# Patient Record
Sex: Male | Born: 1965 | Race: White | Hispanic: No | Marital: Married | Smoking: Current every day smoker
Health system: Southern US, Community
[De-identification: ages and names within clinical notes are randomized; demographics above are authoritative.]

## PROBLEM LIST (undated history)

## (undated) DIAGNOSIS — Z720 Tobacco use: Secondary | ICD-10-CM

## (undated) DIAGNOSIS — Z923 Personal history of irradiation: Secondary | ICD-10-CM

## (undated) DIAGNOSIS — K219 Gastro-esophageal reflux disease without esophagitis: Secondary | ICD-10-CM

## (undated) DIAGNOSIS — F419 Anxiety disorder, unspecified: Secondary | ICD-10-CM

## (undated) DIAGNOSIS — J449 Chronic obstructive pulmonary disease, unspecified: Secondary | ICD-10-CM

## (undated) DIAGNOSIS — J45909 Unspecified asthma, uncomplicated: Secondary | ICD-10-CM

## (undated) DIAGNOSIS — J40 Bronchitis, not specified as acute or chronic: Secondary | ICD-10-CM

## (undated) HISTORY — PX: TONSILLECTOMY: SUR1361

## (undated) HISTORY — DX: Personal history of irradiation: Z92.3

## (undated) HISTORY — PX: ORIF ACETABULAR FRACTURE: SHX5029

---

## 2000-12-18 ENCOUNTER — Ambulatory Visit (HOSPITAL_COMMUNITY): Admission: RE | Admit: 2000-12-18 | Discharge: 2000-12-18 | Payer: Self-pay | Admitting: Pulmonary Disease

## 2001-10-28 ENCOUNTER — Encounter: Payer: Self-pay | Admitting: Orthopaedic Surgery

## 2001-10-28 ENCOUNTER — Ambulatory Visit (HOSPITAL_COMMUNITY): Admission: RE | Admit: 2001-10-28 | Discharge: 2001-10-28 | Payer: Self-pay | Admitting: Orthopaedic Surgery

## 2002-01-17 ENCOUNTER — Ambulatory Visit (HOSPITAL_COMMUNITY): Admission: RE | Admit: 2002-01-17 | Discharge: 2002-01-17 | Payer: Self-pay | Admitting: Pulmonary Disease

## 2002-07-20 ENCOUNTER — Encounter: Payer: Self-pay | Admitting: Emergency Medicine

## 2002-07-20 ENCOUNTER — Emergency Department (HOSPITAL_COMMUNITY): Admission: EM | Admit: 2002-07-20 | Discharge: 2002-07-21 | Payer: Self-pay | Admitting: Emergency Medicine

## 2003-04-07 ENCOUNTER — Ambulatory Visit (HOSPITAL_COMMUNITY): Admission: RE | Admit: 2003-04-07 | Discharge: 2003-04-07 | Payer: Self-pay | Admitting: Pulmonary Disease

## 2003-11-24 ENCOUNTER — Ambulatory Visit (HOSPITAL_COMMUNITY): Admission: RE | Admit: 2003-11-24 | Discharge: 2003-11-24 | Payer: Self-pay | Admitting: Pulmonary Disease

## 2004-01-28 ENCOUNTER — Emergency Department (HOSPITAL_COMMUNITY): Admission: EM | Admit: 2004-01-28 | Discharge: 2004-01-28 | Payer: Self-pay | Admitting: Emergency Medicine

## 2005-03-21 ENCOUNTER — Ambulatory Visit (HOSPITAL_COMMUNITY): Admission: RE | Admit: 2005-03-21 | Discharge: 2005-03-21 | Payer: Self-pay | Admitting: Pulmonary Disease

## 2005-06-26 ENCOUNTER — Ambulatory Visit (HOSPITAL_COMMUNITY): Admission: RE | Admit: 2005-06-26 | Discharge: 2005-06-26 | Payer: Self-pay | Admitting: Pulmonary Disease

## 2006-06-11 ENCOUNTER — Ambulatory Visit (HOSPITAL_COMMUNITY): Admission: RE | Admit: 2006-06-11 | Discharge: 2006-06-11 | Payer: Self-pay | Admitting: Family Medicine

## 2006-10-21 ENCOUNTER — Ambulatory Visit (HOSPITAL_BASED_OUTPATIENT_CLINIC_OR_DEPARTMENT_OTHER): Admission: RE | Admit: 2006-10-21 | Discharge: 2006-10-21 | Payer: Self-pay | Admitting: Orthopedic Surgery

## 2011-11-01 ENCOUNTER — Emergency Department (HOSPITAL_COMMUNITY): Payer: 59

## 2011-11-01 ENCOUNTER — Emergency Department (HOSPITAL_COMMUNITY)
Admission: EM | Admit: 2011-11-01 | Discharge: 2011-11-01 | Disposition: A | Discharge status: Home / Self Care | Payer: 59 | Attending: Emergency Medicine | Admitting: Emergency Medicine

## 2011-11-01 ENCOUNTER — Encounter (HOSPITAL_COMMUNITY): Payer: Self-pay

## 2011-11-01 DIAGNOSIS — S2239XA Fracture of one rib, unspecified side, initial encounter for closed fracture: Secondary | ICD-10-CM | POA: Insufficient documentation

## 2011-11-01 DIAGNOSIS — F17200 Nicotine dependence, unspecified, uncomplicated: Secondary | ICD-10-CM | POA: Insufficient documentation

## 2011-11-01 DIAGNOSIS — R05 Cough: Secondary | ICD-10-CM | POA: Insufficient documentation

## 2011-11-01 DIAGNOSIS — W2203XA Walked into furniture, initial encounter: Secondary | ICD-10-CM | POA: Insufficient documentation

## 2011-11-01 DIAGNOSIS — R071 Chest pain on breathing: Secondary | ICD-10-CM | POA: Insufficient documentation

## 2011-11-01 DIAGNOSIS — R062 Wheezing: Secondary | ICD-10-CM | POA: Insufficient documentation

## 2011-11-01 DIAGNOSIS — S2232XA Fracture of one rib, left side, initial encounter for closed fracture: Secondary | ICD-10-CM

## 2011-11-01 DIAGNOSIS — R059 Cough, unspecified: Secondary | ICD-10-CM | POA: Insufficient documentation

## 2011-11-01 DIAGNOSIS — W19XXXA Unspecified fall, initial encounter: Secondary | ICD-10-CM | POA: Insufficient documentation

## 2011-11-01 HISTORY — DX: Bronchitis, not specified as acute or chronic: J40

## 2011-11-01 NOTE — Discharge Instructions (Signed)
Rib Fracture Your caregiver has diagnosed you as having a rib fracture (a break). This can occur by a blow to the chest, by a fall against a hard object, or by violent coughing or sneezing. There may be one or many breaks. Rib fractures may heal on their own within 3 to 8 weeks. The longer healing period is usually associated with a continued cough or other aggravating activities. HOME CARE INSTRUCTIONS   Avoid strenuous activity. Be careful during activities and avoid bumping the injured rib. Activities that cause pain pull on the fracture site(s) and are best avoided if possible.   Eat a normal, well-balanced diet. Drink plenty of fluids to avoid constipation.   Take deep breaths several times a day to keep lungs free of infection. Try to cough several times a day, splinting the injured area with a pillow. This will help prevent pneumonia.   Do not wear a rib belt or binder. These restrict breathing which can lead to pneumonia.   Only take over-the-counter or prescription medicines for pain, discomfort, or fever as directed by your caregiver.  SEEK MEDICAL CARE IF:  You develop a continual cough, associated with thick or bloody sputum. SEEK IMMEDIATE MEDICAL CARE IF:   You have a fever.   You have difficulty breathing.   You have nausea (feeling sick to your stomach), vomiting, or abdominal (belly) pain.   You have worsening pain, not controlled with medications.   You have dizziness, faint, bloody urine, bloody stools, cough up blood, or other concerns. Document Released: 08/11/2005 Document Revised: 07/31/2011 Document Reviewed: 01/13/2007 [employer] Patient Information 2012 [place], [place].

## 2011-11-01 NOTE — ED Provider Notes (Signed)
History   This chart was scribed for Hurman Horn, MD by Melba Coon. The patient was seen in room APA06/APA06 and the patient's care was started at 10:40PM.     CSN: [id number]  Arrival date & time 11/01/11  1001   First MD Initiated Contact with Patient 11/01/11 1038      Chief Complaint  Patient presents with  . Chest Injury    (Consider location/radiation/quality/duration/timing/severity/associated sxs/prior treatment) HPI James Serrano is a 46 y.o. male who presents to the Emergency Department complaining of constant, moderate to severe left-sided chest pain around the left ribs with an onset this morning pertaining to a fall. Pt was told he had pneumonia 4 weeks ago and is on his second round of antibiotics. Pt has also had prednisone. This morning when pt got out of bed, he was coughing so hard that he accidentally hit his chest on a dresser; no LOC. Pt states it hurts to breathe but there is no SOB. No HA, neck pain, back pain, abd pain, or extremity, numbness, tingling, or edema. No known allergies. No other pertinent medical problems.  Past Medical History  Diagnosis Date  . Bronchitis     History reviewed. No pertinent past surgical history.  No family history on file.  History  Substance Use Topics  . Smoking status: Current Everyday Smoker  . Smokeless tobacco: Not on file  . Alcohol Use: Yes     occasionally     Review of Systems 10 Systems reviewed and are negative for acute change except as noted in the HPI.  Allergies  Review of patient's allergies indicates no known allergies.  Home Medications   Current Outpatient Rx  Name Route Sig Dispense Refill  . ALBUTEROL SULFATE HFA 108 (90 BASE) MCG/ACT IN AERS Inhalation Inhale 2 puffs into the lungs every 6 (six) hours as needed. For shortness of breath    . LEVOFLOXACIN 500 MG PO TABS Oral Take 500 mg by mouth daily.      BP 134/83  Pulse 73  Temp(Src) 98.1 F (36.7 C) (Oral)  Resp 21  Ht 6'  (1.829 m)  Wt 260 lb (117.935 kg)  BMI 35.26 kg/m2  SpO2 97%  Physical Exam  Nursing note and vitals reviewed. Constitutional: He is oriented to person, place, and time. He appears well-developed and well-nourished. No distress.       Awake, alert, nontoxic appearance. Pt is standing at time of exam.  HENT:  Head: Normocephalic and atraumatic.  Eyes: EOM are normal. Pupils are equal, round, and reactive to light. Right eye exhibits no discharge. Left eye exhibits no discharge.  Neck: Normal range of motion. Neck supple.  Cardiovascular: Normal rate, regular rhythm and normal heart sounds.   Pulmonary/Chest: Effort normal. He has wheezes (End-expiratory; bilaterally at the bases). He exhibits no tenderness.  Abdominal: Soft. Bowel sounds are normal. There is no tenderness. There is no rebound and no guarding.  Musculoskeletal: Normal range of motion. He exhibits tenderness (Tenderness in the left parasternal, lower anterior chest wall without deformity. ). He exhibits no edema.       Baseline ROM, no obvious new focal weakness.  Neurological: He is alert and oriented to person, place, and time.       Mental status and motor strength appears baseline for patient and situation.  Skin: Skin is warm and dry. No rash noted.  Psychiatric: He has a normal mood and affect. His behavior is normal.    ED Course  Procedures (including critical care time)  DIAGNOSTIC STUDIES: Oxygen Saturation is 97% on room air, normal by my interpretation.    COORDINATION OF CARE:  10:45AM - EDMD will order CXR to exclude collapsed lung.  Labs Reviewed - No data to display Dg Chest 2 View  11/01/2011  *RADIOLOGY REPORT*  Clinical Data: 46 year old male with chest pain following injury.  CHEST - 2 VIEW  Comparison: 06/26/2005  Findings: The cardiomediastinal silhouette is unremarkable. COPD/emphysema noted. There is no evidence of focal airspace disease, pulmonary edema, suspicious pulmonary nodule/mass,  pleural effusion, or pneumothorax. No acute bony abnormalities are identified.  IMPRESSION: COPD/emphysema without evidence of acute cardiopulmonary disease.  Original Report Authenticated By: Rosendo Gros, M.D.     1. Left rib fracture       MDM  I personally performed the services described in this documentation, which was scribed in my presence. The recorded information has been reviewed and considered.       Hurman Horn, MD 11/01/11 2034

## 2011-11-01 NOTE — ED Notes (Addendum)
edp in for evaluation

## 2011-11-01 NOTE — ED Notes (Signed)
Pt reports reports had pneumonia 4 weeks ago  And is on his second round of antibiotics.  Reports pt had a coughing spell and was dizzy when he got out of bed.  Says he lost his balance and fell into a dresser.  Pt c/o pain in left ribs.    Says did not lose consciousness.

## 2013-02-13 ENCOUNTER — Other Ambulatory Visit: Payer: Self-pay | Admitting: Family Medicine

## 2013-02-15 ENCOUNTER — Encounter: Payer: Self-pay | Admitting: *Deleted

## 2013-04-01 ENCOUNTER — Encounter: Payer: Self-pay | Admitting: Family Medicine

## 2013-04-01 ENCOUNTER — Ambulatory Visit (INDEPENDENT_AMBULATORY_CARE_PROVIDER_SITE_OTHER): Payer: BC Managed Care – PPO | Admitting: Family Medicine

## 2013-04-01 VITALS — BP 130/90 | HR 80 | Ht 72.0 in | Wt 262.0 lb

## 2013-04-01 DIAGNOSIS — R05 Cough: Secondary | ICD-10-CM

## 2013-04-01 DIAGNOSIS — Z0000 Encounter for general adult medical examination without abnormal findings: Secondary | ICD-10-CM

## 2013-04-01 DIAGNOSIS — R059 Cough, unspecified: Secondary | ICD-10-CM

## 2013-04-01 MED ORDER — BUPROPION HCL ER (SR) 150 MG PO TB12: 150.0000 mg | ORAL_TABLET | Freq: Two times a day (BID) | ORAL | Status: DC

## 2013-04-01 MED ORDER — ALBUTEROL SULFATE HFA 108 (90 BASE) MCG/ACT IN AERS: INHALATION_SPRAY | RESPIRATORY_TRACT | Status: DC

## 2013-04-01 NOTE — Patient Instructions (Signed)
DASH Diet  The DASH diet stands for "Dietary Approaches to Stop Hypertension." It is a healthy eating plan that has been shown to reduce high blood pressure (hypertension) in as little as 14 days, while also possibly providing other significant health benefits. These other health benefits include reducing the risk of breast cancer after menopause and reducing the risk of type 2 diabetes, heart disease, colon cancer, and stroke. Health benefits also include weight loss and slowing kidney failure in patients with chronic kidney disease.   DIET GUIDELINES  · Limit salt (sodium). Your diet should contain less than 1500 mg of sodium daily.  · Limit refined or processed carbohydrates. Your diet should include mostly whole grains. Desserts and added sugars should be used sparingly.  · Include small amounts of heart-healthy fats. These types of fats include nuts, oils, and tub margarine. Limit saturated and trans fats. These fats have been shown to be harmful in the body.  CHOOSING FOODS   The following food groups are based on a 2000 calorie diet. See your Registered Dietitian for individual calorie needs.  Grains and Grain Products (6 to 8 servings daily)  · Eat More Often: Whole-wheat bread, brown rice, whole-grain or wheat pasta, quinoa, popcorn without added fat or salt (air popped).  · Eat Less Often: White bread, white pasta, white rice, cornbread.  Vegetables (4 to 5 servings daily)  · Eat More Often: Fresh, frozen, and canned vegetables. Vegetables may be raw, steamed, roasted, or grilled with a minimal amount of fat.  · Eat Less Often/Avoid: Creamed or fried vegetables. Vegetables in a cheese sauce.  Fruit (4 to 5 servings daily)  · Eat More Often: All fresh, canned (in natural juice), or frozen fruits. Dried fruits without added sugar. One hundred percent fruit juice (½ cup [237 mL] daily).  · Eat Less Often: Dried fruits with added sugar. Canned fruit in light or heavy syrup.  Lean Meats, Fish, and Poultry (2  servings or less daily. One serving is 3 to 4 oz [85-114 g]).  · Eat More Often: Ninety percent or leaner ground beef, tenderloin, sirloin. Round cuts of beef, chicken breast, turkey breast. All fish. Grill, bake, or broil your meat. Nothing should be fried.  · Eat Less Often/Avoid: Fatty cuts of meat, turkey, or chicken leg, thigh, or wing. Fried cuts of meat or fish.  Dairy (2 to 3 servings)  · Eat More Often: Low-fat or fat-free milk, low-fat plain or light yogurt, reduced-fat or part-skim cheese.  · Eat Less Often/Avoid: Milk (whole, 2%). Whole milk yogurt. Full-fat cheeses.  Nuts, Seeds, and Legumes (4 to 5 servings per week)  · Eat More Often: All without added salt.  · Eat Less Often/Avoid: Salted nuts and seeds, canned beans with added salt.  Fats and Sweets (limited)  · Eat More Often: Vegetable oils, tub margarines without trans fats, sugar-free gelatin. Mayonnaise and salad dressings.  · Eat Less Often/Avoid: Coconut oils, palm oils, butter, stick margarine, cream, half and half, cookies, candy, pie.  FOR MORE INFORMATION  The Dash Diet Eating Plan: www.dashdiet.org  Document Released: 07/31/2011 Document Revised: 11/03/2011 Document Reviewed: 07/31/2011  [employer]® Patient Information ©2014 [employer].

## 2013-04-01 NOTE — Progress Notes (Signed)
  Subjective:    Patient ID: James Serrano, male    DOB: 04-Nov-1965, 47 y.o.   MRN: [id number]  HPI Patient is here today for his annual wellness exam. He states that his overall health is doing very well. He wants to discuss getting a medication to help with his nerves. Patient admits that he can do a better job at eating healthier. He also states to be more active. He does get some shortness of breath with activity. Denies chest pressure tightness pain patient has extensive smoking history back in 2007 he had a CAT scan which showed a small pulmonary nodule but it was stable compared one in 2006 he does have some coughing no hemoptysis. No  FMH of colon or prostate cancer Dad with lung cancer Pt tries chantix without help had side effects Has some cough Smoking history    She denies rectal bleeding. Family history reviewed Review of Systems  Constitutional: Negative for fever, activity change and appetite change.  HENT: Negative for congestion, rhinorrhea and neck pain.   Eyes: Negative for discharge.  Respiratory: Positive for cough. Negative for wheezing.   Cardiovascular: Negative for chest pain.  Gastrointestinal: Negative for vomiting, abdominal pain and blood in stool.  Genitourinary: Negative for frequency and difficulty urinating.  Skin: Negative for rash.  Allergic/Immunologic: Negative for environmental allergies and food allergies.  Neurological: Negative for weakness and headaches.  Psychiatric/Behavioral: Negative for agitation.      see above Objective:   Physical Exam  Vitals reviewed. Constitutional: He appears well-developed and well-nourished.  HENT:  Head: Normocephalic and atraumatic.  Right Ear: External ear normal.  Left Ear: External ear normal.  Nose: Nose normal.  Mouth/Throat: Oropharynx is clear and moist.  Eyes: EOM are normal. Pupils are equal, round, and reactive to light.  Neck: Normal range of motion. Neck supple. No thyromegaly present.   Cardiovascular: Normal rate, regular rhythm and normal heart sounds.   No murmur heard. Pulmonary/Chest: Effort normal and breath sounds normal. No respiratory distress. He has no wheezes.  Abdominal: Soft. Bowel sounds are normal. He exhibits no distension and no mass. There is no tenderness.  Genitourinary: Penis normal.  Musculoskeletal: Normal range of motion. He exhibits no edema.  Lymphadenopathy:    He has no cervical adenopathy.  Neurological: He is alert. He exhibits normal muscle tone.  Skin: Skin is warm and dry. No erythema.  Psychiatric: He has a normal mood and affect. His behavior is normal. Judgment normal.          Assessment & Plan:  #1 probable COPD. I told the patient best he could use quit smoking. Albuterol as needed. If breathing problems become worse next step is full pulmonary function testing #2 cough-probably at true smokers cough chest x-ray warned to it. Probably when patient turns 48 he will need a CT scan #3 smoking cessation discussed multiple ways of doing this he will go with Wellbutrin SR twice daily to see if this will help his stress levels plus also help him quit smoking #4 overall wellness good safety measures discussed patient encouraged to lose weight exercise watch diet quit smoking he would check his lab work await the results

## 2013-04-07 ENCOUNTER — Telehealth: Payer: Self-pay | Admitting: Family Medicine

## 2013-04-07 NOTE — Telephone Encounter (Signed)
Left message on voicemail notifying patient that medications were sent in last week and they were sent to [facility] pharmacy.

## 2013-04-07 NOTE — Telephone Encounter (Signed)
Patient came in last week and was supposed to have medications called in, but pharmacy states they do not have it.    [facility] in [place]

## 2013-05-30 ENCOUNTER — Encounter: Payer: Self-pay | Admitting: Family Medicine

## 2013-05-30 ENCOUNTER — Telehealth: Payer: Self-pay | Admitting: Family Medicine

## 2013-05-30 ENCOUNTER — Ambulatory Visit (INDEPENDENT_AMBULATORY_CARE_PROVIDER_SITE_OTHER): Payer: BC Managed Care – PPO | Admitting: Family Medicine

## 2013-05-30 VITALS — BP 128/86 | Temp 98.4°F | Ht 72.0 in | Wt 262.8 lb

## 2013-05-30 DIAGNOSIS — J209 Acute bronchitis, unspecified: Secondary | ICD-10-CM

## 2013-05-30 MED ORDER — LEVOFLOXACIN 500 MG PO TABS: 500.0000 mg | ORAL_TABLET | Freq: Every day | ORAL | Status: AC

## 2013-05-30 MED ORDER — PREDNISONE 20 MG PO TABS: ORAL_TABLET | ORAL | Status: AC

## 2013-05-30 NOTE — Progress Notes (Signed)
  Subjective:    Patient ID: James Serrano, male    DOB: 05/25/1966, 47 y.o.   MRN: [id number]  HPI Patient arrives with cough, headache, body aches and wheezing for few days. Symptoms been going on for a few days. Denies shortness of breath. He does smoke he knows needs quit. He has been using his inhaler. He relates cough congestion no vomiting or diarrhea Has had some history of reactive airway Review of Systems See above    Objective:   Physical Exam  Lungs bilateral expiratory wheezes not respiratory distress heart regular abdomen soft extremities no edema skin warm dry      Assessment & Plan:  Reactive airway-albuterol when necessary prednisone taper #2 bronchitis Levaquin 10 days Educated patient regarding warning signs of pneumonia if worse followup immediately

## 2013-05-30 NOTE — Telephone Encounter (Signed)
Left message on voicemail notifying patient that xray order is still in the system and can report to [facility] to have this completed.

## 2013-05-30 NOTE — Telephone Encounter (Signed)
Patient had a past xray from August-but never got order filled. He would like to get paperwork for this.

## 2013-09-21 ENCOUNTER — Telehealth: Payer: Self-pay | Admitting: Family Medicine

## 2013-09-21 MED ORDER — CEPHALEXIN 500 MG PO CAPS: 500.0000 mg | ORAL_CAPSULE | Freq: Four times a day (QID) | ORAL | Status: DC

## 2013-09-21 NOTE — Telephone Encounter (Signed)
Please call in to [facility]

## 2013-09-21 NOTE — Telephone Encounter (Signed)
Use keflex 500 one qid for 7 days He should schedule ov for tag removal in the near future

## 2013-09-21 NOTE — Telephone Encounter (Signed)
Talked with wife. Advised to come in for appt. He is working out of town til Friday. Wife states it is not infected, no fever. Skin tag is hanging off. Area is red around the sight.

## 2013-09-21 NOTE — Telephone Encounter (Signed)
Med sent to pharm. Wife notified.

## 2013-09-21 NOTE — Telephone Encounter (Signed)
Pt's wife to call back with name of pharm to call in antibiotic and to schedule appt for skin tag removal.

## 2013-09-21 NOTE — Telephone Encounter (Signed)
Patient has got skin tags. 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 His wife is calling for advice on how to care for this.

## 2013-09-30 ENCOUNTER — Encounter: Payer: Self-pay | Admitting: Family Medicine

## 2013-09-30 ENCOUNTER — Ambulatory Visit (INDEPENDENT_AMBULATORY_CARE_PROVIDER_SITE_OTHER): Payer: BC Managed Care – PPO | Admitting: Family Medicine

## 2013-09-30 VITALS — BP 122/78 | Ht 72.0 in | Wt 268.0 lb

## 2013-09-30 DIAGNOSIS — L919 Hypertrophic disorder of the skin, unspecified: Secondary | ICD-10-CM

## 2013-09-30 DIAGNOSIS — L918 Other hypertrophic disorders of the skin: Secondary | ICD-10-CM

## 2013-09-30 DIAGNOSIS — L909 Atrophic disorder of skin, unspecified: Secondary | ICD-10-CM

## 2013-09-30 MED ORDER — CIPROFLOXACIN HCL 500 MG PO TABS: 500.0000 mg | ORAL_TABLET | Freq: Two times a day (BID) | ORAL | Status: AC

## 2013-09-30 NOTE — Progress Notes (Signed)
   Subjective:    Patient ID: James Serrano, male    DOB: 03/20/1966, 48 y.o.   MRN: [id number]  HPI Patient arrives with a skin tag in groin area. Patient wears safety harness at work and it irritated the skin tag and now it has turned black and is sore. Denies any per previous trouble. He has had this for several years. It's just gotten larger with time and now gets in the way.  Review of Systems No fevers no drainage    Objective:   Physical Exam  There is a large pedunculated skin tag growth. Does not appear cancerous. Does have some localized infection. No abscess seen.  With his consent this area was numbed with lidocaine then was removed with a #15 blade. 2 sutures were placed in sterile fashion.      Assessment & Plan:  Pedunculated skin tag removal. Followup 10 days for suture removal.  Localized infection antibiotics prescribed warning signs discussed

## 2013-10-10 ENCOUNTER — Ambulatory Visit: Payer: BC Managed Care – PPO | Admitting: Family Medicine

## 2013-10-10 ENCOUNTER — Encounter: Payer: Self-pay | Admitting: Family Medicine

## 2013-10-10 VITALS — BP 130/80 | Ht 72.0 in | Wt 266.4 lb

## 2013-10-10 DIAGNOSIS — J069 Acute upper respiratory infection, unspecified: Secondary | ICD-10-CM

## 2013-10-10 MED ORDER — AZITHROMYCIN 250 MG PO TABS: ORAL_TABLET | ORAL | Status: DC

## 2013-10-10 NOTE — Progress Notes (Signed)
   Subjective:    Patient ID: James Serrano, male    DOB: [birth year], 48 y.o.   MRN: [id number]  HPI Patient is here today for suture removal on a skin tag that was removed on 09/30/2013.   Patient states that he has yellow colored congestion and cough that has been present for 2 days now.  He denies high fevers wheezing difficulty breathing he just relates congestion cough sinus pressure symptoms over the past few days.  Review of Systems No other problems.    Objective:   Physical Exam Nostrils normal throat normal neck supple lungs clear  Area on the left leg were skin tag was removed looks fine small suture removed without difficulty     Assessment & Plan:  Result surgical site.  Viral upper rest revealed [place] if progressive over the next couple days Zithromax 5 days as directed warnings discussed patient was encouraged to quit smoking, also encouraged to get lab work in wellness exam later this year

## 2013-10-12 ENCOUNTER — Telehealth: Payer: Self-pay | Admitting: Family Medicine

## 2013-10-12 MED ORDER — BENZONATATE 200 MG PO CAPS: 200.0000 mg | ORAL_CAPSULE | Freq: Three times a day (TID) | ORAL | Status: DC | PRN

## 2013-10-12 NOTE — Telephone Encounter (Signed)
Patient was seen on 2/13 but cant seem to get over bad cough. He has tried over the counter cough medicine not helping can you prescribe a cough medicine. Call into [facility] [place].

## 2013-10-12 NOTE — Addendum Note (Signed)
Addended by: Carmelina Noun on: 10/12/2013 01:27 PM   Modules accepted: Orders

## 2013-10-12 NOTE — Telephone Encounter (Signed)
Tessalon 200 mg 1 3 times a day when necessary cough, #21, followup if ongoing

## 2013-10-12 NOTE — Telephone Encounter (Signed)
Med sent to pharm. Pt notified on voicemail.  

## 2013-12-02 ENCOUNTER — Telehealth: Payer: Self-pay | Admitting: Family Medicine

## 2013-12-02 NOTE — Telephone Encounter (Signed)
Chart in message pile

## 2013-12-02 NOTE — Telephone Encounter (Signed)
I will need his chart.

## 2013-12-02 NOTE — Telephone Encounter (Signed)
Patients life insurance policy is requiring that we compile a list of medications that patient has been on in 2014 and 2015 and why he was on those medications.

## 2013-12-06 NOTE — Telephone Encounter (Signed)
Patients wife called about this today. She needs this ASAP.

## 2013-12-07 NOTE — Telephone Encounter (Signed)
A letter was dictated regarding all of this please give a copy to the patient.

## 2013-12-07 NOTE — Telephone Encounter (Signed)
A letter was dictated regarding all of this

## 2013-12-08 NOTE — Telephone Encounter (Signed)
Notified patients wife that it was ready.

## 2014-02-12 ENCOUNTER — Emergency Department (HOSPITAL_COMMUNITY): Payer: BC Managed Care – PPO

## 2014-02-12 ENCOUNTER — Encounter (HOSPITAL_COMMUNITY): Payer: Self-pay | Admitting: Emergency Medicine

## 2014-02-12 ENCOUNTER — Observation Stay (HOSPITAL_COMMUNITY)
Admission: EM | Admit: 2014-02-12 | Discharge: 2014-02-13 | Disposition: A | Discharge status: Home / Self Care | Payer: BC Managed Care – PPO | Attending: Internal Medicine | Admitting: Internal Medicine

## 2014-02-12 DIAGNOSIS — F1910 Other psychoactive substance abuse, uncomplicated: Secondary | ICD-10-CM | POA: Insufficient documentation

## 2014-02-12 DIAGNOSIS — F17200 Nicotine dependence, unspecified, uncomplicated: Secondary | ICD-10-CM | POA: Insufficient documentation

## 2014-02-12 DIAGNOSIS — Z79899 Other long term (current) drug therapy: Secondary | ICD-10-CM | POA: Insufficient documentation

## 2014-02-12 DIAGNOSIS — R197 Diarrhea, unspecified: Secondary | ICD-10-CM | POA: Insufficient documentation

## 2014-02-12 DIAGNOSIS — R42 Dizziness and giddiness: Secondary | ICD-10-CM | POA: Insufficient documentation

## 2014-02-12 DIAGNOSIS — M6281 Muscle weakness (generalized): Secondary | ICD-10-CM | POA: Insufficient documentation

## 2014-02-12 DIAGNOSIS — R5383 Other fatigue: Secondary | ICD-10-CM

## 2014-02-12 DIAGNOSIS — Z720 Tobacco use: Secondary | ICD-10-CM

## 2014-02-12 DIAGNOSIS — R5381 Other malaise: Secondary | ICD-10-CM | POA: Insufficient documentation

## 2014-02-12 DIAGNOSIS — K219 Gastro-esophageal reflux disease without esophagitis: Secondary | ICD-10-CM

## 2014-02-12 DIAGNOSIS — R209 Unspecified disturbances of skin sensation: Secondary | ICD-10-CM | POA: Insufficient documentation

## 2014-02-12 DIAGNOSIS — J441 Chronic obstructive pulmonary disease with (acute) exacerbation: Secondary | ICD-10-CM | POA: Insufficient documentation

## 2014-02-12 DIAGNOSIS — R0789 Other chest pain: Principal | ICD-10-CM | POA: Insufficient documentation

## 2014-02-12 DIAGNOSIS — R112 Nausea with vomiting, unspecified: Secondary | ICD-10-CM | POA: Insufficient documentation

## 2014-02-12 DIAGNOSIS — J449 Chronic obstructive pulmonary disease, unspecified: Secondary | ICD-10-CM

## 2014-02-12 DIAGNOSIS — R079 Chest pain, unspecified: Secondary | ICD-10-CM

## 2014-02-12 DIAGNOSIS — F1010 Alcohol abuse, uncomplicated: Secondary | ICD-10-CM

## 2014-02-12 HISTORY — DX: Tobacco use: Z72.0

## 2014-02-12 LAB — CBC WITH DIFFERENTIAL/PLATELET
Basophils Absolute: 0 10*3/uL (ref 0.0–0.1)
Basophils Relative: 0 % (ref 0–1)
Eosinophils Absolute: 0.3 10*3/uL (ref 0.0–0.7)
Eosinophils Relative: 5 % (ref 0–5)
HCT: 48.2 % (ref 39.0–52.0)
Hemoglobin: 16.9 g/dL (ref 13.0–17.0)
Lymphocytes Relative: 26 % (ref 12–46)
Lymphs Abs: 1.4 10*3/uL (ref 0.7–4.0)
MCH: 33.1 pg (ref 26.0–34.0)
MCHC: 35.1 g/dL (ref 30.0–36.0)
MCV: 94.3 fL (ref 78.0–100.0)
Monocytes Absolute: 0.5 10*3/uL (ref 0.1–1.0)
Monocytes Relative: 8 % (ref 3–12)
Neutro Abs: 3.2 10*3/uL (ref 1.7–7.7)
Neutrophils Relative %: 61 % (ref 43–77)
Platelets: 166 10*3/uL (ref 150–400)
RBC: 5.11 MIL/uL (ref 4.22–5.81)
RDW: 12.6 % (ref 11.5–15.5)
WBC: 5.4 10*3/uL (ref 4.0–10.5)

## 2014-02-12 LAB — TROPONIN I
Troponin I: <0.3 ng/mL (ref <0.30)
Troponin I: <0.3 ng/mL (ref <0.30)
Troponin I: <0.3 ng/mL (ref <0.30)

## 2014-02-12 LAB — BASIC METABOLIC PANEL
BUN: 8 mg/dL (ref 6–23)
CO2: 27 mEq/L (ref 19–32)
Calcium: 9.6 mg/dL (ref 8.4–10.5)
Chloride: 99 mEq/L (ref 96–112)
Creatinine, Ser: 0.84 mg/dL (ref 0.50–1.35)
GFR calc Af Amer: >90 mL/min (ref >90)
GFR calc non Af Amer: >90 mL/min (ref >90)
Glucose, Bld: 106 mg/dL — ABNORMAL HIGH (ref 70–99)
Potassium: 4.3 mEq/L (ref 3.7–5.3)
Sodium: 139 mEq/L (ref 137–147)

## 2014-02-12 LAB — CBC
HCT: 48.1 % (ref 39.0–52.0)
Hemoglobin: 16.9 g/dL (ref 13.0–17.0)
MCH: 33.5 pg (ref 26.0–34.0)
MCHC: 35.1 g/dL (ref 30.0–36.0)
MCV: 95.4 fL (ref 78.0–100.0)
Platelets: 167 10*3/uL (ref 150–400)
RBC: 5.04 MIL/uL (ref 4.22–5.81)
RDW: 12.7 % (ref 11.5–15.5)
WBC: 5.9 10*3/uL (ref 4.0–10.5)

## 2014-02-12 LAB — TSH: TSH: 1.26 u[IU]/mL (ref 0.350–4.500)

## 2014-02-12 LAB — MAGNESIUM: Magnesium: 2.3 mg/dL (ref 1.5–2.5)

## 2014-02-12 LAB — PHOSPHORUS: Phosphorus: 3.6 mg/dL (ref 2.3–4.6)

## 2014-02-12 MED ORDER — ZOLPIDEM TARTRATE 5 MG PO TABS
5.0000 mg | ORAL_TABLET | Freq: Once | ORAL | Status: AC
  Administered 2014-02-12: 5 mg via ORAL
  Filled 2014-02-12: qty 1

## 2014-02-12 MED ORDER — SIMETHICONE 80 MG PO CHEW
160.0000 mg | CHEWABLE_TABLET | Freq: Four times a day (QID) | ORAL | Status: DC | PRN
  Administered 2014-02-12 – 2014-02-13 (×2): 160 mg via ORAL
  Filled 2014-02-12 (×2): qty 2

## 2014-02-12 MED ORDER — BUPROPION HCL ER (SR) 150 MG PO TB12
150.0000 mg | ORAL_TABLET | Freq: Two times a day (BID) | ORAL | Status: DC
  Filled 2014-02-12 (×6): qty 1

## 2014-02-12 MED ORDER — FAMOTIDINE IN NACL 20-0.9 MG/50ML-% IV SOLN
20.0000 mg | Freq: Once | INTRAVENOUS | Status: AC
  Administered 2014-02-12: 20 mg via INTRAVENOUS
  Filled 2014-02-12: qty 50

## 2014-02-12 MED ORDER — GI COCKTAIL ~~LOC~~
30.0000 mL | Freq: Once | ORAL | Status: AC
  Administered 2014-02-12: 30 mL via ORAL
  Filled 2014-02-12: qty 30

## 2014-02-12 MED ORDER — FAMOTIDINE 20 MG PO TABS
20.0000 mg | ORAL_TABLET | Freq: Two times a day (BID) | ORAL | Status: DC
  Administered 2014-02-12 – 2014-02-13 (×2): 20 mg via ORAL
  Filled 2014-02-12 (×2): qty 1

## 2014-02-12 MED ORDER — ASPIRIN EC 81 MG PO TBEC
81.0000 mg | DELAYED_RELEASE_TABLET | Freq: Every day | ORAL | Status: DC
  Administered 2014-02-12 – 2014-02-13 (×2): 81 mg via ORAL
  Filled 2014-02-12 (×2): qty 1

## 2014-02-12 MED ORDER — ENOXAPARIN SODIUM 40 MG/0.4ML ~~LOC~~ SOLN
40.0000 mg | SUBCUTANEOUS | Status: DC
  Administered 2014-02-12: 40 mg via SUBCUTANEOUS
  Filled 2014-02-12: qty 0.4

## 2014-02-12 MED ORDER — ALBUTEROL SULFATE (2.5 MG/3ML) 0.083% IN NEBU
3.0000 mL | INHALATION_SOLUTION | RESPIRATORY_TRACT | Status: DC | PRN
  Administered 2014-02-12: 3 mL via RESPIRATORY_TRACT
  Filled 2014-02-12: qty 3

## 2014-02-12 NOTE — H&P (Signed)
[facility] History and Physical  James Serrano:[id number] DOB: 1965/12/25 DOA: 02/12/2014  Referring physician:  PCP: James Lange, MD  Specialists:   Chief Complaint: Chest pain.  HPI: James Serrano is a 48 y.o. male  With a history of COPD, nicotine abuse that presents to the emergency department with complaints of chest pain. Patient states his chest pain started yesterday evening and has been intermittent. Patient states that the pain is left-sided and penetrates straight to his shoulder blade. The pain lasts a few seconds. It is associated with some nausea. Patient denies any diaphoresis, vomiting, dizziness. Patient does have some shortness of breath but states that this is constant for him. Patient states that the pain is pressure-like in nature and rates as a 5/10 at its worst. Patient has not tried taking any aspirin for his pain. The pain usually subsides on its own. The pain did awaken him from sleep yesterday evening. Patient thought that his pain was related to gas and indigestion.  Patient states his father passed away at age 29 from a heart attack. Patient does admit to smoking approximately one pack of cigarettes per day. Patient denies any recent ill contacts or travel.  Review of Systems:  Constitutional: Denies fever, chills, diaphoresis, appetite change and fatigue.  HEENT: Denies photophobia, eye pain, redness, hearing loss, ear pain, congestion, sore throat, rhinorrhea, sneezing, mouth sores, trouble swallowing, neck pain, neck stiffness and tinnitus.   Respiratory: Patient has some shortness of breath, however constant.  Cardiovascular: Patient complains of chest pain. Gastrointestinal: Complains of nausea associated with his chest pain. Genitourinary: Denies dysuria, urgency, frequency, hematuria, flank pain and difficulty urinating.  Musculoskeletal: Denies myalgias, back pain, joint swelling, arthralgias and gait problem.  Skin: Denies pallor, rash and  wound.  Neurological: Denies dizziness, seizures, syncope, weakness, light-headedness, numbness and headaches.  Hematological: Denies adenopathy. Easy bruising, personal or family bleeding history  Psychiatric/Behavioral: Denies suicidal ideation, mood changes, confusion, nervousness, sleep disturbance and agitation  Past Medical History  Diagnosis Date  . Bronchitis    History reviewed. No pertinent past surgical history. Social History:  reports that he has been smoking Cigarettes.  He has been smoking about 1.00 pack per day. He does not have any smokeless tobacco history on file. He reports that he drinks alcohol. He reports that he does not use illicit drugs.   No Known Allergies  Family History  Father died in his 97s of an MI.  Brother died of some type of cancer, possibly bone.  Prior to Admission medications   Medication Sig Start Date End Date Taking? Authorizing Provider  albuterol (VENTOLIN HFA) 108 (90 BASE) MCG/ACT inhaler INHALE TWO PUFFS INTO LUNGS EVERY 4 HOURS AS NEEDED FOR  WHEEZING 04/01/13   Kathyrn Drown, MD  azithromycin (ZITHROMAX Z-PAK) 250 MG tablet Take 2 tablets (500 mg) on  Day 1,  followed by 1 tablet (250 mg) once daily on Days 2 through 5. 10/10/13   Kathyrn Drown, MD  benzonatate (TESSALON) 200 MG capsule Take 1 capsule (200 mg total) by mouth 3 (three) times daily as needed for cough. 10/12/13   Kathyrn Drown, MD  buPROPion (WELLBUTRIN SR) 150 MG 12 hr tablet Take 1 tablet (150 mg total) by mouth 2 (two) times daily. 04/01/13 04/01/14  Kathyrn Drown, MD   Physical Exam: Filed Vitals:   02/12/14 1300  BP: 125/87  Pulse: 69  Temp:   Resp: 15     General: Well developed, well nourished,  NAD, appears stated age  25: NCAT, PERRLA, EOMI, Anicteic Sclera, mucous membranes moist.   Neck: Supple, no JVD, no masses  Cardiovascular: S1 S2 auscultated, no rubs, murmurs or gallops. Regular rate and rhythm.  Respiratory: Diffuse wheezing.  Abdomen:  Soft, obese, nontender, nondistended, + bowel sounds  Extremities: warm dry without cyanosis clubbing or edema  Neuro: AAOx3, cranial nerves grossly intact. Strength 5/5 in patient's upper and lower extremities bilaterally  Skin: Without rashes exudates or nodules  Psych: Normal affect and demeanor with intact judgement and insight  Labs on Admission:  Basic Metabolic Panel:  Recent Labs Lab 02/12/14 1131  NA 139  K 4.3  CL 99  CO2 27  GLUCOSE 106*  BUN 8  CREATININE 0.84  CALCIUM 9.6   Liver Function Tests: No results found for this basename: AST, ALT, ALKPHOS, BILITOT, PROT, ALBUMIN,  in the last 168 hours No results found for this basename: LIPASE, AMYLASE,  in the last 168 hours No results found for this basename: AMMONIA,  in the last 168 hours CBC:  Recent Labs Lab 02/12/14 1131  WBC 5.4  NEUTROABS 3.2  HGB 16.9  HCT 48.2  MCV 94.3  PLT 166   Cardiac Enzymes:  Recent Labs Lab 02/12/14 1131  TROPONINI <0.30    BNP (last 3 results) No results found for this basename: PROBNP,  in the last 8760 hours CBG: No results found for this basename: GLUCAP,  in the last 168 hours  Radiological Exams on Admission: Dg Chest 2 View  02/12/2014   CLINICAL DATA:  Chest pain for 2 days, smoking history  EXAM: CHEST  2 VIEW  COMPARISON:  11/01/2011  FINDINGS: Hyperinflation consistent with COPD. Heart size upper normal and stable. Vascular pattern normal. No consolidation effusion or pneumothorax.  IMPRESSION: COPD with no acute findings   Electronically Signed   By: Skipper Cliche M.D.   On: 02/12/2014 11:47    EKG: Independently reviewed. Sinus rhythm, rate 71  Assessment/Plan  Chest pain rule out acute coronary syndrome -Patient be admitted to telemetry unit for observation -Will cycle cardiac troponins -Will place patient on daily aspirin -Patient is obese, smoker, has family history of coronary artery disease, making him high risk for coronary artery  disease -Will consult cardiology for further intervention and management and possibly stress testing -Will obtain TSH, magnesium, phosphate, fasting lipid panel  Nicotine abuse -Patient counseled on smoking cessation  COPD -Currently not in exacerbation -Chest x-ray shows COPD with no acute findings  Alcohol abuse -Patient drinks 1 sixpack per night -Patient was counseled -Will monitor for any signs of withdrawal.  DVT prophylaxis: Lovenox  Code Status: Full  Condition: Guarded  Family Communication: Wife at bedside. Admission, patients condition and plan of care including tests being ordered have been discussed with the patient and wife who indicate understanding and agree with the plan and Code Status.  Disposition Plan: Admitted for observation  Time spent: 60 minutes  MIKHAIL, MARYANN D.O. [facility] Pager [phone]  If 7PM-7AM, please contact night-coverage [email or web address] Password [email or web address] 02/12/2014, 1:10 PM

## 2014-02-12 NOTE — ED Provider Notes (Signed)
CSN: [id number]     Arrival date & time 02/12/14  1108 History   First MD Initiated Contact with Patient 02/12/14 1117     Chief Complaint  Patient presents with  . Chest Pain   Patient is a 48 y.o. male presenting with chest pain. The history is provided by the patient. No language interpreter was used.  Chest Pain Associated symptoms: cough, fatigue, nausea, numbness, vomiting and weakness   Associated symptoms: no abdominal pain and no fever    This chart was scribed for nurse practitioner working with Nat Christen, MD, by Thea Alken, ED Scribe. This patient was seen in room APA19/APA19 and the patient's care was started at 11:19 AM.  James Serrano is a 48 y.o. male who presents to the Emergency Department complaining of radiating left CP x yesterday morning with associated nausea emesis, weakness, mild wheeze, cough, SOB, dehydration and leg weakness. Pt reports symptoms began yesterday morning upon waking but assumed it to be gas. He locates the pain to the sternal area that radiates to left shoulder.  He describes pain as pressure and rates pain 5-6/10. Pt reports pain was worse when he was working out side and hot. Pt has a poor fast food diet and ate a hamburger and fries yesterday. Pt has numbness in left hand but believes its from working although he is right hand dominant. Pt has family h/o heart problems. Pt father passed away from MI at 45. Pt reports brother passed away for bone caner 2 years ago.  Pt denies fever. Pt denies abdominal pain, bladder and bowel incontinence. Pt is a smoker and smokes about pack a day.    Past Medical History  Diagnosis Date  . Bronchitis    History reviewed. No pertinent past surgical history. No family history on file. History  Substance Use Topics  . Smoking status: Current Every Day Smoker -- 1.00 packs/day    Types: Cigarettes  . Smokeless tobacco: Not on file  . Alcohol Use: Yes     Comment: occasionally    Review of Systems   Constitutional: Positive for fatigue. Negative for fever and chills.  HENT: Negative.   Eyes: Negative for visual disturbance.  Respiratory: Positive for cough and wheezing.   Cardiovascular: Positive for chest pain. Negative for leg swelling.  Gastrointestinal: Positive for nausea, vomiting and diarrhea. Negative for abdominal pain.  Genitourinary: Negative for dysuria, urgency, frequency, decreased urine volume and difficulty urinating.  Musculoskeletal: Negative for gait problem and myalgias.  Skin: Negative for rash.  Allergic/Immunologic: Negative for immunocompromised state.  Neurological: Positive for weakness, light-headedness and numbness. Negative for syncope.  Psychiatric/Behavioral: Negative for confusion. The patient is not nervous/anxious.    Allergies  Review of patient's allergies indicates no known allergies.  Home Medications   Prior to Admission medications   Medication Sig Start Date End Date Taking? Authorizing Sonni Barse  albuterol (VENTOLIN HFA) 108 (90 BASE) MCG/ACT inhaler INHALE TWO PUFFS INTO LUNGS EVERY 4 HOURS AS NEEDED FOR  WHEEZING 04/01/13   Kathyrn Drown, MD  azithromycin (ZITHROMAX Z-PAK) 250 MG tablet Take 2 tablets (500 mg) on  Day 1,  followed by 1 tablet (250 mg) once daily on Days 2 through 5. 10/10/13   Kathyrn Drown, MD  benzonatate (TESSALON) 200 MG capsule Take 1 capsule (200 mg total) by mouth 3 (three) times daily as needed for cough. 10/12/13   Kathyrn Drown, MD  buPROPion (WELLBUTRIN SR) 150 MG 12 hr tablet Take 1 tablet (  150 mg total) by mouth 2 (two) times daily. 04/01/13 04/01/14  Kathyrn Drown, MD   BP 140/97  Pulse 71  Temp(Src) 97.8 F (36.6 C) (Oral)  Resp 18  Ht 6' (1.829 m)  Wt 255 lb (115.667 kg)  BMI 34.58 kg/m2  SpO2 97% Physical Exam  Nursing note and vitals reviewed. Constitutional: He is oriented to person, place, and time. He appears well-developed and well-nourished. No distress.  HENT:  Head: Normocephalic and  atraumatic.  Mouth/Throat: Uvula is midline, oropharynx is clear and moist and mucous membranes are normal.  Eyes: Conjunctivae and EOM are normal. Pupils are equal, round, and reactive to light.  Neck: Neck supple.  Cardiovascular: Normal rate, regular rhythm, normal heart sounds and intact distal pulses.   Pulmonary/Chest: Effort normal. He has wheezes.  Unable to reproduce the chest pressure the patient is having. Occasional  rhonchi and wheezing appreciated.   Abdominal: Soft. Bowel sounds are normal. He exhibits no distension and no mass. There is no tenderness. There is no rebound and no guarding.  Musculoskeletal: Normal range of motion.  No lower extremity edema, radial and pedal pulses strong, adequate circulation, good touch sensation and good strength upper and lower extremities.   Neurological: He is alert and oriented to person, place, and time. He has normal strength. No cranial nerve deficit or sensory deficit. Gait normal.  Skin: Skin is warm and dry.  Psychiatric: He has a normal mood and affect. His behavior is normal.    ED Course  Procedures (including critical care time) Labs Review Labs Reviewed  BASIC METABOLIC PANEL - Abnormal; Notable for the following:    Glucose, Bld 106 (*)    All other components within normal limits  CBC WITH DIFFERENTIAL  TROPONIN I   Results for orders placed during the hospital encounter of 02/12/14  CBC WITH DIFFERENTIAL      Result Value Ref Range   WBC 5.4  4.0 - 10.5 K/uL   RBC 5.11  4.22 - 5.81 MIL/uL   Hemoglobin 16.9  13.0 - 17.0 g/dL   HCT 48.2  39.0 - 52.0 %   MCV 94.3  78.0 - 100.0 fL   MCH 33.1  26.0 - 34.0 pg   MCHC 35.1  30.0 - 36.0 g/dL   RDW 12.6  11.5 - 15.5 %   Platelets 166  150 - 400 K/uL   Neutrophils Relative % 61  43 - 77 %   Neutro Abs 3.2  1.7 - 7.7 K/uL   Lymphocytes Relative 26  12 - 46 %   Lymphs Abs 1.4  0.7 - 4.0 K/uL   Monocytes Relative 8  3 - 12 %   Monocytes Absolute 0.5  0.1 - 1.0 K/uL    Eosinophils Relative 5  0 - 5 %   Eosinophils Absolute 0.3  0.0 - 0.7 K/uL   Basophils Relative 0  0 - 1 %   Basophils Absolute 0.0  0.0 - 0.1 K/uL  BASIC METABOLIC PANEL      Result Value Ref Range   Sodium 139  137 - 147 mEq/L   Potassium 4.3  3.7 - 5.3 mEq/L   Chloride 99  96 - 112 mEq/L   CO2 27  19 - 32 mEq/L   Glucose, Bld 106 (*) 70 - 99 mg/dL   BUN 8  6 - 23 mg/dL   Creatinine, Ser 0.84  0.50 - 1.35 mg/dL   Calcium 9.6  8.4 - 10.5 mg/dL  GFR calc non Af Amer >90  >90 mL/min   GFR calc Af Amer >90  >90 mL/min   Imaging Review Dg Chest 2 View  02/12/2014   CLINICAL DATA:  Chest pain for 2 days, smoking history  EXAM: CHEST  2 VIEW  COMPARISON:  11/01/2011  FINDINGS: Hyperinflation consistent with COPD. Heart size upper normal and stable. Vascular pattern normal. No consolidation effusion or pneumothorax.  IMPRESSION: COPD with no acute findings   Electronically Signed   By: Skipper Cliche M.D.   On: 02/12/2014 11:47    EKG Interpretation   Date/Time:  Sunday February 12 2014 11:16:41 EDT Ventricular Rate:  71 PR Interval:  148 QRS Duration: 97 QT Interval:  381 QTC Calculation: 414 R Axis:   91 Text Interpretation:  Sinus rhythm Borderline right axis deviation  Confirmed by Parkerson  MD, BRIAN ([id number]) on 02/12/2014 12:17:11 PM      MDM  48 y.o. male with elevated BP, left side chest pressure, nausea and feeling weak x 2 days. Father with massive MI age 39. Dr. Lacinda Axon spoke with the Hospitalist and will admit patient for observation and Cardiology Consult.  I have reviewed this patient's vital signs, nurses notes, appropriate labs and imaging.  I have discussed findings and plan of care with the patient and he voices understanding and agrees to plan.   I personally performed the services described in this documentation, which was scribed in my presence. The recorded information has been reviewed and is accurate.      [facility] Bunnie Pion, [place] 02/12/14 1310

## 2014-02-12 NOTE — ED Notes (Signed)
Pt c/o intermittent left side cp with weakness and nausea x 2 days.

## 2014-02-12 NOTE — ED Provider Notes (Signed)
Medical screening examination/treatment/procedure(s) were conducted as a shared visit with non-physician practitioner(s) and myself.  I personally evaluated the patient during the encounter.   EKG Interpretation   Date/Time:  Sunday February 12 2014 11:16:41 EDT Ventricular Rate:  71 PR Interval:  148 QRS Duration: 97 QT Interval:  381 QTC Calculation: 414 R Axis:   91 Text Interpretation:  Sinus rhythm Borderline right axis deviation  Confirmed by [place]  MD, BRIAN ([id number]) on 02/12/2014 12:17:11 PM     Chest pain with risk factors. EKG and troponin negative. Admit to obs  Nat Christen, MD 02/12/14 1513

## 2014-02-13 ENCOUNTER — Observation Stay (HOSPITAL_COMMUNITY): Payer: BC Managed Care – PPO

## 2014-02-13 ENCOUNTER — Encounter (HOSPITAL_COMMUNITY): Payer: Self-pay | Admitting: Adult Health

## 2014-02-13 ENCOUNTER — Encounter (HOSPITAL_COMMUNITY): Payer: Self-pay

## 2014-02-13 DIAGNOSIS — K219 Gastro-esophageal reflux disease without esophagitis: Secondary | ICD-10-CM

## 2014-02-13 DIAGNOSIS — R079 Chest pain, unspecified: Secondary | ICD-10-CM

## 2014-02-13 DIAGNOSIS — F1010 Alcohol abuse, uncomplicated: Secondary | ICD-10-CM

## 2014-02-13 LAB — LIPID PANEL
Cholesterol: 160 mg/dL (ref 0–200)
HDL: 42 mg/dL (ref >39)
LDL Cholesterol: 81 mg/dL (ref 0–99)
Total CHOL/HDL Ratio: 3.8 RATIO
Triglycerides: 185 mg/dL — ABNORMAL HIGH (ref <150)
VLDL: 37 mg/dL (ref 0–40)

## 2014-02-13 LAB — TROPONIN I: Troponin I: <0.3 ng/mL (ref <0.30)

## 2014-02-13 MED ORDER — SODIUM CHLORIDE 0.9 % IJ SOLN
INTRAMUSCULAR | Status: AC
  Filled 2014-02-13: qty 10

## 2014-02-13 MED ORDER — SIMETHICONE 80 MG PO CHEW: 160.0000 mg | CHEWABLE_TABLET | Freq: Four times a day (QID) | ORAL | Status: DC | PRN

## 2014-02-13 MED ORDER — TECHNETIUM TC 99M SESTAMIBI - CARDIOLITE
30.0000 | Freq: Once | INTRAVENOUS | Status: AC | PRN
  Administered 2014-02-13: 11:00:00 30 via INTRAVENOUS

## 2014-02-13 MED ORDER — REGADENOSON 0.4 MG/5ML IV SOLN
0.4000 mg | Freq: Once | INTRAVENOUS | Status: AC | PRN
  Administered 2014-02-13: 0.4 mg via INTRAVENOUS
  Filled 2014-02-13: qty 5

## 2014-02-13 MED ORDER — TECHNETIUM TC 99M SESTAMIBI GENERIC - CARDIOLITE
10.0000 | Freq: Once | INTRAVENOUS | Status: AC | PRN
  Administered 2014-02-13: 10 via INTRAVENOUS

## 2014-02-13 MED ORDER — SODIUM CHLORIDE 0.9 % IJ SOLN
INTRAMUSCULAR | Status: AC
  Administered 2014-02-13: 10 mL via INTRAVENOUS
  Filled 2014-02-13: qty 10

## 2014-02-13 MED ORDER — REGADENOSON 0.4 MG/5ML IV SOLN
INTRAVENOUS | Status: AC
  Administered 2014-02-13: 0.4 mg via INTRAVENOUS
  Filled 2014-02-13: qty 5

## 2014-02-13 MED ORDER — FAMOTIDINE 20 MG PO TABS: 20.0000 mg | ORAL_TABLET | Freq: Two times a day (BID) | ORAL | Status: DC

## 2014-02-13 NOTE — Discharge Summary (Signed)
I have directly reviewed the clinical findings, lab, imaging studies and management of this patient in detail. I have interviewed and examined the patient and agree with the documentation,  as recorded by the Physician extender, Ms. Dyanne Carrel, NP.  This is a 48 year old male with history of COPD, nicotine abuse that presents emergency department with complaints of chest pain. Patient continued to have ongoing chest pain. His troponins were negative x3. Cardiology was consulted and patient was to have a stress test however was unable to complete the stress test and had a stress Myoview instead. Results of that were negative, with a normal EF. Patient is to follow up with cardiology in 2 months. It was also discussed with patient the need for nicotine and smoking cessation as well as decreasing his alcohol consumption. Information was discussed with the patient as well as his wife.  James Serrano M.D on 02/13/2014 at 4:06 PM  [facility] Office  [phone]

## 2014-02-13 NOTE — Progress Notes (Signed)
Patient given discharge instructions with no questions. Wife at bedside. Ambulated out of facility with patient advocate.

## 2014-02-13 NOTE — Consult Note (Signed)
The patient was seen and examined, and I agree with the assessment and plan as documented above, with modifications as noted below. 48 yr old male admitted with chest pain described as pressure, lasting seconds, and radiating into shoulder, who has since ruled out for an ACS with serial troponins and a normal ECG. He has a 15-20 pk yr history of smoking, and also drinks one 6-pack of beer daily, along with several carbonated drinks. He has a family h/o premature CAD. He admits to a significant amount of gas. Symptoms appear to occur at rest with no exertional component. He has chronic shortness of breath likely related to COPD. He did experience relief with antacids. I agree with proceeding with a nuclear stress test for further clarification. Tobacco and alcohol cessation counseling were provided.

## 2014-02-13 NOTE — Progress Notes (Signed)
Utilization review completed.  

## 2014-02-13 NOTE — Progress Notes (Signed)
Stress Lab Nurses Notes - [place] 02/13/2014 Reason for doing test: Chest Pain Type of test: Test Changed unable to reach THR due to SOB. Carlton Adam given Nurse performing test: Gerrit Halls, RN Nuclear Medicine Tech: Melburn Hake Echo Tech: Not Applicable MD performing test: Koneswaran/K.Lawrence NP Family MD: Sallee Lange Test explained and consent signed: yes IV started: No redness or edema and Saline lock from floor Symptoms: SOB & fatigue in legs Treatment/Intervention: None Reason test stopped: protocol completed After recovery IV was: No redness or edema and Saline Lock flushed Patient to return to [place]. Med at : 11:40 Patient discharged: Transported back to room 301 via wc Patient's Condition upon discharge was: stable Comments: During test peak BP 173/86 & HR 131.  Recovery BP 130/87 & HR 91.  Symptoms resolved in recovery. Geanie Cooley T

## 2014-02-13 NOTE — Discharge Summary (Signed)
Physician Discharge Summary  James Serrano AVW:[id number] DOB: Apr 27, 1966 DOA: 02/12/2014  PCP: Sallee Lange, MD  Admit date: 02/12/2014 Discharge date: 02/13/2014  Time spent: 40 minutes  Recommendations for Outpatient Follow-up:  1. Follow up with PCP 1 week for evaluation of symptoms 2. Has appointment with cardiology 04/24/14.   Discharge Diagnoses:  Active Problems:   Chest pain   Discharge Condition: stable  Diet recommendation: heart healthy  Filed Weights   02/12/14 1114 02/12/14 1528  Weight: 115.667 kg (255 lb) 115.667 kg (255 lb)    History of present illness:  James Serrano is a 48 y.o. male with a history of COPD, nicotine abuse that presented to the emergency department on 02/12/14 with complaints of chest pain. Patient stated chest pain started the evening evening prior and had been intermittent. Patient stated that the pain was left-sided and penetrated straight to shoulder blade. The pain lasted a few seconds. It was associated with some nausea. Patient denied any diaphoresis, vomiting, dizziness. Patient had some shortness of breath but stated that this was constant for him. Patient stated that the pain  pressure-like in nature and rated as a 5/10 at its worst. Patient had not tried taking any aspirin for his pain. The pain usually subsided on its own. The pain did awaken him from sleep the evening pior. Patient thought that his pain was related to gas and indigestion. Patient stated his father passed away at age 71 from a heart attack. Patient admitted to smoking approximately one pack of cigarettes per day. Patient denied any recent ill contacts or travel.   Hospital Course:  Chest pain rule out acute coronary syndrome  -Cardiac troponins negative x3. No events on tele. EKG without evidence of ACS. Lipid panel with triglyceride 185 otherwise within the limits of normal. Patient is obese, smoker, has family history of coronary artery disease. No further chest pain.  Evaluated by cardiology and underwent stress myoview that was normal. Patient on 81mg  aspirin and statin. TSH, magnesium, phosphate within the limits of normal.  Nicotine abuse  -Patient counseled on smoking cessation  COPD  -stable at baseline. Chest x-ray shows COPD with no acute findings  Alcohol abuse  -Patient drinks 1 sixpack per night. Patient was counseled. No sign of withdrawal during this hospitalization  Procedures:  Nuclear stress test 02/13/14  Consultations:  cardiology  Discharge Exam: Filed Vitals:   02/13/14 1457  BP: 123/74  Pulse: 71  Temp: 98.2 F (36.8 C)  Resp: 20    General: well nourished NAD Cardiovascular: RRR No MGR No LE edema Respiratory: normal effort. BS coarse with expiratory wheeze  Discharge Instructions You were cared for by a hospitalist during your hospital stay. If you have any questions about your discharge medications or the care you received while you were in the hospital after you are discharged, you can call the unit and asked to speak with the hospitalist on call if the hospitalist that took care of you is not available. Once you are discharged, your primary care physician will handle any further medical issues. Please note that NO REFILLS for any discharge medications will be authorized once you are discharged, as it is imperative that you return to your primary care physician (or establish a relationship with a primary care physician if you do not have one) for your aftercare needs so that they can reassess your need for medications and monitor your lab values.      Discharge Instructions   Diet - low sodium  heart healthy    Complete by:  As directed      Discharge instructions    Complete by:  As directed   Follow up with PCP in 1 week for evaluation of symptoms     Increase activity slowly    Complete by:  As directed             Medication List         albuterol 108 (90 BASE) MCG/ACT inhaler  Commonly known as:   PROVENTIL HFA;VENTOLIN HFA  Inhale 2 puffs into the lungs every 6 (six) hours as needed for wheezing or shortness of breath.     alum & mag hydroxide-simeth 200-200-20 MG/5ML suspension  Commonly known as:  MAALOX/MYLANTA  Take 15 mLs by mouth every 6 (six) hours as needed for indigestion or heartburn.     aspirin-sod bicarb-citric acid 325 MG Tbef tablet  Commonly known as:  ALKA-SELTZER  Take 325 mg by mouth every 6 (six) hours as needed (Cold Symptoms).     DAYQUIL PO  Take 1 capsule by mouth every 4 (four) hours as needed (Cold Symptoms).     famotidine 20 MG tablet  Commonly known as:  PEPCID  Take 1 tablet (20 mg total) by mouth 2 (two) times daily.     NYQUIL PO  Take 1 capsule by mouth at bedtime as needed (Cold Symptoms).     simethicone 80 MG chewable tablet  Commonly known as:  MYLICON  Chew 2 tablets (160 mg total) by mouth 4 (four) times daily as needed for flatulence.       No Known Allergies Follow-up Information   Follow up with LUKING,SCOTT, MD. Schedule an appointment as soon as possible for a visit in 1 week. (evlauation of symptoms)    Specialty:  Family Medicine   Contact information:   [place] [address] [phone]       Follow up with Herminio Commons, MD On 04/24/2014. (at 2pm.)    Specialty:  Cardiology   Contact information:   [place] [address] [phone]        The results of significant diagnostics from this hospitalization (including imaging, microbiology, ancillary and laboratory) are listed below for reference.    Significant Diagnostic Studies: Dg Chest 2 View  02/12/2014   CLINICAL DATA:  Chest pain for 2 days, smoking history  EXAM: CHEST  2 VIEW  COMPARISON:  11/01/2011  FINDINGS: Hyperinflation consistent with COPD. Heart size upper normal and stable. Vascular pattern normal. No consolidation effusion or pneumothorax.  IMPRESSION: COPD with no acute findings   Electronically  Signed   By: Skipper Cliche M.D.   On: 02/12/2014 11:47    Microbiology: No results found for this or any previous visit (from the past 240 hour(s)).   Labs: Basic Metabolic Panel:  Recent Labs Lab 02/12/14 1131 02/12/14 1527  NA 139  --   K 4.3  --   CL 99  --   CO2 27  --   GLUCOSE 106*  --   BUN 8  --   CREATININE 0.84  --   CALCIUM 9.6  --   MG  --  2.3  PHOS  --  3.6   Liver Function Tests: No results found for this basename: AST, ALT, ALKPHOS, BILITOT, PROT, ALBUMIN,  in the last 168 hours No results found for this basename: LIPASE, AMYLASE,  in the last 168 hours No results found for  this basename: AMMONIA,  in the last 168 hours CBC:  Recent Labs Lab 02/12/14 1131 02/12/14 1527  WBC 5.4 5.9  NEUTROABS 3.2  --   HGB 16.9 16.9  HCT 48.2 48.1  MCV 94.3 95.4  PLT 166 167   Cardiac Enzymes:  Recent Labs Lab 02/12/14 1131 02/12/14 1527 02/12/14 2126 02/13/14 0252  TROPONINI <0.30 <0.30 <0.30 <0.30   BNP: BNP (last 3 results) No results found for this basename: PROBNP,  in the last 8760 hours CBG: No results found for this basename: GLUCAP,  in the last 168 hours     Signed:  [facility] 02/13/2014, 3:55 PM

## 2014-02-13 NOTE — Consult Note (Signed)
CARDIOLOGY CONSULT NOTE   Patient ID: GERRAD WELKER MRN: [id number] DOB/AGE: Jan 05, 1966 48 y.o.  Admit Date: 02/12/2014 Referring Physician: PTH Primary Physician: Sallee Lange, MD Consulting Cardiologist: Kate Sable MD Primary Cardiologist: New Reason for Consultation:Chest Pain  Clinical Summary Mr. Aversa is a 48 y.o.male with no prior cardiac history, a multiple cardiovascular risk factors to include family history, tobacco abuse, obesity, , who presented to the emergency room after experiencing chest pressure which she believed to be gas. States it awoke him, felt pressure in his chest radiating into his back, without associated dyspnea diaphoresis or weakness. He had some low-grade nausea. Also "felt my stomach rumbling", and therefore did not seek medical treatment assuming he was related to his GI symptoms. However, the pain became more severe, unrelenting, causing concern to family members who insisted he be seen in the emergency room.  In the emergency room blood pressure was found to be 140/97 heart rate 71 respirations 18, afebrile sat 97%. Cardiac enzymes are found be normal times 3, EKG normal sinus rhythm, without evidence of ACS. Chest x-ray revealed COPD with no acute findings. He history with a GI cocktail, and Pepcid IV. He did have resolution of symptoms. Unfortunately he has had recurrence since admission.  He admits to drinking a lot of beer Pepsi's and Mountain Dew's over the last few weeks. He does drink a sixpack every 2 days. It has been drinking beer for a long period of time. He works as an Producer, television/film/video, going up and down ladders, and walking carrying equipment, and has had no discomfort in his chest during these activities.  Other history includes chronic bronchitis, for which he uses his inhaler.   No Known Allergies  Medications Scheduled Medications: . aspirin EC  81 mg Oral Daily  . buPROPion  150 mg Oral BID  . enoxaparin  (LOVENOX) injection  40 mg Subcutaneous Q24H  . famotidine  20 mg Oral BID        PRN Medications: albuterol, simethicone   Past Medical History  Diagnosis Date  . Bronchitis     Past Surgical History  Procedure Laterality Date  . Tonsillectomy    . Orif acetabular fracture      Family History  Problem Relation Age of Onset  . Heart attack Father   . Cancer Father   . Cancer Brother     Social History Mr. Gatley reports that he has been smoking Cigarettes.  He has a 30 pack-year smoking history. He does not have any smokeless tobacco history on file. Mr. Waldman reports that he drinks alcohol.  Review of Systems Otherwise reviewed and negative except as outlined.  Physical Examination Blood pressure 112/69, pulse 62, temperature 97.5 F (36.4 C), temperature source Oral, resp. rate 20, height 6' (1.829 m), weight 255 lb (115.667 kg), SpO2 96.00%.  Intake/Output Summary (Last 24 hours) at 02/13/14 0852 Last data filed at 02/12/14 2300  Gross per 24 hour  Intake    360 ml  Output      0 ml  Net    360 ml    Telemetry: Normal sinus rhythm.  GEN: Resting comfortably without complaints HEENT: Conjunctiva and lids normal, oropharynx clear with moist mucosa. Neck: Supple, no elevated JVP or carotid bruits, no thyromegaly. Lungs: Expiratory and expiratory wheezes with crackles. Diminished bibasilar Cardiac: Regular rate and rhythm, no S3 or significant systolic murmur, no pericardial rub. Abdomen: Soft, obese, negative Murphy sign, nontender, no hepatomegaly, bowel sounds present, no  guarding or rebound. Extremities: No pitting edema, distal pulses 2+. Skin: Warm and dry. Musculoskeletal: No kyphosis. Neuropsychiatric: Alert and oriented x3, affect grossly appropriate.  Prior Cardiac Testing/Procedures 1. None Lab Results  Basic Metabolic Panel:  Recent Labs Lab 02/12/14 1131 02/12/14 1527  NA 139  --   K 4.3  --   CL 99  --   CO2 27  --   GLUCOSE 106*   --   BUN 8  --   CREATININE 0.84  --   CALCIUM 9.6  --   MG  --  2.3  PHOS  --  3.6    Liver Function Tests CBC:  Recent Labs Lab 02/12/14 1131 02/12/14 1527  WBC 5.4 5.9  NEUTROABS 3.2  --   HGB 16.9 16.9  HCT 48.2 48.1  MCV 94.3 95.4  PLT 166 167    Cardiac Enzymes:  Recent Labs Lab 02/12/14 1131 02/12/14 1527 02/12/14 2126 02/13/14 0252  TROPONINI <0.30 <0.30 <0.30 <0.30    Radiology: Dg Chest 2 View  02/12/2014   CLINICAL DATA:  Chest pain for 2 days, smoking history  EXAM: CHEST  2 VIEW  COMPARISON:  11/01/2011  FINDINGS: Hyperinflation consistent with COPD. Heart size upper normal and stable. Vascular pattern normal. No consolidation effusion or pneumothorax.  IMPRESSION: COPD with no acute findings   Electronically Signed   By: Skipper Cliche M.D.   On: 02/12/2014 11:47     ECG: Normal sinus rhythm   Impression and Recommendations  1.Chest Pain: Typical and atypical features, occurring at rest, with pressure coming and going, radiating to his back. With multiple cardiovascular risk factors to include smoking, family history, obesity. EKG is normal ruling out ACS, cardiac enzymes were also found to be negative x3. He unfortunately, has had recurrence of chest discomfort since admission, but none this morning. He has been kept n.p.o. for a stress test. This is not ordered this morning. If negative can return home. Consider outpatient GI evaluation for frequent gas pain. Murphy sign was negative. He does drink a lot of carbonated tablet she is to include [employer] and beer.   2. Tobacco Abuse: 30-pack-year smoker, no plans to quit currently. Cessation has been recommended  3. COPD: Noted on chest x-ray on evaluation in ER. Patient has chronic bronchitis.   Signed: Phill Myron. Lawrence NP  02/13/2014, 8:52 AM Co-Sign MD

## 2014-02-17 ENCOUNTER — Encounter: Payer: Self-pay | Admitting: Family Medicine

## 2014-02-17 ENCOUNTER — Ambulatory Visit (INDEPENDENT_AMBULATORY_CARE_PROVIDER_SITE_OTHER): Payer: BC Managed Care – PPO | Admitting: Family Medicine

## 2014-02-17 VITALS — BP 134/84 | Ht 72.0 in | Wt 258.0 lb

## 2014-02-17 DIAGNOSIS — J449 Chronic obstructive pulmonary disease, unspecified: Secondary | ICD-10-CM

## 2014-02-17 MED ORDER — ALBUTEROL SULFATE HFA 108 (90 BASE) MCG/ACT IN AERS: 2.0000 | INHALATION_SPRAY | Freq: Four times a day (QID) | RESPIRATORY_TRACT | Status: DC | PRN

## 2014-02-17 MED ORDER — PANTOPRAZOLE SODIUM 40 MG PO TBEC
40.0000 mg | DELAYED_RELEASE_TABLET | Freq: Every day | ORAL | Status: DC
Start: 2014-02-17 — End: 2019-05-02

## 2014-02-17 NOTE — Progress Notes (Signed)
   Subjective:    Patient ID: James Serrano, male    DOB: June 21, 1966, 48 y.o.   MRN: [id number]  HPIFollow up hospital stay for chest pain.   Having fatigue.   Rerquesting rx for gas. Patient experienced a deep sensation of pressure in the chest. Before being admitted to the hospital. ER record in the hospital records reviewed reviewed in full and presents the patient. Next  Cardiac enzymes were negative. Negative stress test Myoview. Try managing negative. Patient given anti-reflux meds in the hospital with significant improvement.  Needs refill on ventolin inhaler.   Also had gurgling gas sensation, accompnied by bloating and burping, increased press sens  One ppd has cut down smoking a bit lately. States the Wellbutrin made him feel jittery and bad. Review of Systems No nausea no diaphoresis no shortness of breath no significant abdominal pain some belching and burping persists    Objective:   Physical Exam  Alert no apparent distress vitals stable. HEENT normal. Lungs clear. Heart regular in rhythm. Epigastrium no significant tenderness currently      Assessment & Plan:  Impression 1 chest pain MI ruled out discussed #2 probable reflux will along with spasm likely etiology of #1 #3 COPD discussed important quit smoking. Did not and the Wellbutrin well. Not thrilled about trying Chantix. Plan trial protonic 40 daily. Encouraged to stop smoking. Cut down caffeine intake. Albuterol refilled per patient request. Exercise encourage. WSL

## 2014-04-24 ENCOUNTER — Encounter: Payer: 59 | Admitting: Cardiovascular Disease

## 2014-04-26 ENCOUNTER — Encounter: Payer: Self-pay | Admitting: Cardiovascular Disease

## 2014-05-19 ENCOUNTER — Ambulatory Visit: Payer: BC Managed Care – PPO | Admitting: Family Medicine

## 2014-06-09 ENCOUNTER — Encounter: Payer: 59 | Admitting: Cardiovascular Disease

## 2014-06-09 ENCOUNTER — Encounter: Payer: Self-pay | Admitting: Cardiovascular Disease

## 2014-07-01 ENCOUNTER — Other Ambulatory Visit: Payer: Self-pay | Admitting: Family Medicine

## 2014-07-05 ENCOUNTER — Telehealth: Payer: Self-pay | Admitting: Family Medicine

## 2014-07-05 NOTE — Telephone Encounter (Signed)
Patient's wife notified and verbalized understanding.  

## 2014-07-05 NOTE — Telephone Encounter (Signed)
Spoke with Larene Pickett and patient's wife. Refills are now available for patient's inhaler. Pt verbalized understanding.

## 2014-07-05 NOTE — Telephone Encounter (Signed)
Lots of parainfluenza out there, viral and does not respond to any abx tho zpk may help avoid complic, hycodan will help too, so yes decent choices

## 2014-07-05 NOTE — Telephone Encounter (Signed)
Wife states that [facility] states they have not received refill for VENTOLIN HFA 108 (90 BASE) MCG/ACT inhaler, please send in again and call pt when done

## 2014-07-05 NOTE — Telephone Encounter (Signed)
Pts spouse is calling to say that he was seen at a urgent care in [facility] yesterday  Was swabbed for the flu, came back negative. He was issued Zpak, Hycodan,   His symptoms are cough, weakness, dizzy, fever, head congestion, some chest congestion   Spouse is wanting to know if this medication is good an will it cover his condition.   explained to spouse that we may not be able to speak with her due to [place] but she states she will  Not wake him up that you should be able to tell her this info with no problems.  She stated that she  Has a POA that she can send in if necessary

## 2014-07-10 ENCOUNTER — Encounter: Payer: Self-pay | Admitting: Family Medicine

## 2014-07-10 ENCOUNTER — Ambulatory Visit (INDEPENDENT_AMBULATORY_CARE_PROVIDER_SITE_OTHER): Payer: BC Managed Care – PPO | Admitting: Family Medicine

## 2014-07-10 VITALS — BP 128/84 | Temp 98.8°F | Ht 72.0 in | Wt 256.0 lb

## 2014-07-10 DIAGNOSIS — J208 Acute bronchitis due to other specified organisms: Secondary | ICD-10-CM

## 2014-07-10 DIAGNOSIS — R0600 Dyspnea, unspecified: Secondary | ICD-10-CM

## 2014-07-10 MED ORDER — PREDNISONE 20 MG PO TABS: ORAL_TABLET | ORAL | Status: DC

## 2014-07-10 MED ORDER — ALBUTEROL SULFATE HFA 108 (90 BASE) MCG/ACT IN AERS: INHALATION_SPRAY | RESPIRATORY_TRACT | Status: DC

## 2014-07-10 MED ORDER — LEVOFLOXACIN 500 MG PO TABS: 500.0000 mg | ORAL_TABLET | Freq: Every day | ORAL | Status: DC

## 2014-07-10 NOTE — Progress Notes (Signed)
   Subjective:    Patient ID: James Serrano, male    DOB: 17-Nov-1965, 48 y.o.   MRN: [id number]  Cough This is a new problem. Episode onset: Last Tuesday. The problem has been waxing and waning. The cough is productive of sputum. Associated symptoms include a fever, myalgias, nasal congestion and wheezing. Treatments tried: Z-Pak from Urgent Care. The treatment provided mild relief. His past medical history is significant for bronchitis and COPD.   PMH benign  Review of Systems  Constitutional: Positive for fever.  Respiratory: Positive for cough and wheezing.   Musculoskeletal: Positive for myalgias.       Objective:   Physical Exam  Lungs bilateral expiratory wheezes heart regular pulse normal extremities no edema skin warm dry      Assessment & Plan:  I encouraged this patient quit smoking Prednisone taper Antibiotics prescribed X-rays labs not indicated If worse go to ER Patient encouraged to get pneumonia vaccine and flu vaccine cannot do so on today's visit because of prednisone Patient was also encouraged get pulmonary function tests.

## 2014-07-21 ENCOUNTER — Ambulatory Visit (HOSPITAL_COMMUNITY)
Admission: RE | Admit: 2014-07-21 | Discharge: 2014-07-21 | Disposition: A | Discharge status: Home / Self Care | Payer: 59 | Source: Ambulatory Visit | Attending: Family Medicine | Admitting: Family Medicine

## 2014-07-21 ENCOUNTER — Other Ambulatory Visit: Payer: Self-pay | Admitting: *Deleted

## 2014-07-21 ENCOUNTER — Telehealth: Payer: Self-pay | Admitting: *Deleted

## 2014-07-21 DIAGNOSIS — J441 Chronic obstructive pulmonary disease with (acute) exacerbation: Secondary | ICD-10-CM | POA: Diagnosis present

## 2014-07-21 DIAGNOSIS — R0602 Shortness of breath: Secondary | ICD-10-CM

## 2014-07-21 DIAGNOSIS — Z87891 Personal history of nicotine dependence: Secondary | ICD-10-CM | POA: Diagnosis not present

## 2014-07-21 MED ORDER — AZITHROMYCIN 250 MG PO TABS: ORAL_TABLET | ORAL | Status: DC

## 2014-07-21 MED ORDER — PREDNISONE 20 MG PO TABS: ORAL_TABLET | ORAL | Status: DC

## 2014-07-21 NOTE — Telephone Encounter (Signed)
Pt seen 11/16 prescribed levaquin and prednisone taper. Wife states he finished levaquin but stopped the prednisone because it was making his stomach swell. Swelling has gone down since stopping med. stilll having shortness of breath. No worse but no better than when seen, nasal and chest congestion. No fever. Using albuterol. Consult with DR. Scott. Option #1 appt with Dr. Richardson Landry this afternoon or cxr, zpack, pred 20mg  2 qd for 4 days, then 1 qd for 4days. Wife did not want to bring him in. Discussed with her to that he needs to take prednisone a nd Dr. Nicki Reaper sent in lower dose. Will go over and do CXR and continue albuterol. meds sent to [facility] [place]. Recheck next week per Dr. Nicki Reaper. Transferred to front to schedule visit. And advised to go to ED if worse over weekend.

## 2014-07-21 NOTE — Telephone Encounter (Signed)
Pt's wife called stating the patient is still having issues, since pt has seen Dr Wolfgang Phoenix pt is still having congestion, pt does not feel well, pt said he feels weak. Per wife pt is going back out of town tomorrow. Pt's wife would like to speak with the nurse, per wife pt's mother n law has a breathing machine and pt has used that to help him. Please advise [phone]

## 2014-07-28 ENCOUNTER — Ambulatory Visit: Payer: BC Managed Care – PPO | Admitting: Family Medicine

## 2015-03-30 ENCOUNTER — Ambulatory Visit: Payer: Self-pay | Admitting: Family Medicine

## 2015-04-20 ENCOUNTER — Ambulatory Visit: Payer: Self-pay | Admitting: Family Medicine

## 2015-05-18 ENCOUNTER — Ambulatory Visit: Payer: Self-pay | Admitting: Family Medicine

## 2015-06-15 ENCOUNTER — Ambulatory Visit: Payer: Self-pay | Admitting: Family Medicine

## 2015-11-01 ENCOUNTER — Other Ambulatory Visit: Payer: Self-pay | Admitting: Family Medicine

## 2016-05-02 ENCOUNTER — Emergency Department (HOSPITAL_COMMUNITY)
Admission: EM | Admit: 2016-05-02 | Discharge: 2016-05-02 | Disposition: A | Discharge status: Home / Self Care | Payer: BLUE CROSS/BLUE SHIELD | Attending: Emergency Medicine | Admitting: Emergency Medicine

## 2016-05-02 ENCOUNTER — Encounter (HOSPITAL_COMMUNITY): Payer: Self-pay | Admitting: Emergency Medicine

## 2016-05-02 ENCOUNTER — Emergency Department (HOSPITAL_COMMUNITY): Payer: BLUE CROSS/BLUE SHIELD

## 2016-05-02 DIAGNOSIS — F17210 Nicotine dependence, cigarettes, uncomplicated: Secondary | ICD-10-CM | POA: Insufficient documentation

## 2016-05-02 DIAGNOSIS — Z792 Long term (current) use of antibiotics: Secondary | ICD-10-CM | POA: Insufficient documentation

## 2016-05-02 DIAGNOSIS — X500XXA Overexertion from strenuous movement or load, initial encounter: Secondary | ICD-10-CM | POA: Insufficient documentation

## 2016-05-02 DIAGNOSIS — Y939 Activity, unspecified: Secondary | ICD-10-CM | POA: Insufficient documentation

## 2016-05-02 DIAGNOSIS — S46912A Strain of unspecified muscle, fascia and tendon at shoulder and upper arm level, left arm, initial encounter: Secondary | ICD-10-CM

## 2016-05-02 DIAGNOSIS — Z79899 Other long term (current) drug therapy: Secondary | ICD-10-CM | POA: Diagnosis not present

## 2016-05-02 DIAGNOSIS — Y999 Unspecified external cause status: Secondary | ICD-10-CM | POA: Diagnosis not present

## 2016-05-02 DIAGNOSIS — S161XXA Strain of muscle, fascia and tendon at neck level, initial encounter: Secondary | ICD-10-CM

## 2016-05-02 DIAGNOSIS — J449 Chronic obstructive pulmonary disease, unspecified: Secondary | ICD-10-CM | POA: Insufficient documentation

## 2016-05-02 DIAGNOSIS — Y929 Unspecified place or not applicable: Secondary | ICD-10-CM | POA: Diagnosis not present

## 2016-05-02 DIAGNOSIS — S4992XA Unspecified injury of left shoulder and upper arm, initial encounter: Secondary | ICD-10-CM | POA: Diagnosis present

## 2016-05-02 MED ORDER — DICLOFENAC SODIUM 75 MG PO TBEC: 75.0000 mg | DELAYED_RELEASE_TABLET | Freq: Two times a day (BID) | ORAL | 0 refills | Status: DC

## 2016-05-02 MED ORDER — CYCLOBENZAPRINE HCL 10 MG PO TABS: 10.0000 mg | ORAL_TABLET | Freq: Three times a day (TID) | ORAL | 0 refills | Status: DC | PRN

## 2016-05-02 MED ORDER — HYDROCODONE-ACETAMINOPHEN 7.5-325 MG PO TABS: 1.0000 | ORAL_TABLET | Freq: Four times a day (QID) | ORAL | 0 refills | Status: DC | PRN

## 2016-05-02 NOTE — ED Triage Notes (Signed)
Patient c/o left shoulder pain that started yesterday while lifting heavy pipes. Per patient used icy hot this morning with no relief. Per patient pain radiates down left arm. Patient unable to lift arm above head due to pain. No obvious deformity noted.

## 2016-05-02 NOTE — Discharge Instructions (Signed)
Alternate ice and heat to your neck and shoulder.  Follow-up with your primary doctor or with the orthopedic doctor listed.

## 2016-05-02 NOTE — ED Provider Notes (Signed)
[name] Provider Note   CSN: [id number] Arrival date & time: 05/02/16  0831     History   Chief Complaint Chief Complaint  Patient presents with  . Shoulder Pain    HPI James Serrano is a 50 y.o. male.  HPI  James Serrano is a 50 y.o. male who presents to the Emergency Department complaining of left shoulder and neck pain for one day.  Pain began after heavy lifting at his job.  He also reports reaching and stretching movements.  Pain is worse with rotation of his neck and left arm movement.  Pin improves at rest.  He has applied topical muscle cream without relief.  He denies numbness or weakness of the extremity, chest pain, shortness of breath, fever, dizziness or headaches.    Past Medical History:  Diagnosis Date  . Bronchitis   . Tobacco use     Patient Active Problem List   Diagnosis Date Noted  . Chronic obstructive pulmonary disease ([place]) 02/17/2014  . Chest pain 02/12/2014    Past Surgical History:  Procedure Laterality Date  . ORIF ACETABULAR FRACTURE    . TONSILLECTOMY         Home Medications    Prior to Admission medications   Medication Sig Start Date End Date Taking? Authorizing Provider  azithromycin (ZITHROMAX Z-PAK) 250 MG tablet Take 2 tablets (500 mg) on  Day 1,  followed by 1 tablet (250 mg) once daily on Days 2 through 5. 07/21/14   Kathyrn Drown, MD  pantoprazole (PROTONIX) 40 MG tablet Take 1 tablet (40 mg total) by mouth daily. 02/17/14   Mikey Kirschner, MD  predniSONE (DELTASONE) 20 MG tablet Take 2 qd for 4 days, then 1 qd for 4 days 07/21/14   Kathyrn Drown, MD  VENTOLIN HFA 108 (90 Base) MCG/ACT inhaler INHALE 2 PUFFS INTO THE LUNGS EVERY 6 HOURS AS NEEDED FOR WHEEZING. 11/01/15   Kathyrn Drown, MD    Family History Family History  Problem Relation Age of Onset  . Heart attack Father   . Cancer Father   . Cancer Brother     Social History Social History  Substance Use Topics  . Smoking status: Current Every  Day Smoker    Packs/day: 1.00    Years: 30.00    Types: Cigarettes  . Smokeless tobacco: Never Used  . Alcohol use Yes     Comment: occasionally     Allergies   Review of patient's allergies indicates no known allergies.   Review of Systems Review of Systems  Constitutional: Negative for chills and fever.  Respiratory: Negative for chest tightness and shortness of breath.   Cardiovascular: Negative for chest pain.  Gastrointestinal: Negative for abdominal pain, nausea and vomiting.  Genitourinary: Negative for difficulty urinating and dysuria.  Musculoskeletal: Positive for arthralgias (left shoulder pain) and neck pain. Negative for joint swelling.  Skin: Negative for color change and wound.  Neurological: Negative for dizziness, weakness, numbness and headaches.  All other systems reviewed and are negative.    Physical Exam Updated Vital Signs BP 149/89 (BP Location: Right Arm)   Pulse 64   Temp 98.1 F (36.7 C) (Oral)   Resp 18   Ht 6' (1.829 m)   Wt 115.7 kg   SpO2 98%   BMI 34.58 kg/m   Physical Exam  Constitutional: He is oriented to person, place, and time. He appears well-developed and well-nourished. No distress.  HENT:  Head: Normocephalic.  Mouth/Throat: Oropharynx is clear and moist.  Neck: Trachea normal and normal range of motion. Muscular tenderness present. No spinous process tenderness present. No neck rigidity. Normal range of motion present. No Kernig's sign noted.    ttp along the left cervical paraspinal muscles and trapezius muscles.   Cardiovascular: Normal rate, regular rhythm and intact distal pulses.   No murmur heard. Pulmonary/Chest: Effort normal and breath sounds normal. No respiratory distress.  Musculoskeletal: Normal range of motion. He exhibits tenderness.       Left shoulder: He exhibits tenderness. He exhibits no bony tenderness, no swelling, no crepitus, normal pulse and normal strength.       Arms: ttp of the anterior left  AC joint.  No erythema, excessive warmth or edema.  Pain reproduced with abduction.  Distal sensation intact.  Grip strength strong and symmetrical bilaterally  Neurological: He is alert and oriented to person, place, and time.  Skin: Skin is warm. No rash noted.  Psychiatric: He has a normal mood and affect.  Nursing note and vitals reviewed.    ED Treatments / Results  Labs (all labs ordered are listed, but only abnormal results are displayed) Labs Reviewed - No data to display  EKG  EKG Interpretation None       Radiology Dg Cervical Spine Complete  Result Date: 05/02/2016 CLINICAL DATA:  Lifting heavy pipe yesterday, started having left-sided neck pain into left shoulder and arm. EXAM: CERVICAL SPINE - COMPLETE 4+ VIEW COMPARISON:  None. FINDINGS: AP, lateral, odontoid, and bilateral oblique views of the cervical spine. There is straightening of the cervical spine. There is no acute fracture or subluxation. Facet disease is present at C2-C3. Prevertebral soft tissue thickness is normal. There is mild narrowing at C3-C4. There are anterior osteophytes between C3 and C7. Dens and lateral masses are within normal limits. Mild diffuse right foraminal narrowing. Mild left foraminal narrowing at C3-C4. IMPRESSION: 1. Straightening of the cervical spine. No radiographic evidence for acute osseous abnormality. 2. Mild degenerative disc disease. No radiographic evidence for acute osseous abnormality. Electronically Signed   By: Donavan Foil M.D.   On: 05/02/2016 09:56   Dg Shoulder Left  Result Date: 05/02/2016 CLINICAL DATA:  Lifting heavy pipe yesterday with onset of left-sided neck pain radiating into the shoulder and arm. EXAM: LEFT SHOULDER - 2+ VIEW COMPARISON:  Chest x-ray of July 21, 2014 which included a portion of the left shoulder. FINDINGS: The bones are subjectively adequately mineralized. The glenohumeral and AC joints exhibit no acute abnormalities. No significant osteophyte  formation is observed. The observed portions of the left clavicle and upper left ribs are normal. IMPRESSION: There is no acute or significant chronic bony abnormality of the left shoulder. Electronically Signed   By: David  Martinique M.D.   On: 05/02/2016 09:46    Procedures Procedures (including critical care time)  Medications Ordered in ED Medications - No data to display   Initial Impression / Assessment and Plan / ED Course  I have reviewed the triage vital signs and the nursing notes.  Pertinent labs & imaging results that were available during my care of the patient were reviewed by me and considered in my medical decision making (see chart for details).  Clinical Course    Pt with left shoulder and cervical paraspinal tenderness.  Pain reproduced with abduction of the shoulder.  No concerning sx's for ACS.   NV intact. Likely musculoskeletal.   Pt agrees to close PMD f/u and symptomatic tx with  NSAID, pain medication and muscle relaxer  Final Clinical Impressions(s) / ED Diagnoses   Final diagnoses:  Shoulder strain, left, initial encounter  Cervical strain, acute, initial encounter    New Prescriptions New Prescriptions   No medications on file     Kem Parkinson, Hershal Coria 05/04/16 0370    Pattricia Boss, MD 05/06/16 1601

## 2016-05-19 ENCOUNTER — Encounter: Payer: Self-pay | Admitting: Family Medicine

## 2016-05-19 ENCOUNTER — Ambulatory Visit (INDEPENDENT_AMBULATORY_CARE_PROVIDER_SITE_OTHER): Payer: BLUE CROSS/BLUE SHIELD | Admitting: Family Medicine

## 2016-05-19 VITALS — BP 128/74 | Temp 98.4°F | Wt 250.4 lb

## 2016-05-19 DIAGNOSIS — J209 Acute bronchitis, unspecified: Secondary | ICD-10-CM

## 2016-05-19 DIAGNOSIS — B9689 Other specified bacterial agents as the cause of diseases classified elsewhere: Secondary | ICD-10-CM

## 2016-05-19 DIAGNOSIS — J019 Acute sinusitis, unspecified: Secondary | ICD-10-CM | POA: Diagnosis not present

## 2016-05-19 MED ORDER — AZITHROMYCIN 250 MG PO TABS: ORAL_TABLET | ORAL | 0 refills | Status: DC

## 2016-05-19 MED ORDER — VARENICLINE TARTRATE 0.5 MG X 11 & 1 MG X 42 PO MISC
ORAL | 0 refills | Status: DC
Start: 2016-05-19 — End: 2018-08-06

## 2016-05-19 MED ORDER — VARENICLINE TARTRATE 1 MG PO TABS: 1.0000 mg | ORAL_TABLET | Freq: Two times a day (BID) | ORAL | 0 refills | Status: DC

## 2016-05-19 MED ORDER — PREDNISONE 20 MG PO TABS: ORAL_TABLET | ORAL | 0 refills | Status: DC

## 2016-05-19 MED ORDER — ALBUTEROL SULFATE HFA 108 (90 BASE) MCG/ACT IN AERS: INHALATION_SPRAY | RESPIRATORY_TRACT | 12 refills | Status: DC

## 2016-05-19 NOTE — Progress Notes (Addendum)
   Subjective:    Patient ID: James Serrano, male    DOB: Feb 15, 1966, 50 y.o.   MRN: [id number]  Sinusitis  This is a new problem. The current episode started in the past 7 days. Associated symptoms include congestion and coughing. Pertinent negatives include no ear pain. (Runny nose, wheezing )   Patient significant head congestion drainage coughing some wheezing no vomiting denies high fever chills patient does smoke he is interested in quitting   Review of Systems  Constitutional: Negative for activity change and fever.  HENT: Positive for congestion and rhinorrhea. Negative for ear pain.   Eyes: Negative for discharge.  Respiratory: Positive for cough. Negative for wheezing.   Cardiovascular: Negative for chest pain.       Objective:   Physical Exam  Constitutional: He appears well-developed.  HENT:  Head: Normocephalic.  Mouth/Throat: Oropharynx is clear and moist. No oropharyngeal exudate.  Neck: Normal range of motion.  Cardiovascular: Normal rate, regular rhythm and normal heart sounds.   No murmur heard. Pulmonary/Chest: Effort normal. No respiratory distress. He has wheezes.  Lymphadenopathy:    He has no cervical adenopathy.  Neurological: He exhibits normal muscle tone.  Skin: Skin is warm and dry.  Nursing note and vitals reviewed.  Faint wheezes are noted on respiratory exam no signs of pneumonia       Assessment & Plan:  Viral illness Secondary rhinosinusitis Acute bronchitis Reactive airway Albuterol when necessary Prednisone if necessary Antibiotics sent in Quitting smoking discussed in detail including risk and benefits. Patient chooses Chantix. If he has any depression symptoms or suicidal ideation he is to stop the medicine immediately and notify us  Patient has tried nicotine patches. He broke out with a rash with those patient tried Wellbutrin causes nausea in a funny sensation. Therefore Chantix would be the best option

## 2016-05-20 ENCOUNTER — Telehealth: Payer: Self-pay

## 2016-05-20 NOTE — Telephone Encounter (Signed)
Received PA approval from [place] for Chantix. Approval # [id number] 04/20/16 thru 08/18/2016. Pharmacy was notified.

## 2016-05-20 NOTE — Telephone Encounter (Signed)
Please review Prior Authorization process for Chantix approval.

## 2016-05-21 NOTE — Telephone Encounter (Signed)
May proceed with prior authorization-please inform [facility] dictation

## 2016-05-22 ENCOUNTER — Telehealth: Payer: Self-pay | Admitting: *Deleted

## 2016-05-22 NOTE — Telephone Encounter (Signed)
chantix starting month pak approved for coverage from 04/20/16 - 08/18/16. [facility] pharm notiifed.

## 2016-12-12 ENCOUNTER — Encounter: Payer: Self-pay | Admitting: Nurse Practitioner

## 2016-12-12 ENCOUNTER — Ambulatory Visit (INDEPENDENT_AMBULATORY_CARE_PROVIDER_SITE_OTHER): Payer: BLUE CROSS/BLUE SHIELD | Admitting: Nurse Practitioner

## 2016-12-12 VITALS — BP 138/86 | Temp 98.0°F | Ht 72.0 in | Wt 251.0 lb

## 2016-12-12 DIAGNOSIS — J069 Acute upper respiratory infection, unspecified: Secondary | ICD-10-CM | POA: Diagnosis not present

## 2016-12-12 DIAGNOSIS — B9689 Other specified bacterial agents as the cause of diseases classified elsewhere: Secondary | ICD-10-CM

## 2016-12-12 DIAGNOSIS — J441 Chronic obstructive pulmonary disease with (acute) exacerbation: Secondary | ICD-10-CM | POA: Diagnosis not present

## 2016-12-12 MED ORDER — AZITHROMYCIN 250 MG PO TABS: ORAL_TABLET | ORAL | 0 refills | Status: DC

## 2016-12-12 MED ORDER — PREDNISONE 20 MG PO TABS: ORAL_TABLET | ORAL | 0 refills | Status: DC

## 2016-12-12 NOTE — Progress Notes (Signed)
Subjective:  Presents complaints of cough and chest tightness that began on 12/09/2016. No fever sore throat headache or ear pain. Runny nose. Frequent cough. Last night began producing yellow/green sputum. Wheezing at times. Using his albuterol inhaler about every 5 hours which helps. Last used about an hour and a half ago. Has reduced his smoking from one and a half packs per day to a half pack per day.  Objective:   BP 138/86   Temp 98 F (36.7 C)   Ht 6' (1.829 m)   Wt 251 lb (113.9 kg)   BMI 34.04 kg/m  NAD. Alert, oriented. TMs retracted bilateral, no erythema. Pharynx injected with PND noted. Neck supple with mild soft anterior adenopathy. Lungs scattered faint expiratory wheezes, minimally diminished airflow. No tachypnea. Normal color. Heart regular rate rhythm. No indication for nebulizer treatment at this time.  Assessment:   Problem List Items Addressed This Visit      Respiratory   Chronic obstructive pulmonary disease ([place]) - Primary   Relevant Medications   azithromycin (ZITHROMAX Z-PAK) 250 MG tablet   predniSONE (DELTASONE) 20 MG tablet    Other Visit Diagnoses    Bacterial upper respiratory infection       Relevant Medications   azithromycin (ZITHROMAX Z-PAK) 250 MG tablet       Plan:   Meds ordered this encounter  Medications  . azithromycin (ZITHROMAX Z-PAK) 250 MG tablet    Sig: Take 2 tablets (500 mg) on  Day 1,  followed by 1 tablet (250 mg) once daily on Days 2 through 5.    Dispense:  6 each    Refill:  0    Order Specific Question:   Supervising Provider    Answer:   Mikey Kirschner [[id number]]  . predniSONE (DELTASONE) 20 MG tablet    Sig: 3 po qd x 3 d then 2 po qd x 3 d then 1 po qd x 2 d    Dispense:  17 tablet    Refill:  0    Order Specific Question:   Supervising Provider    Answer:   Mikey Kirschner [[id number]]   Continue OTC meds as directed. Discussed importance of smoking cessation. Call back in 72 hours if no improvement, call or go to ED  if worse.

## 2017-04-03 ENCOUNTER — Other Ambulatory Visit: Payer: Self-pay | Admitting: *Deleted

## 2017-04-03 ENCOUNTER — Telehealth: Payer: Self-pay | Admitting: Family Medicine

## 2017-04-03 MED ORDER — ALBUTEROL SULFATE HFA 108 (90 BASE) MCG/ACT IN AERS: INHALATION_SPRAY | RESPIRATORY_TRACT | 0 refills | Status: DC

## 2017-04-03 NOTE — Telephone Encounter (Signed)
Patient is having to use his inhaler a lot more than every 2-4 hours because of working in [employer].  Patient is needing a new Rx sent in to [facility] because the insurance says that it is too soon to fill with the Rx on file now.

## 2017-04-03 NOTE — Telephone Encounter (Signed)
Ok times one, also rec ov with dr scott needs to take a daily preventive agent with this much rscue inhaler use

## 2017-04-03 NOTE — Telephone Encounter (Signed)
Called pt told him inhaler and was sent and then got disconnect. Tried to call to let him know he needs office visit with dr scott no answer

## 2017-06-04 ENCOUNTER — Other Ambulatory Visit: Payer: Self-pay | Admitting: Family Medicine

## 2017-07-23 ENCOUNTER — Other Ambulatory Visit: Payer: Self-pay | Admitting: Family Medicine

## 2017-08-20 ENCOUNTER — Other Ambulatory Visit: Payer: Self-pay | Admitting: Family Medicine

## 2017-10-08 ENCOUNTER — Other Ambulatory Visit: Payer: Self-pay | Admitting: Family Medicine

## 2017-11-06 ENCOUNTER — Ambulatory Visit: Payer: BLUE CROSS/BLUE SHIELD | Admitting: Family Medicine

## 2017-11-07 DIAGNOSIS — J209 Acute bronchitis, unspecified: Secondary | ICD-10-CM | POA: Diagnosis not present

## 2017-11-14 ENCOUNTER — Other Ambulatory Visit: Payer: Self-pay | Admitting: Family Medicine

## 2017-11-27 ENCOUNTER — Ambulatory Visit: Payer: BLUE CROSS/BLUE SHIELD | Admitting: Family Medicine

## 2017-12-15 ENCOUNTER — Encounter: Payer: Self-pay | Admitting: Family Medicine

## 2017-12-18 ENCOUNTER — Ambulatory Visit: Payer: BLUE CROSS/BLUE SHIELD | Admitting: Family Medicine

## 2018-01-11 ENCOUNTER — Other Ambulatory Visit: Payer: Self-pay | Admitting: Family Medicine

## 2018-01-11 NOTE — Telephone Encounter (Signed)
May have 1 refill send notification he needs office visit

## 2018-03-13 ENCOUNTER — Other Ambulatory Visit: Payer: Self-pay | Admitting: Family Medicine

## 2018-07-27 DIAGNOSIS — J441 Chronic obstructive pulmonary disease with (acute) exacerbation: Secondary | ICD-10-CM | POA: Diagnosis not present

## 2018-08-06 ENCOUNTER — Ambulatory Visit: Payer: BLUE CROSS/BLUE SHIELD | Admitting: Family Medicine

## 2018-08-06 ENCOUNTER — Encounter: Payer: Self-pay | Admitting: Family Medicine

## 2018-08-06 VITALS — BP 138/88 | Temp 98.4°F | Ht 72.0 in | Wt 255.0 lb

## 2018-08-06 DIAGNOSIS — Z13220 Encounter for screening for lipoid disorders: Secondary | ICD-10-CM

## 2018-08-06 DIAGNOSIS — J441 Chronic obstructive pulmonary disease with (acute) exacerbation: Secondary | ICD-10-CM

## 2018-08-06 DIAGNOSIS — Z125 Encounter for screening for malignant neoplasm of prostate: Secondary | ICD-10-CM

## 2018-08-06 DIAGNOSIS — Z79899 Other long term (current) drug therapy: Secondary | ICD-10-CM | POA: Diagnosis not present

## 2018-08-06 MED ORDER — VARENICLINE TARTRATE 0.5 MG X 11 & 1 MG X 42 PO MISC: ORAL | 0 refills | Status: DC

## 2018-08-06 MED ORDER — AMOXICILLIN-POT CLAVULANATE 875-125 MG PO TABS: 1.0000 | ORAL_TABLET | Freq: Two times a day (BID) | ORAL | 0 refills | Status: DC

## 2018-08-06 MED ORDER — PREDNISONE 20 MG PO TABS: ORAL_TABLET | ORAL | 0 refills | Status: DC

## 2018-08-06 MED ORDER — VARENICLINE TARTRATE 1 MG PO TABS: 1.0000 mg | ORAL_TABLET | Freq: Two times a day (BID) | ORAL | 1 refills | Status: DC

## 2018-08-06 MED ORDER — BUDESONIDE-FORMOTEROL FUMARATE 80-4.5 MCG/ACT IN AERO: 2.0000 | INHALATION_SPRAY | Freq: Two times a day (BID) | RESPIRATORY_TRACT | 5 refills | Status: DC

## 2018-08-06 NOTE — Progress Notes (Signed)
   Subjective:    Patient ID: James Serrano, male    DOB: 09-17-1965, 52 y.o.   MRN: [id number]  HPI  Patient is here today with complaints of a cough and chest congestion since last week. He says he saw an urgent care last Tuesday week ago on 07/27/2018.He says they told him he had upper respiratory infection and gave him Levofloxacin 750 for 7 days,also gave prednisone two pills bid for five days.( does not know the mg).He says he is breathing better,but he is still tired,and has head congestion,coughing,some sob. Patient does have COPD he does smoke he knows he needs to quit Review of Systems  Constitutional: Positive for fatigue. Negative for activity change, chills and fever.  HENT: Positive for congestion and rhinorrhea. Negative for ear pain.   Eyes: Negative for discharge.  Respiratory: Positive for cough, shortness of breath and wheezing.   Cardiovascular: Negative for chest pain and leg swelling.  Gastrointestinal: Negative for nausea and vomiting.  Musculoskeletal: Negative for arthralgias.       Objective:   Physical Exam Vitals signs and nursing note reviewed.  Constitutional:      Appearance: He is well-developed.  HENT:     Head: Normocephalic.     Mouth/Throat:     Pharynx: No oropharyngeal exudate.  Neck:     Musculoskeletal: Normal range of motion.  Cardiovascular:     Rate and Rhythm: Normal rate and regular rhythm.     Heart sounds: Normal heart sounds. No murmur.  Pulmonary:     Effort: Pulmonary effort is normal.     Breath sounds: Wheezing present.  Lymphadenopathy:     Cervical: No cervical adenopathy.  Skin:    General: Skin is warm and dry.  Neurological:     Motor: No abnormal muscle tone.     Wheezing bilateral not severe distress      Assessment & Plan:  COPD exacerbation Antibiotics prednisone albuterol Add Symbicort Follow-up if progressive troubles Wellness checkup somewhere in the early 2020 Lab work ordered

## 2018-09-17 ENCOUNTER — Encounter: Payer: Self-pay | Admitting: Family Medicine

## 2018-09-17 ENCOUNTER — Ambulatory Visit: Payer: BLUE CROSS/BLUE SHIELD | Admitting: Family Medicine

## 2018-09-17 VITALS — BP 128/78 | Temp 97.8°F | Wt 257.0 lb

## 2018-09-17 DIAGNOSIS — J019 Acute sinusitis, unspecified: Secondary | ICD-10-CM

## 2018-09-17 DIAGNOSIS — B9689 Other specified bacterial agents as the cause of diseases classified elsewhere: Secondary | ICD-10-CM | POA: Diagnosis not present

## 2018-09-17 DIAGNOSIS — J069 Acute upper respiratory infection, unspecified: Secondary | ICD-10-CM

## 2018-09-17 MED ORDER — ALBUTEROL SULFATE HFA 108 (90 BASE) MCG/ACT IN AERS: INHALATION_SPRAY | RESPIRATORY_TRACT | 5 refills | Status: DC

## 2018-09-17 MED ORDER — AMOXICILLIN-POT CLAVULANATE 875-125 MG PO TABS: 1.0000 | ORAL_TABLET | Freq: Two times a day (BID) | ORAL | 0 refills | Status: DC

## 2018-09-17 NOTE — Progress Notes (Signed)
   Subjective:    Patient ID: James Serrano, male    DOB: [birth year], 53 y.o.   MRN: [id number]  Cough  This is a recurrent problem. The current episode started 1 to 4 weeks ago. The cough is productive of sputum. Associated symptoms include rhinorrhea. Pertinent negatives include no chest pain, chills, ear pain, fever or wheezing. Associated symptoms comments: Sinus congestion. Treatments tried: mucinex, dayquil.  Patient head congestion drainage coughing sinus pressure pain discomfort present over the past 10 days no wheezing or difficulty breathing PMH benign, COPD    Review of Systems  Constitutional: Negative for activity change, chills and fever.  HENT: Positive for congestion and rhinorrhea. Negative for ear pain.   Eyes: Negative for discharge.  Respiratory: Positive for cough. Negative for wheezing.   Cardiovascular: Negative for chest pain.  Gastrointestinal: Negative for nausea and vomiting.  Musculoskeletal: Negative for arthralgias.       Objective:   Physical Exam Vitals signs and nursing note reviewed.  Constitutional:      Appearance: He is well-developed.  HENT:     Head: Normocephalic.     Mouth/Throat:     Pharynx: No oropharyngeal exudate.  Neck:     Musculoskeletal: Normal range of motion.  Cardiovascular:     Rate and Rhythm: Normal rate and regular rhythm.     Heart sounds: Normal heart sounds. No murmur.  Pulmonary:     Effort: Pulmonary effort is normal.     Breath sounds: Normal breath sounds. No wheezing.  Lymphadenopathy:     Cervical: No cervical adenopathy.  Skin:    General: Skin is warm and dry.  Neurological:     Motor: No abnormal muscle tone.     Mild maxillary sinus tenderness      Assessment & Plan:  Sinusitis Antibiotic prescribed warning signs discussed follow-up ongoing trouble no sign of pneumonia or breathing attack per se

## 2018-10-05 ENCOUNTER — Telehealth: Payer: Self-pay | Admitting: Family Medicine

## 2018-10-05 NOTE — Telephone Encounter (Signed)
Does not sound like we need to do anything else at this point

## 2018-10-05 NOTE — Telephone Encounter (Signed)
Contacted [employer] regarding fax we received stating that a new PA symbicort was required but this requirement has been delayed until April 2020. Contacted insurance to see what needed to be done; no PA is needed at this time. Only time a PA is to be done is if patient is receiving more than one inhaler per month. Pharmacy at [facility] states that patient picked this up on 09/11/2018 with $40 copay.

## 2018-10-15 ENCOUNTER — Encounter: Payer: BLUE CROSS/BLUE SHIELD | Admitting: Family Medicine

## 2019-02-10 ENCOUNTER — Telehealth: Payer: Self-pay | Admitting: General Practice

## 2019-02-10 ENCOUNTER — Other Ambulatory Visit: Payer: Self-pay

## 2019-02-10 ENCOUNTER — Telehealth: Payer: Self-pay | Admitting: *Deleted

## 2019-02-10 ENCOUNTER — Ambulatory Visit (INDEPENDENT_AMBULATORY_CARE_PROVIDER_SITE_OTHER): Payer: BC Managed Care – PPO | Admitting: Family Medicine

## 2019-02-10 ENCOUNTER — Telehealth: Payer: Self-pay | Admitting: Family Medicine

## 2019-02-10 DIAGNOSIS — J310 Chronic rhinitis: Secondary | ICD-10-CM

## 2019-02-10 DIAGNOSIS — Z20822 Contact with and (suspected) exposure to covid-19: Secondary | ICD-10-CM

## 2019-02-10 NOTE — Progress Notes (Signed)
   Subjective:    Patient ID: James Serrano, male    DOB: 10/25/1965, 53 y.o.   MRN: [id number] Phone visit video not capable Cough This is a new problem. The current episode started in the past 7 days. Associated symptoms include nasal congestion.  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   Virtual Visit via Video Note  I connected with James Serrano on 02/10/19 at 11:00 AM EDT by a video enabled telemedicine application and verified that I am speaking with the correct person using two identifiers.  Location: Patient: home Provider: office   I discussed the limitations of evaluation and management by telemedicine and the availability of in person appointments. The patient expressed understanding and agreed to proceed.  History of Present Illness:    Observations/Objective:   Assessment and Plan:   Follow Up Instructions:    I discussed the assessment and treatment plan with the patient. The patient was provided an opportunity to ask questions and all were answered. The patient agreed with the plan and demonstrated an understanding of the instructions.   The patient was advised to call back or seek an in-person evaluation if the symptoms worsen or if the condition fails to improve as anticipated.  I provided 15 minutes of non-face-to-face time during this encounter.    Please see above     Objective:   Physical Exam Today's visit was via telephone Physical exam was not possible for this visit        Assessment & Plan:  COVID testing recommended-we will connect with the testing center I  doubt that his current symptoms are COVID Warning signs regarding what to watch for were discussed in detail Should he start having any of those it would be highly important for him to pull out from work and stay at home and notify us for home quarantine instructions Warning signs regarding hospitalization/ER were discussed with the patient

## 2019-02-10 NOTE — Telephone Encounter (Signed)
Sent for covid testing. Await results.

## 2019-02-10 NOTE — Telephone Encounter (Signed)
Pt has been scheduled for tomorrow for covid testing

## 2019-02-10 NOTE — Telephone Encounter (Signed)
Pt has been scheduled for covid testing for 02/11/19 per pt request.  Pt was referred by: Kathyrn Drown, MD

## 2019-02-10 NOTE — Telephone Encounter (Signed)
Pt had appt today with dr Nicki Reaper

## 2019-02-10 NOTE — Addendum Note (Signed)
Addended by: Dimple Nanas on: 02/10/2019 12:40 PM   Modules accepted: Orders

## 2019-02-10 NOTE — Telephone Encounter (Signed)
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 Wife would like to know if he should be tested. He does have cough.   Pt is out of town working in [employer]

## 2019-02-10 NOTE — Telephone Encounter (Signed)
Pt is having fatigue and cough for about one week. No fever, no sob. Working out of town but will come back home today. Would like to get a test for covid

## 2019-02-10 NOTE — Telephone Encounter (Signed)
Please put him on for virtual visit or phone visit I can do this during lunch please go ahead with setting it up

## 2019-02-10 NOTE — Telephone Encounter (Signed)
Pt had virtual appt today and needs covid 19 testing. Please call him and set up appt for tomorrow. Thanks

## 2019-02-11 ENCOUNTER — Other Ambulatory Visit: Payer: BLUE CROSS/BLUE SHIELD

## 2019-02-11 DIAGNOSIS — Z20822 Contact with and (suspected) exposure to covid-19: Secondary | ICD-10-CM

## 2019-02-16 LAB — NOVEL CORONAVIRUS, NAA: SARS-CoV-2, NAA: NOT DETECTED

## 2019-02-16 NOTE — Telephone Encounter (Addendum)
Patient was notified of  negative covid results by covid testing center 02/16/19- see results

## 2019-03-11 ENCOUNTER — Telehealth: Payer: Self-pay | Admitting: *Deleted

## 2019-03-11 NOTE — Telephone Encounter (Signed)
Received form from pharmacy stating Chantix is not covered by patient's insurance. Please advise.

## 2019-03-12 NOTE — Telephone Encounter (Signed)
The patient will need to be aware that his insurance does not cover it More than likely will have to pay for this out of pocket Other choices would include using OTC nicotine patches

## 2019-03-14 NOTE — Telephone Encounter (Signed)
Pt contacted and verbalized understanding.  

## 2019-04-14 ENCOUNTER — Other Ambulatory Visit: Payer: Self-pay | Admitting: Family Medicine

## 2019-05-02 ENCOUNTER — Other Ambulatory Visit: Payer: Self-pay

## 2019-05-02 ENCOUNTER — Ambulatory Visit (INDEPENDENT_AMBULATORY_CARE_PROVIDER_SITE_OTHER): Payer: BC Managed Care – PPO

## 2019-05-02 ENCOUNTER — Ambulatory Visit (HOSPITAL_COMMUNITY): Payer: BC Managed Care – PPO

## 2019-05-02 ENCOUNTER — Ambulatory Visit: Payer: BC Managed Care – PPO

## 2019-05-02 ENCOUNTER — Ambulatory Visit
Admission: EM | Admit: 2019-05-02 | Discharge: 2019-05-02 | Disposition: A | Discharge status: Home / Self Care | Payer: BC Managed Care – PPO | Attending: Emergency Medicine | Admitting: Emergency Medicine

## 2019-05-02 DIAGNOSIS — M25531 Pain in right wrist: Secondary | ICD-10-CM | POA: Diagnosis not present

## 2019-05-02 DIAGNOSIS — S59911A Unspecified injury of right forearm, initial encounter: Secondary | ICD-10-CM | POA: Diagnosis not present

## 2019-05-02 DIAGNOSIS — W19XXXA Unspecified fall, initial encounter: Secondary | ICD-10-CM

## 2019-05-02 DIAGNOSIS — S6991XA Unspecified injury of right wrist, hand and finger(s), initial encounter: Secondary | ICD-10-CM | POA: Diagnosis not present

## 2019-05-02 DIAGNOSIS — W010XXA Fall on same level from slipping, tripping and stumbling without subsequent striking against object, initial encounter: Secondary | ICD-10-CM | POA: Diagnosis not present

## 2019-05-02 DIAGNOSIS — M79631 Pain in right forearm: Secondary | ICD-10-CM | POA: Diagnosis not present

## 2019-05-02 MED ORDER — NAPROXEN 375 MG PO TABS: 375.0000 mg | ORAL_TABLET | Freq: Two times a day (BID) | ORAL | 0 refills | Status: DC

## 2019-05-02 NOTE — ED Provider Notes (Addendum)
[place]   [id number] 05/02/19 Arrival Time: 4098  CC: RT wrist PAIN  SUBJECTIVE: History from: patient. James Serrano is a 53 y.o. male complains of right wrist pain that began last night.  Symptoms began after he slipped and fell over a jack, landing on his RT outstretched hand.  Localizes the pain to the inside of RT wrist.  Describes the pain as constant and 6/10.  Has tried OTC medications without relief.  Symptoms are made worse with wrist ROM.  Denies similar symptoms in the past.  Denies fever, chills, erythema, ecchymosis, effusion, weakness, numbness and tingling.   ROS: As per HPI.  All other pertinent ROS negative.     Past Medical History:  Diagnosis Date  . Bronchitis   . Tobacco use    Past Surgical History:  Procedure Laterality Date  . ORIF ACETABULAR FRACTURE    . TONSILLECTOMY     No Known Allergies No current facility-administered medications on file prior to encounter.    Current Outpatient Medications on File Prior to Encounter  Medication Sig Dispense Refill  . albuterol (VENTOLIN HFA) 108 (90 Base) MCG/ACT inhaler INHALE 2 PUFFS BY MOUTH EVERY 6 HOURS AS NEEDED FOR WHEEZING 18 g 0  . budesonide-formoterol (SYMBICORT) 80-4.5 MCG/ACT inhaler Inhale 2 puffs into the lungs 2 (two) times daily. 1 Inhaler 5  . varenicline (CHANTIX STARTING MONTH PAK) 0.5 MG X 11 & 1 MG X 42 tablet Take one 0.5 mg tablet by mouth once daily for 3 days, then increase to one 0.5 mg tablet twice daily for 4 days, then increase to one 1 mg tablet twice daily. 53 tablet 0  . [DISCONTINUED] pantoprazole (PROTONIX) 40 MG tablet Take 1 tablet (40 mg total) by mouth daily. 30 tablet 5   Social History   Socioeconomic History  . Marital status: Married    Spouse name: Not on file  . Number of children: Not on file  . Years of education: Not on file  . Highest education level: Not on file  Occupational History  . Occupation: Pipe    Comment: Probation officer   Social Needs  . Financial resource strain: Not on file  . Food insecurity    Worry: Not on file    Inability: Not on file  . Transportation needs    Medical: Not on file    Non-medical: Not on file  Tobacco Use  . Smoking status: Current Every Day Smoker    Packs/day: 1.00    Years: 30.00    Pack years: 30.00    Types: Cigarettes  . Smokeless tobacco: Never Used  Substance and Sexual Activity  . Alcohol use: Yes    Comment: occasionally  . Drug use: No  . Sexual activity: Not on file  Lifestyle  . Physical activity    Days per week: Not on file    Minutes per session: Not on file  . Stress: Not on file  Relationships  . Social Herbalist on phone: Not on file    Gets together: Not on file    Attends religious service: Not on file    Active member of club or organization: Not on file    Attends meetings of clubs or organizations: Not on file    Relationship status: Not on file  . Intimate partner violence    Fear of current or ex partner: Not on file    Emotionally abused: Not on file    Physically  abused: Not on file    Forced sexual activity: Not on file  Other Topics Concern  . Not on file  Social History Narrative  . Not on file   Family History  Problem Relation Age of Onset  . Heart attack Father   . Cancer Father   . Cancer Brother     OBJECTIVE:  Vitals:   05/02/19 1452  BP: (!) 143/90  Pulse: 79  Resp: 18  Temp: 98.4 F (36.9 C)  SpO2: 94%    General appearance: ALERT; in no acute distress.  Head: NCAT Lungs: Normal respiratory effort CV: RT radial pulses 2+ . Cap refill < 2 seconds Musculoskeletal: RT wrist Inspection: mild diffuse swelling about the wrist Palpation: TTP over distal ulna ROM: LROM about the wrist; decreased supination/ pronation Strength: limited grip strength Skin: warm and dry Neurologic: Ambulates without difficulty; Sensation intact about the upper extremities Psychological: alert and cooperative; normal  mood and affect  DIAGNOSTIC STUDIES:  Dg Forearm Right  Result Date: 05/02/2019 CLINICAL DATA:  Patient tripped this morning and has pain along the lateral forearm. EXAM: RIGHT FOREARM - 2 VIEW COMPARISON:  None. FINDINGS: There is no evidence of fracture or other focal bone lesions. No evidence of dislocation. No radiopaque foreign body in the regional soft tissues. Possible fracture of the ulnar styloid process, incompletely evaluated on the current views. IMPRESSION: 1.  No acute osseous abnormality in the right forearm. 2. Possible fracture of the ulnar styloid process, incompletely assessed, attention on forthcoming wrist films. Electronically Signed   By: Audie Pinto M.D.   On: 05/02/2019 15:17   Dg Wrist Complete Right  Result Date: 05/02/2019 CLINICAL DATA:  Lateral right forearm and wrist pain following a fall this morning. EXAM: RIGHT WRIST - COMPLETE 3+ VIEW COMPARISON:  Right forearm radiographs obtained at the same time. FINDINGS: Minimal. 1st MCP joint degenerative changes mild-to-moderate 1st IP and 1st metacarpal/carpal joint degenerative changes. No fracture or dislocation. IMPRESSION: No fracture. Degenerative changes, as described above. Electronically Signed   By: Claudie Revering M.D.   On: 05/02/2019 15:41    My interpretation:  Forearm x-rays positive for possible RT distal ulna fracture of the styloid.  No soft tissue swelling.  Wrist x-rays negative for RT distal ulna styloid fracture.    I have reviewed the x-rays myself and the radiologist interpretation. I am in agreement with the radiologist interpretation.     ASSESSMENT & PLAN:  1. Injury of right wrist, initial encounter   2. Fall, initial encounter     Meds ordered this encounter  Medications  . naproxen (NAPROSYN) 375 MG tablet    Sig: Take 1 tablet (375 mg total) by mouth 2 (two) times daily.    Dispense:  20 tablet    Refill:  0    Order Specific Question:   Supervising Provider    Answer:   Raylene Everts [[id number]]   X-rays concerning for possible ulnar styloid fracture Splint placed Continue conservative management of rest, ice, and elevation Take naproxen as needed for pain relief (may cause abdominal discomfort, ulcers, and GI bleeds avoid taking with other NSAIDs) Follow up with orthopedist for further evaluation and management Return or go to the ER if you have any new or worsening symptoms (fever, chills, chest pain, abdominal pain, increased pain, redness, symptoms do not improve with treatments, etc...)   Reviewed expectations re: course of current medical issues. Questions answered. Outlined signs and symptoms indicating need for more acute  intervention. Patient verbalized understanding. After Visit Summary given.    Lestine Box, PA-C 05/02/19 [place], [place], PA-C 05/02/19 1553

## 2019-05-02 NOTE — ED Triage Notes (Signed)
Pt had trip and fall yesterday and landed on forearm

## 2019-05-02 NOTE — Discharge Instructions (Addendum)
X-rays concerning for possible ulnar styloid fracture Splint placed Continue conservative management of rest, ice, and elevation Take naproxen as needed for pain relief (may cause abdominal discomfort, ulcers, and GI bleeds avoid taking with other NSAIDs) Follow up with orthopedist for further evaluation and management Return or go to the ER if you have any new or worsening symptoms (fever, chills, chest pain, abdominal pain, increased pain, redness, symptoms do not improve with treatments, etc...)

## 2019-05-15 ENCOUNTER — Other Ambulatory Visit: Payer: Self-pay | Admitting: Family Medicine

## 2019-07-14 ENCOUNTER — Other Ambulatory Visit: Payer: Self-pay | Admitting: Family Medicine

## 2019-08-24 ENCOUNTER — Ambulatory Visit: Payer: BC Managed Care – PPO | Attending: Internal Medicine

## 2019-09-16 ENCOUNTER — Other Ambulatory Visit: Payer: Self-pay

## 2019-09-16 ENCOUNTER — Ambulatory Visit: Payer: BC Managed Care – PPO | Attending: Internal Medicine

## 2019-09-16 DIAGNOSIS — Z20822 Contact with and (suspected) exposure to covid-19: Secondary | ICD-10-CM | POA: Insufficient documentation

## 2019-09-17 LAB — NOVEL CORONAVIRUS, NAA: SARS-CoV-2, NAA: NOT DETECTED

## 2019-10-20 ENCOUNTER — Ambulatory Visit
Admission: EM | Admit: 2019-10-20 | Discharge: 2019-10-20 | Disposition: A | Discharge status: Home / Self Care | Payer: BC Managed Care – PPO | Attending: Emergency Medicine | Admitting: Emergency Medicine

## 2019-10-20 ENCOUNTER — Other Ambulatory Visit: Payer: Self-pay | Admitting: Family Medicine

## 2019-10-20 ENCOUNTER — Other Ambulatory Visit: Payer: Self-pay

## 2019-10-20 DIAGNOSIS — R6889 Other general symptoms and signs: Secondary | ICD-10-CM

## 2019-10-20 DIAGNOSIS — Z20822 Contact with and (suspected) exposure to covid-19: Secondary | ICD-10-CM | POA: Diagnosis not present

## 2019-10-20 LAB — POC SARS CORONAVIRUS 2 AG -  ED: SARS Coronavirus 2 Ag: NEGATIVE

## 2019-10-20 MED ORDER — DEXAMETHASONE SODIUM PHOSPHATE 10 MG/ML IJ SOLN
10.0000 mg | Freq: Once | INTRAMUSCULAR | Status: AC
  Administered 2019-10-20: 14:00:00 10 mg via INTRAMUSCULAR

## 2019-10-20 MED ORDER — PREDNISONE 10 MG (21) PO TBPK: ORAL_TABLET | Freq: Every day | ORAL | 0 refills | Status: DC

## 2019-10-20 MED ORDER — CETIRIZINE HCL 10 MG PO TABS: 10.0000 mg | ORAL_TABLET | Freq: Every day | ORAL | 0 refills | Status: DC

## 2019-10-20 MED ORDER — ALBUTEROL SULFATE HFA 108 (90 BASE) MCG/ACT IN AERS: 1.0000 | INHALATION_SPRAY | Freq: Four times a day (QID) | RESPIRATORY_TRACT | 0 refills | Status: DC | PRN

## 2019-10-20 MED ORDER — FLUTICASONE PROPIONATE 50 MCG/ACT NA SUSP: 2.0000 | Freq: Every day | NASAL | 0 refills | Status: DC

## 2019-10-20 MED ORDER — BENZONATATE 100 MG PO CAPS: 100.0000 mg | ORAL_CAPSULE | Freq: Three times a day (TID) | ORAL | 0 refills | Status: DC

## 2019-10-20 NOTE — ED Provider Notes (Signed)
[place]   [id number] 10/20/19 Arrival Time: 7829   CC: COVID symptoms  SUBJECTIVE: History from: patient.  James Serrano is a 54 y.o. male who presents with runny nose, congestion, productive cough with yellow sputum, SOB, and diarrhea x 3 days.  Admits to positive COVID exposure at work. Has tried OTC medications without relief.  Symptoms are made worse in the morning.  Reports previous symptoms in the past with viral uri.   Denies fever, chills, sore throat, wheezing, chest pain, nausea, changes in bowel or bladder habits.    Admits to tobacco use.  1 PPD x 20 years.    ROS: As per HPI.  All other pertinent ROS negative.     Past Medical History:  Diagnosis Date  . Bronchitis   . Tobacco use    Past Surgical History:  Procedure Laterality Date  . ORIF ACETABULAR FRACTURE    . TONSILLECTOMY     No Known Allergies No current facility-administered medications on file prior to encounter.   Current Outpatient Medications on File Prior to Encounter  Medication Sig Dispense Refill  . albuterol (VENTOLIN HFA) 108 (90 Base) MCG/ACT inhaler INHALE 2 PUFFS BY MOUTH EVERY 6 HOURS AS NEEDED FOR WHEEZING 18 g 0  . naproxen (NAPROSYN) 375 MG tablet Take 1 tablet (375 mg total) by mouth 2 (two) times daily. 20 tablet 0  . SYMBICORT 80-4.5 MCG/ACT inhaler Inhale 2 puffs by mouth twice daily 11 g 2  . [DISCONTINUED] pantoprazole (PROTONIX) 40 MG tablet Take 1 tablet (40 mg total) by mouth daily. 30 tablet 5   Social History   Socioeconomic History  . Marital status: Married    Spouse name: Not on file  . Number of children: Not on file  . Years of education: Not on file  . Highest education level: Not on file  Occupational History  . Occupation: Pipe    Comment: Probation officer  Tobacco Use  . Smoking status: Current Every Day Smoker    Packs/day: 1.00    Years: 30.00    Pack years: 30.00    Types: Cigarettes  . Smokeless tobacco: Never  Used  Substance and Sexual Activity  . Alcohol use: Yes    Comment: occasionally  . Drug use: No  . Sexual activity: Not on file  Other Topics Concern  . Not on file  Social History Narrative  . Not on file   Social Determinants of Health   Financial Resource Strain:   . Difficulty of Paying Living Expenses: Not on file  Food Insecurity:   . Worried About Charity fundraiser in the Last Year: Not on file  . Ran Out of Food in the Last Year: Not on file  Transportation Needs:   . Lack of Transportation (Medical): Not on file  . Lack of Transportation (Non-Medical): Not on file  Physical Activity:   . Days of Exercise per Week: Not on file  . Minutes of Exercise per Session: Not on file  Stress:   . Feeling of Stress : Not on file  Social Connections:   . Frequency of Communication with Friends and Family: Not on file  . Frequency of Social Gatherings with Friends and Family: Not on file  . Attends Religious Services: Not on file  . Active Member of Clubs or Organizations: Not on file  . Attends Archivist Meetings: Not on file  . Marital Status: Not on file  Intimate Partner Violence:   .  Fear of Current or Ex-Partner: Not on file  . Emotionally Abused: Not on file  . Physically Abused: Not on file  . Sexually Abused: Not on file   Family History  Problem Relation Age of Onset  . Heart attack Father   . Cancer Father   . Cancer Brother   . Cancer Mother     OBJECTIVE:  Vitals:   10/20/19 1332  BP: (!) 148/97  Pulse: 73  Resp: 20  Temp: 98.9 F (37.2 C)  SpO2: 94%     General appearance: alert; appears fatigued, but nontoxic; speaking in full sentences and tolerating own secretions HEENT: NCAT; Ears: EACs clear, TMs pearly gray; Eyes: PERRL.  EOM grossly intact. Nose: nares patent without rhinorrhea, Throat: oropharynx clear, tonsils non erythematous or enlarged, uvula midline  Neck: supple without LAD Lungs: unlabored respirations, symmetrical  air entry; cough: mild; no respiratory distress; diffuse wheezes appreciated over bilateral lung fields Heart: regular rate and rhythm.   Skin: warm and dry Psychological: alert and cooperative; normal mood and affect  LABS:  Results for orders placed or performed during the hospital encounter of 10/20/19 (from the past 24 hour(s))  POC SARS Coronavirus 2 Ag-ED - Nasal Swab (BD Veritor Kit)     Status: None   Collection Time: 10/20/19  1:39 PM  Result Value Ref Range   SARS Coronavirus 2 Ag Negative Negative     ASSESSMENT & PLAN:  1. Suspected COVID-19 virus infection   2. Flu-like symptoms   3. Exposure to COVID-19 virus     Meds ordered this encounter  Medications  . cetirizine (ZYRTEC) 10 MG tablet    Sig: Take 1 tablet (10 mg total) by mouth daily.    Dispense:  30 tablet    Refill:  0    Order Specific Question:   Supervising Provider    Answer:   Raylene Everts [[id number]]  . fluticasone (FLONASE) 50 MCG/ACT nasal spray    Sig: Place 2 sprays into both nostrils daily.    Dispense:  16 g    Refill:  0    Order Specific Question:   Supervising Provider    Answer:   Raylene Everts [[id number]]  . benzonatate (TESSALON) 100 MG capsule    Sig: Take 1 capsule (100 mg total) by mouth every 8 (eight) hours.    Dispense:  21 capsule    Refill:  0    Order Specific Question:   Supervising Provider    Answer:   Raylene Everts [[id number]]  . dexamethasone (DECADRON) injection 10 mg  . predniSONE (STERAPRED UNI-PAK 21 TAB) 10 MG (21) TBPK tablet    Sig: Take by mouth daily. Take 6 tabs by mouth daily  for 2 days, then 5 tabs for 2 days, then 4 tabs for 2 days, then 3 tabs for 2 days, 2 tabs for 2 days, then 1 tab by mouth daily for 2 days    Dispense:  42 tablet    Refill:  0    Order Specific Question:   Supervising Provider    Answer:   Raylene Everts [[id number]]  . albuterol (VENTOLIN HFA) 108 (90 Base) MCG/ACT inhaler    Sig: Inhale 1-2 puffs into the lungs every  6 (six) hours as needed for wheezing or shortness of breath.    Dispense:  18 g    Refill:  0    Order Specific Question:   Supervising Provider    Answer:   Meda Coffee,  YVONNE SUE [[id number]]   Rapid COVID negative.  Culture sent.  Patient should remain in quarantine until they have received culture results.  If negative you may resume normal activities (go back to work/school) while practicing hand hygiene, social distance, and mask wearing.  If positive, patient should remain in quarantine for 10 days from symptom onset AND greater than 72 hours after symptoms resolution (absence of fever without the use of fever-reducing medication and improvement in respiratory symptoms), whichever is longer Get plenty of rest and push fluids Zyrtec for nasal congestion, runny nose, and/or sore throat Flonase for nasal congestion and runny nose Tessalon perles for cough Use medications daily for symptom relief Use OTC medications like ibuprofen or tylenol as needed fever or pain Call or go to the ED if you have any new or worsening symptoms such as fever, worsening cough, shortness of breath, chest tightness, chest pain, turning blue, changes in mental status, etc...    Reviewed expectations re: course of current medical issues. Questions answered. Outlined signs and symptoms indicating need for more acute intervention. Patient verbalized understanding. After Visit Summary given.         Lestine Box, PA-C 10/20/19 1412

## 2019-10-20 NOTE — Discharge Instructions (Signed)
Rapid COVID negative.  Culture sent.  Patient should remain in quarantine until they have received culture results.  If negative you may resume normal activities (go back to work/school) while practicing hand hygiene, social distance, and mask wearing.  If positive, patient should remain in quarantine for 10 days from symptom onset AND greater than 72 hours after symptoms resolution (absence of fever without the use of fever-reducing medication and improvement in respiratory symptoms), whichever is longer Get plenty of rest and push fluids Zyrtec for nasal congestion, runny nose, and/or sore throat Flonase for nasal congestion and runny nose Tessalon perles for cough Use medications daily for symptom relief Use OTC medications like ibuprofen or tylenol as needed fever or pain Call or go to the ED if you have any new or worsening symptoms such as fever, worsening cough, shortness of breath, chest tightness, chest pain, turning blue, changes in mental status, etc..Marland Kitchen

## 2019-10-20 NOTE — Telephone Encounter (Signed)
Last check up on copd dec 2019

## 2019-10-20 NOTE — Telephone Encounter (Signed)
May have this +1 additional refill needs to do a follow-up visit within the next 60 days thank you virtual or in person

## 2019-10-20 NOTE — ED Triage Notes (Signed)
Pt presents with c/o cough , congestion and diarrhea since Monday , pt has had positive covid exposure at work

## 2019-10-21 LAB — NOVEL CORONAVIRUS, NAA: SARS-CoV-2, NAA: NOT DETECTED

## 2020-01-13 ENCOUNTER — Institutional Professional Consult (permissible substitution): Payer: BC Managed Care – PPO | Admitting: Internal Medicine

## 2020-02-11 ENCOUNTER — Other Ambulatory Visit: Payer: Self-pay | Admitting: Family Medicine

## 2020-02-13 NOTE — Telephone Encounter (Signed)
Please schedule and then send back to nurses to send in refill. thanks

## 2020-02-13 NOTE — Telephone Encounter (Signed)
May have 2 refills needs to do a follow-up this summer

## 2020-02-13 NOTE — Telephone Encounter (Signed)
lvm to schedule appt.  

## 2020-02-14 NOTE — Telephone Encounter (Signed)
Scheduled 7/23

## 2020-03-16 ENCOUNTER — Encounter: Payer: Self-pay | Admitting: Family Medicine

## 2020-03-16 ENCOUNTER — Ambulatory Visit: Payer: BC Managed Care – PPO | Admitting: Family Medicine

## 2020-03-16 ENCOUNTER — Other Ambulatory Visit: Payer: Self-pay | Admitting: Family Medicine

## 2020-03-16 ENCOUNTER — Other Ambulatory Visit: Payer: Self-pay

## 2020-03-16 VITALS — BP 132/82 | Temp 97.3°F | Wt 256.0 lb

## 2020-03-16 DIAGNOSIS — J441 Chronic obstructive pulmonary disease with (acute) exacerbation: Secondary | ICD-10-CM | POA: Diagnosis not present

## 2020-03-16 DIAGNOSIS — E781 Pure hyperglyceridemia: Secondary | ICD-10-CM | POA: Diagnosis not present

## 2020-03-16 DIAGNOSIS — Z0000 Encounter for general adult medical examination without abnormal findings: Secondary | ICD-10-CM | POA: Diagnosis not present

## 2020-03-16 DIAGNOSIS — Z125 Encounter for screening for malignant neoplasm of prostate: Secondary | ICD-10-CM | POA: Diagnosis not present

## 2020-03-16 DIAGNOSIS — E782 Mixed hyperlipidemia: Secondary | ICD-10-CM | POA: Diagnosis not present

## 2020-03-16 MED ORDER — SYMBICORT 80-4.5 MCG/ACT IN AERO: INHALATION_SPRAY | RESPIRATORY_TRACT | 5 refills | Status: DC

## 2020-03-16 MED ORDER — ALBUTEROL SULFATE HFA 108 (90 BASE) MCG/ACT IN AERS: INHALATION_SPRAY | RESPIRATORY_TRACT | 5 refills | Status: DC

## 2020-03-16 NOTE — Progress Notes (Signed)
   Subjective:    Patient ID: James Serrano, male    DOB: [birth year], 55 y.o.   MRN: [id number]  HPI Pt here today for follow up. Pt is taking Ventolin and Symbicort inhaler. Pt also taking Zyrtec and Flonase.  Pt states that he has some anxiety about going over bridges Noticed about 2 years ago but is getting worse. Pt not able to drive over bridges that have height to them. Pt can ride over bridges.  Pt right hand begins to shake when he begins to work fast or gets an uncomfortable feeling.    Review of Systems     Objective:   Physical Exam Lungs clear heart regular HEENT benign       Assessment & Plan:  Anxiety related issues Hold off on any type of nerve pill Recommend cognitive behavioral therapy Patient hesitant about this Printout given on how to deal with stresses and phobias  Refills on his COPD medicine Patient encouraged to quit smoking To do lab work in the fall with a wellness visit Colonoscopy encouraged

## 2020-03-17 LAB — LIPID PANEL
Chol/HDL Ratio: 3.5 ratio (ref 0.0–5.0)
Cholesterol, Total: 186 mg/dL (ref 100–199)
HDL: 53 mg/dL (ref >39)
LDL Chol Calc (NIH): 112 mg/dL — ABNORMAL HIGH (ref 0–99)
Triglycerides: 119 mg/dL (ref 0–149)
VLDL Cholesterol Cal: 21 mg/dL (ref 5–40)

## 2020-03-17 LAB — HEPATIC FUNCTION PANEL
ALT: 20 IU/L (ref 0–44)
AST: 18 IU/L (ref 0–40)
Albumin: 5 g/dL — ABNORMAL HIGH (ref 3.8–4.9)
Alkaline Phosphatase: 72 IU/L (ref 48–121)
Bilirubin Total: 0.4 mg/dL (ref 0.0–1.2)
Bilirubin, Direct: 0.13 mg/dL (ref 0.00–0.40)
Total Protein: 7.6 g/dL (ref 6.0–8.5)

## 2020-03-17 LAB — BASIC METABOLIC PANEL
BUN/Creatinine Ratio: 7 — ABNORMAL LOW (ref 9–20)
BUN: 7 mg/dL (ref 6–24)
CO2: 26 mmol/L (ref 20–29)
Calcium: 9.8 mg/dL (ref 8.7–10.2)
Chloride: 99 mmol/L (ref 96–106)
Creatinine, Ser: 0.94 mg/dL (ref 0.76–1.27)
GFR calc Af Amer: 105 mL/min/{1.73_m2} (ref >59)
GFR calc non Af Amer: 91 mL/min/{1.73_m2} (ref >59)
Glucose: 104 mg/dL — ABNORMAL HIGH (ref 65–99)
Potassium: 4.9 mmol/L (ref 3.5–5.2)
Sodium: 139 mmol/L (ref 134–144)

## 2020-03-17 LAB — PSA: Prostate Specific Ag, Serum: 0.8 ng/mL (ref 0.0–4.0)

## 2020-03-20 ENCOUNTER — Encounter: Payer: Self-pay | Admitting: Family Medicine

## 2020-03-22 ENCOUNTER — Telehealth: Payer: BC Managed Care – PPO | Admitting: Emergency Medicine

## 2020-03-22 ENCOUNTER — Telehealth: Payer: Self-pay | Admitting: Family Medicine

## 2020-03-22 DIAGNOSIS — R059 Cough, unspecified: Secondary | ICD-10-CM

## 2020-03-22 DIAGNOSIS — R05 Cough: Secondary | ICD-10-CM | POA: Diagnosis not present

## 2020-03-22 MED ORDER — BENZONATATE 100 MG PO CAPS: 100.0000 mg | ORAL_CAPSULE | Freq: Three times a day (TID) | ORAL | 0 refills | Status: DC | PRN

## 2020-03-22 MED ORDER — PREDNISONE 20 MG PO TABS: 40.0000 mg | ORAL_TABLET | Freq: Every day | ORAL | 0 refills | Status: AC

## 2020-03-22 MED ORDER — DOXYCYCLINE HYCLATE 100 MG PO TABS: 100.0000 mg | ORAL_TABLET | Freq: Two times a day (BID) | ORAL | 0 refills | Status: DC

## 2020-03-22 NOTE — Progress Notes (Signed)
Time spent 10 min  We are sorry that you are not feeling well.  Here is how we plan to help!  Based on your presentation I believe you most likely have A cough due to bacteria.  When patients have a fever and a productive cough with a change in color or increased sputum production, we are concerned about bacterial bronchitis.  If left untreated it can progress to pneumonia.  If your symptoms do not improve with your treatment plan it is important that you contact your provider.   I have prescribed Doxycycline 100 mg twice a day for 7 days     In addition you may use A prescription cough medication called Tessalon Perles 100mg . You may take 1-2 capsules every 8 hours as needed for your cough. Prednisone 20 mg for 5 days.   From your responses in the eVisit questionnaire you describe inflammation in the upper respiratory tract which is causing a significant cough.  This is commonly called Bronchitis and has four common causes:    Allergies  Viral Infections  Acid Reflux  Bacterial Infection Allergies, viruses and acid reflux are treated by controlling symptoms or eliminating the cause. An example might be a cough caused by taking certain blood pressure medications. You stop the cough by changing the medication. Another example might be a cough caused by acid reflux. Controlling the reflux helps control the cough.  USE OF BRONCHODILATOR ("RESCUE") INHALERS: There is a risk from using your bronchodilator too frequently.  The risk is that over-reliance on a medication which only relaxes the muscles surrounding the breathing tubes can reduce the effectiveness of medications prescribed to reduce swelling and congestion of the tubes themselves.  Although you feel brief relief from the bronchodilator inhaler, your asthma may actually be worsening with the tubes becoming more swollen and filled with mucus.  This can delay other crucial treatments, such as oral steroid medications. If you need to use a  bronchodilator inhaler daily, several times per day, you should discuss this with your provider.  There are probably better treatments that could be used to keep your asthma under control.     HOME CARE . Only take medications as instructed by your medical team. . Complete the entire course of an antibiotic. . Drink plenty of fluids and get plenty of rest. . Avoid close contacts especially the very young and the elderly . Cover your mouth if you cough or cough into your sleeve. . Always remember to wash your hands . A steam or ultrasonic humidifier can help congestion.   GET HELP RIGHT AWAY IF: . You develop worsening fever. . You become short of breath . You cough up blood. . Your symptoms persist after you have completed your treatment plan MAKE SURE YOU   Understand these instructions.  Will watch your condition.  Will get help right away if you are not doing well or get worse.  Your e-visit answers were reviewed by a board certified advanced clinical practitioner to complete your personal care plan.  Depending on the condition, your plan could have included both over the counter or prescription medications. If there is a problem please reply  once you have received a response from your provider. Your safety is important to [place].  If you have drug allergies check your prescription carefully.    You can use MyChart to ask questions about today's visit, request a non-urgent call back, or ask for a work or school excuse for 24 hours related to  this e-Visit. If it has been greater than 24 hours you will need to follow up with your provider, or enter a new e-Visit to address those concerns. You will get an e-mail in the next two days asking about your experience.  I hope that your e-visit has been valuable and will speed your recovery. Thank you for using e-visits.

## 2020-03-22 NOTE — Telephone Encounter (Signed)
Patient was seen 03/16/20 for medication follow up and had E-visit today for cough and congestion and was prescribed Benzonatate 100 mg, Doxycycline Hyclate 100 mg, Prednisone can he take all three because he has never taken these before. Please advise

## 2020-03-23 NOTE — Telephone Encounter (Signed)
He can take all of these medications together but is best taken with a snack to lessen the risk of upset stomach  Certainly the patient has severe shortness of breath high fevers or significant progressive disease ER evaluation over the weekend would be recommended  If any other issues or questions please let me know thank you

## 2020-03-23 NOTE — Telephone Encounter (Signed)
Patient wife notified of Dr. Bary Leriche recommendation.

## 2020-03-30 ENCOUNTER — Ambulatory Visit: Payer: Self-pay

## 2020-04-02 ENCOUNTER — Ambulatory Visit: Payer: Self-pay

## 2020-06-29 ENCOUNTER — Encounter: Payer: BC Managed Care – PPO | Admitting: Family Medicine

## 2020-08-09 ENCOUNTER — Ambulatory Visit: Payer: Self-pay

## 2020-08-10 ENCOUNTER — Other Ambulatory Visit: Payer: Self-pay

## 2020-08-10 ENCOUNTER — Ambulatory Visit
Admission: RE | Admit: 2020-08-10 | Discharge: 2020-08-10 | Disposition: A | Discharge status: Home / Self Care | Payer: BC Managed Care – PPO | Source: Ambulatory Visit | Attending: Emergency Medicine | Admitting: Emergency Medicine

## 2020-08-10 VITALS — Ht 72.0 in | Wt 250.0 lb

## 2020-08-10 DIAGNOSIS — Z1152 Encounter for screening for COVID-19: Secondary | ICD-10-CM

## 2020-08-10 DIAGNOSIS — R062 Wheezing: Secondary | ICD-10-CM

## 2020-08-10 DIAGNOSIS — R52 Pain, unspecified: Secondary | ICD-10-CM

## 2020-08-10 DIAGNOSIS — J069 Acute upper respiratory infection, unspecified: Secondary | ICD-10-CM | POA: Diagnosis not present

## 2020-08-10 MED ORDER — PREDNISONE 10 MG PO TABS: 20.0000 mg | ORAL_TABLET | Freq: Every day | ORAL | 0 refills | Status: DC

## 2020-08-10 MED ORDER — FLUTICASONE PROPIONATE 50 MCG/ACT NA SUSP: 1.0000 | Freq: Every day | NASAL | 0 refills | Status: DC

## 2020-08-10 MED ORDER — CETIRIZINE HCL 10 MG PO TABS: 10.0000 mg | ORAL_TABLET | Freq: Every day | ORAL | 0 refills | Status: DC

## 2020-08-10 MED ORDER — BENZONATATE 100 MG PO CAPS: 100.0000 mg | ORAL_CAPSULE | Freq: Three times a day (TID) | ORAL | 0 refills | Status: DC | PRN

## 2020-08-10 NOTE — Discharge Instructions (Addendum)
  COVID testing ordered.  It will take between 2-7 days for test results.  Someone will contact you regarding abnormal results.    In the meantime: You should remain isolated in your home for 10 days from symptom onset AND greater than 24 hours after symptoms resolution (absence of fever without the use of fever-reducing medication and improvement in respiratory symptoms), whichever is longer Get plenty of rest and push fluids Tessalon Perles prescribed for cough zyrtec for nasal congestion, runny nose, and/or sore throat Flonase for nasal congestion and runny nose Prednisone was prescribed Use medications daily for symptom relief Use OTC medications like ibuprofen or tylenol as needed fever or pain Call or go to the ED if you have any new or worsening symptoms such as fever, worsening cough, shortness of breath, chest tightness, chest pain, turning blue, changes in mental status, etc..Marland Kitchen

## 2020-08-10 NOTE — ED Triage Notes (Signed)
Pt presents with c/o cough and body aches that began on Tuesday

## 2020-08-10 NOTE — ED Provider Notes (Signed)
[place]   [id number] 08/10/20 Arrival Time: 8413   CC: COVID symptoms  SUBJECTIVE: History from: patient.  James Serrano is a 54 y.o. male who presented to the urgent care for complaint of cough,  nasal congestion and body ache for the past 3 days.  Denies sick exposure to COVID, flu or strep.  Denies recent travel.  Has tried OTC medication without relief.  Denies aggravating factors.  Denies previous symptoms in the past.   Denies fever, chills, fatigue, sinus pain, rhinorrhea, sore throat, SOB, wheezing, chest pain, nausea, changes in bowel or bladder habits.    ROS: As per HPI.  All other pertinent ROS negative.     Past Medical History:  Diagnosis Date  . Bronchitis   . Tobacco use    Past Surgical History:  Procedure Laterality Date  . ORIF ACETABULAR FRACTURE    . TONSILLECTOMY     No Known Allergies No current facility-administered medications on file prior to encounter.   Current Outpatient Medications on File Prior to Encounter  Medication Sig Dispense Refill  . albuterol (VENTOLIN HFA) 108 (90 Base) MCG/ACT inhaler 2 puffs q4 prn 18 g 5  . doxycycline (VIBRA-TABS) 100 MG tablet Take 1 tablet (100 mg total) by mouth 2 (two) times daily. 20 tablet 0  . SYMBICORT 80-4.5 MCG/ACT inhaler INHALE 2 PUFFS BY MOUTH TWICE DAILY - NEEDS FOLLOW UP VISIT WITHIN 60 DAYS 11 g 5  . [DISCONTINUED] pantoprazole (PROTONIX) 40 MG tablet Take 1 tablet (40 mg total) by mouth daily. 30 tablet 5   Social History   Socioeconomic History  . Marital status: Married    Spouse name: Not on file  . Number of children: Not on file  . Years of education: Not on file  . Highest education level: Not on file  Occupational History  . Occupation: Pipe    Comment: Probation officer  Tobacco Use  . Smoking status: Current Every Day Smoker    Packs/day: 1.00    Years: 30.00    Pack years: 30.00    Types: Cigarettes  . Smokeless tobacco: Never Used   Substance and Sexual Activity  . Alcohol use: Yes    Comment: occasionally  . Drug use: No  . Sexual activity: Not on file  Other Topics Concern  . Not on file  Social History Narrative  . Not on file   Social Determinants of Health   Financial Resource Strain: Not on file  Food Insecurity: Not on file  Transportation Needs: Not on file  Physical Activity: Not on file  Stress: Not on file  Social Connections: Not on file  Intimate Partner Violence: Not on file   Family History  Problem Relation Age of Onset  . Heart attack Father   . Cancer Father   . Cancer Brother   . Cancer Mother     OBJECTIVE:  Vitals:   08/10/20 0903  Weight: 250 lb (113.4 kg)  Height: 6' (1.829 m)     General appearance: alert; appears fatigued, but nontoxic; speaking in full sentences and tolerating own secretions HEENT: NCAT; Ears: EACs clear, TMs pearly gray; Eyes: PERRL.  EOM grossly intact. Sinuses: nontender; Nose: nares patent without rhinorrhea, Throat: oropharynx clear, tonsils non erythematous or enlarged, uvula midline  Neck: supple without LAD Lungs: unlabored respirations, symmetrical air entry, wheezing present; cough: moderate; no respiratory distress  Heart: regular rate and rhythm.  Radial pulses 2+ symmetrical bilaterally Skin: warm and dry Psychological: alert  and cooperative; normal mood and affect  LABS:  No results found for this or any previous visit (from the past 24 hour(s)).   ASSESSMENT & PLAN:  1. Encounter for screening for COVID-19   2. URI with cough and congestion   3. Body aches   4. Wheezing     Meds ordered this encounter  Medications  . fluticasone (FLONASE) 50 MCG/ACT nasal spray    Sig: Place 1 spray into both nostrils daily for 14 days.    Dispense:  16 g    Refill:  0  . cetirizine (ZYRTEC ALLERGY) 10 MG tablet    Sig: Take 1 tablet (10 mg total) by mouth daily.    Dispense:  30 tablet    Refill:  0  . benzonatate (TESSALON) 100 MG  capsule    Sig: Take 1 capsule (100 mg total) by mouth 3 (three) times daily as needed for cough.    Dispense:  30 capsule    Refill:  0  . predniSONE (DELTASONE) 10 MG tablet    Sig: Take 2 tablets (20 mg total) by mouth daily.    Dispense:  15 tablet    Refill:  0   Discharge instructions...    COVID testing ordered.  It will take between 2-7 days for test results.  Someone will contact you regarding abnormal results.    In the meantime: You should remain isolated in your home for 10 days from symptom onset AND greater than 72 hours after symptoms resolution (absence of fever without the use of fever-reducing medication and improvement in respiratory symptoms), whichever is longer Get plenty of rest and push fluids Tessalon Perles prescribed for cough zyrtec for nasal congestion, runny nose, and/or sore throat Flonase for nasal congestion and runny nose Prednisone was prescribed Use medications daily for symptom relief Use OTC medications like ibuprofen or tylenol as needed fever or pain Call or go to the ED if you have any new or worsening symptoms such as fever, worsening cough, shortness of breath, chest tightness, chest pain, turning blue, changes in mental status, etc...   Reviewed expectations re: course of current medical issues. Questions answered. Outlined signs and symptoms indicating need for more acute intervention. Patient verbalized understanding. After Visit Summary given.         Emerson Monte, [place] 08/10/20 [phone]

## 2020-08-11 LAB — COVID-19, FLU A+B NAA
Influenza A, NAA: NOT DETECTED
Influenza B, NAA: NOT DETECTED
SARS-CoV-2, NAA: NOT DETECTED

## 2020-08-20 ENCOUNTER — Ambulatory Visit (INDEPENDENT_AMBULATORY_CARE_PROVIDER_SITE_OTHER): Payer: BC Managed Care – PPO

## 2020-08-20 ENCOUNTER — Ambulatory Visit
Admission: RE | Admit: 2020-08-20 | Discharge: 2020-08-20 | Disposition: A | Discharge status: Home / Self Care | Payer: BC Managed Care – PPO | Source: Ambulatory Visit | Attending: Internal Medicine | Admitting: Internal Medicine

## 2020-08-20 ENCOUNTER — Other Ambulatory Visit: Payer: Self-pay

## 2020-08-20 VITALS — BP 137/90 | HR 103 | Temp 98.3°F | Resp 22

## 2020-08-20 DIAGNOSIS — J42 Unspecified chronic bronchitis: Secondary | ICD-10-CM

## 2020-08-20 DIAGNOSIS — Z720 Tobacco use: Secondary | ICD-10-CM

## 2020-08-20 DIAGNOSIS — R059 Cough, unspecified: Secondary | ICD-10-CM | POA: Diagnosis not present

## 2020-08-20 DIAGNOSIS — J449 Chronic obstructive pulmonary disease, unspecified: Secondary | ICD-10-CM | POA: Diagnosis not present

## 2020-08-20 DIAGNOSIS — J441 Chronic obstructive pulmonary disease with (acute) exacerbation: Secondary | ICD-10-CM

## 2020-08-20 DIAGNOSIS — J811 Chronic pulmonary edema: Secondary | ICD-10-CM | POA: Diagnosis not present

## 2020-08-20 MED ORDER — HYDROCOD POLST-CPM POLST ER 10-8 MG/5ML PO SUER: 5.0000 mL | Freq: Two times a day (BID) | ORAL | 0 refills | Status: DC | PRN

## 2020-08-20 NOTE — Discharge Instructions (Signed)
Take medications as directed We will call you if your with recommendations if your lab results are abnormal.

## 2020-08-20 NOTE — ED Triage Notes (Signed)
Pt presents with persistent cough with no improvement, developed bruising in right lowe abdomen this morning

## 2020-08-20 NOTE — ED Provider Notes (Signed)
[facility]    CSN: [id number] Arrival date & time: 08/20/20  1423      History   Chief Complaint Chief Complaint  Patient presents with  . Cough    HPI James Serrano is a 54 y.o. male with a history of COPD was seen in the urgent care for what appears to be mild COPD exacerbation about 4 days ago.  Patient was prescribed a course of prednisone, Flonase and recommendation of continuing bronchodilator use.  Patient has since had some improvement in her shortness of breath and wheezing.  He continues to have cough which is nonproductive.  Cough is associated with anterior abdominal wall pain.  He also noticed some nontraumatic bruising on the right side of his abdomen.  No petechiae.  No joint pains, weight loss or easy bleeding/bruising.  No night sweats.   HPI  Past Medical History:  Diagnosis Date  . Bronchitis   . Tobacco use     Patient Active Problem List   Diagnosis Date Noted  . Chronic obstructive pulmonary disease ([place]) 02/17/2014  . Chest pain 02/12/2014    Past Surgical History:  Procedure Laterality Date  . ORIF ACETABULAR FRACTURE    . TONSILLECTOMY         Home Medications    Prior to Admission medications   Medication Sig Start Date End Date Taking? Authorizing Provider  albuterol (VENTOLIN HFA) 108 (90 Base) MCG/ACT inhaler 2 puffs q4 prn 03/16/20   Kathyrn Drown, MD  cetirizine (ZYRTEC ALLERGY) 10 MG tablet Take 1 tablet (10 mg total) by mouth daily. 08/10/20   Avegno, Darrelyn Hillock, FNP  chlorpheniramine-HYDROcodone (TUSSIONEX PENNKINETIC ER) 10-8 MG/5ML SUER Take 5 mLs by mouth every 12 (twelve) hours as needed for cough. 08/20/20   Chase Picket, MD  fluticasone (FLONASE) 50 MCG/ACT nasal spray Place 1 spray into both nostrils daily for 14 days. 08/10/20 08/24/20  Avegno, Darrelyn Hillock, FNP  predniSONE (DELTASONE) 10 MG tablet Take 2 tablets (20 mg total) by mouth daily. 08/10/20   Avegno, Darrelyn Hillock, FNP  SYMBICORT 80-4.5  MCG/ACT inhaler INHALE 2 PUFFS BY MOUTH TWICE DAILY - NEEDS FOLLOW UP VISIT WITHIN 60 DAYS 03/16/20   Kathyrn Drown, MD  pantoprazole (PROTONIX) 40 MG tablet Take 1 tablet (40 mg total) by mouth daily. 02/17/14 05/02/19  Mikey Kirschner, MD    Family History Family History  Problem Relation Age of Onset  . Heart attack Father   . Cancer Father   . Cancer Brother   . Cancer Mother     Social History Social History   Tobacco Use  . Smoking status: Current Every Day Smoker    Packs/day: 1.00    Years: 30.00    Pack years: 30.00    Types: Cigarettes  . Smokeless tobacco: Never Used  Substance Use Topics  . Alcohol use: Yes    Comment: occasionally  . Drug use: No     Allergies   Patient has no known allergies.   Review of Systems Review of Systems  Constitutional: Negative.   Respiratory: Positive for cough. Negative for shortness of breath and wheezing.   Cardiovascular: Positive for chest pain.  Gastrointestinal: Positive for abdominal pain. Negative for diarrhea, nausea and vomiting.  Musculoskeletal: Positive for myalgias.  Neurological: Negative for dizziness, light-headedness and headaches.     Physical Exam Triage Vital Signs ED Triage Vitals [08/20/20 1544]  Enc Vitals Group     BP 137/90  Pulse Rate (!) 103     Resp (!) 22     Temp 98.3 F (36.8 C)     Temp src      SpO2 97 %     Weight      Height      Head Circumference      Peak Flow      Pain Score      Pain Loc      Pain Edu?      Excl. in [place]?    No data found.  Updated Vital Signs BP 137/90   Pulse (!) 103   Temp 98.3 F (36.8 C)   Resp (!) 22   SpO2 97% Comment: desat to 93  Visual Acuity Right Eye Distance:   Left Eye Distance:   Bilateral Distance:    Right Eye Near:   Left Eye Near:    Bilateral Near:     Physical Exam Vitals and nursing note reviewed.  Constitutional:      General: He is not in acute distress.    Appearance: He is ill-appearing. He is not  toxic-appearing.  HENT:     Right Ear: Tympanic membrane normal.     Left Ear: Tympanic membrane normal.  Cardiovascular:     Rate and Rhythm: Normal rate and regular rhythm.     Pulses: Normal pulses.     Heart sounds: Normal heart sounds.  Pulmonary:     Effort: Pulmonary effort is normal. No respiratory distress.     Breath sounds: Normal breath sounds. No wheezing, rhonchi or rales.  Abdominal:     General: Bowel sounds are normal.     Palpations: Abdomen is soft.  Skin:    General: Skin is warm.     Findings: Bruising present.     Comments: Right flank bruising with anterior abdominal wall tenderness.  No petechiae, purpura or ecchymosis noted.  Neurological:     Mental Status: He is alert.      UC Treatments / Results  Labs (all labs ordered are listed, but only abnormal results are displayed) Labs Reviewed  CBC WITH DIFFERENTIAL/PLATELET    EKG   Radiology DG Chest 2 View  Result Date: 08/20/2020 CLINICAL DATA:  Cough for 1 month, current smoker, COPD EXAM: CHEST - 2 VIEW COMPARISON:  07/21/2014 chest radiograph. FINDINGS: Stable cardiomediastinal silhouette with normal heart size. No pneumothorax. No pleural effusion. Mildly hyperinflated lungs. No pulmonary edema. Stable small nipple shadows overlying the lower lungs bilaterally. No acute consolidative airspace disease. IMPRESSION: Mildly hyperinflated lungs, compatible with reported COPD. Otherwise no active cardiopulmonary disease. Electronically Signed   By: Ilona Sorrel M.D.   On: 08/20/2020 16:06    Procedures Procedures (including critical care time)  Medications Ordered in UC Medications - No data to display  Initial Impression / Assessment and Plan / UC Course  I have reviewed the triage vital signs and the nursing notes.  Pertinent labs & imaging results that were available during my care of the patient were reviewed by me and considered in my medical decision making (see chart for details).      1.  COPD exacerbation: Continue bronchodilator treatment There is no wheezing or worsening shortness of breath and is no indication for prescribing steroids CBC to evaluate bruising Tussionex as needed for cough Return if symptoms worsen. Final Clinical Impressions(s) / UC Diagnoses   Final diagnoses:  Chronic bronchitis, unspecified chronic bronchitis type ([place])  COPD exacerbation ([place])     Discharge  Instructions     Take medications as directed We will call you if your with recommendations if your lab results are abnormal.   ED Prescriptions    Medication Sig Dispense Auth. Provider   chlorpheniramine-HYDROcodone (TUSSIONEX PENNKINETIC ER) 10-8 MG/5ML SUER  (Status: Discontinued) Take 5 mLs by mouth every 12 (twelve) hours as needed for cough. 140 mL Laketta Soderberg, Myrene Galas, MD   chlorpheniramine-HYDROcodone (TUSSIONEX PENNKINETIC ER) 10-8 MG/5ML SUER  (Status: Discontinued) Take 5 mLs by mouth every 12 (twelve) hours as needed for cough. 140 mL Ayvah Caroll, Myrene Galas, MD   chlorpheniramine-HYDROcodone [facility] PENNKINETIC ER) 10-8 MG/5ML SUER Take 5 mLs by mouth every 12 (twelve) hours as needed for cough. 140 mL Jode Lippe, Myrene Galas, MD     I have reviewed the PDMP during this encounter.   Chase Picket, MD 08/20/20 [phone]

## 2020-08-21 LAB — CBC WITH DIFFERENTIAL/PLATELET
Basophils Absolute: 0 10*3/uL (ref 0.0–0.2)
Basos: 1 %
EOS (ABSOLUTE): 0.3 10*3/uL (ref 0.0–0.4)
Eos: 3 %
Hematocrit: 52.2 % — ABNORMAL HIGH (ref 37.5–51.0)
Hemoglobin: 17.7 g/dL (ref 13.0–17.7)
Immature Grans (Abs): 0.1 10*3/uL (ref 0.0–0.1)
Immature Granulocytes: 2 %
Lymphocytes Absolute: 1.5 10*3/uL (ref 0.7–3.1)
Lymphs: 18 %
MCH: 33 pg (ref 26.6–33.0)
MCHC: 33.9 g/dL (ref 31.5–35.7)
MCV: 97 fL (ref 79–97)
Monocytes Absolute: 0.9 10*3/uL (ref 0.1–0.9)
Monocytes: 10 %
Neutrophils Absolute: 5.7 10*3/uL (ref 1.4–7.0)
Neutrophils: 66 %
Platelets: 215 10*3/uL (ref 150–450)
RBC: 5.36 x10E6/uL (ref 4.14–5.80)
RDW: 12.3 % (ref 11.6–15.4)
WBC: 8.5 10*3/uL (ref 3.4–10.8)

## 2020-08-25 DIAGNOSIS — U071 COVID-19: Secondary | ICD-10-CM

## 2020-08-25 HISTORY — DX: COVID-19: U07.1

## 2020-09-04 ENCOUNTER — Telehealth: Payer: Self-pay

## 2020-09-04 NOTE — Telephone Encounter (Signed)
Pt has Phy on Friday needs prostate checked, but also has a cough over a month tested negative for Covid and anxiety and depression due to the death of his daughter this pass month how do you want [place] to schedule this appt for Friday leave as Phy or OV?   Pt call back [name] wife [phone]

## 2020-09-04 NOTE — Telephone Encounter (Signed)
I would recommend that the patient keep the office visit we will focus on the multiple health issues going on We can also check his prostate but we will reschedule other parts of the physical later into the spring So that way we can focus on his several issues

## 2020-09-04 NOTE — Telephone Encounter (Signed)
Pt wife contacted. Pt went to [facility] at [facility] for viral illness. No fever now. Has copd and bronchitis.  Cough bad at [facility] died of cancer Has been using inhaler Please advise. Thank you

## 2020-09-07 ENCOUNTER — Telehealth: Payer: Self-pay

## 2020-09-07 ENCOUNTER — Ambulatory Visit (INDEPENDENT_AMBULATORY_CARE_PROVIDER_SITE_OTHER): Payer: BC Managed Care – PPO | Admitting: Family Medicine

## 2020-09-07 ENCOUNTER — Other Ambulatory Visit: Payer: Self-pay | Admitting: Family Medicine

## 2020-09-07 ENCOUNTER — Encounter: Payer: Self-pay | Admitting: Family Medicine

## 2020-09-07 VITALS — BP 120/78 | Temp 97.9°F | Ht 72.0 in | Wt 253.0 lb

## 2020-09-07 DIAGNOSIS — F321 Major depressive disorder, single episode, moderate: Secondary | ICD-10-CM

## 2020-09-07 DIAGNOSIS — F4321 Adjustment disorder with depressed mood: Secondary | ICD-10-CM | POA: Diagnosis not present

## 2020-09-07 DIAGNOSIS — F419 Anxiety disorder, unspecified: Secondary | ICD-10-CM | POA: Diagnosis not present

## 2020-09-07 MED ORDER — FLUOXETINE HCL 10 MG PO CAPS: 10.0000 mg | ORAL_CAPSULE | Freq: Every day | ORAL | 2 refills | Status: DC

## 2020-09-07 MED ORDER — ALBUTEROL SULFATE HFA 108 (90 BASE) MCG/ACT IN AERS: INHALATION_SPRAY | RESPIRATORY_TRACT | 2 refills | Status: DC

## 2020-09-07 MED ORDER — CLONAZEPAM 0.5 MG PO TABS: 0.5000 mg | ORAL_TABLET | Freq: Two times a day (BID) | ORAL | 0 refills | Status: DC | PRN

## 2020-09-07 NOTE — Patient Instructions (Signed)
Managing Loss, Adult People experience loss in many different ways throughout their lives. Events such as moving, changing jobs, and losing friends can create a sense of loss. The loss may be as serious as a major health change, divorce, death of a pet, or death of a loved one. All of these types of loss are likely to create a physical and emotional reaction known as grief. Grief is the result of a major change or an absence of something or someone that you count on. Grief is a normal reaction to loss. A variety of factors can affect your grieving experience, including:  The nature of your loss.  Your relationship to what or whom you lost.  Your understanding of grief and how to manage it.  Your support system. How to manage lifestyle changes Keep to your normal routine as much as possible.  If you have trouble focusing or doing normal activities, it is acceptable to take some time away from your normal routine.  Spend time with friends and loved ones.  Eat a healthy diet, get plenty of sleep, and rest when you feel tired.   How to recognize changes  The way that you deal with your grief will affect your ability to function as you normally do. When grieving, you may experience these changes:  Numbness, shock, sadness, anxiety, anger, denial, and guilt.  Thoughts about death.  Unexpected crying.  A physical sensation of emptiness in your stomach.  Problems sleeping and eating.  Tiredness (fatigue).  Loss of interest in normal activities.  Dreaming about or imagining seeing the person who died.  A need to remember what or whom you lost.  Difficulty thinking about anything other than your loss for a period of time.  Relief. If you have been expecting the loss for a while, you may feel a sense of relief when it happens. Follow these instructions at home: Activity Express your feelings in healthy ways, such as:  Talking with others about your loss. It may be helpful to find  others who have had a similar loss, such as a support group.  Writing down your feelings in a journal.  Doing physical activities to release stress and emotional energy.  Doing creative activities like painting, sculpting, or playing or listening to music.  Practicing resilience. This is the ability to recover and adjust after facing challenges. Reading some resources that encourage resilience may help you to learn ways to practice those behaviors.   General instructions  Be patient with yourself and others. Allow the grieving process to happen, and remember that grieving takes time. ? It is likely that you may never feel completely done with some grief. You may find a way to move on while still cherishing memories and feelings about your loss. ? Accepting your loss is a process. It can take months or longer to adjust.  Keep all follow-up visits as told by your health care provider. This is important. Where to find support To get support for managing loss:  Ask your health care provider for help and recommendations, such as grief counseling or therapy.  Think about joining a support group for people who are managing a loss. Where to find more information You can find more information about managing loss from:  American Society of Clinical Oncology: www.cancer.net  American Psychological Association: www.apa.org Contact a health care provider if:  Your grief is extreme and keeps getting worse.  You have ongoing grief that does not improve.  Your body shows symptoms   of grief, such as illness.  You feel depressed, anxious, or lonely. Get help right away if:  You have thoughts about hurting yourself or others. If you ever feel like you may hurt yourself or others, or have thoughts about taking your own life, get help right away. You can go to your nearest emergency department or call:  Your local emergency services (911 in the [place]  A suicide crisis helpline, such as the  National Suicide Prevention Lifeline at 1-800-273-8255. This is open 24 hours a day. Summary  Grief is the result of a major change or an absence of someone or something that you count on. Grief is a normal reaction to loss.  The depth of grief and the period of recovery depend on the type of loss and your ability to adjust to the change and process your feelings.  Processing grief requires patience and a willingness to accept your feelings and talk about your loss with people who are supportive.  It is important to find resources that work for you and to realize that people experience grief differently. There is not one grieving process that works for everyone in the same way.  Be aware that when grief becomes extreme, it can lead to more severe issues like isolation, depression, anxiety, or suicidal thoughts. Talk with your health care provider if you have any of these issues. This information is not intended to replace advice given to you by your health care provider. Make sure you discuss any questions you have with your health care provider. Document Revised: 02/02/2020 Document Reviewed: 02/02/2020 Elsevier Patient Education  2021 Elsevier Inc.  

## 2020-09-07 NOTE — Telephone Encounter (Signed)
James Serrano forgot to tell Dr Nicki Reaper that he needs a refill on albuterol (VENTOLIN HFA) 108 (90 Base) MCG/ACT inhaler sent to [place], [place] - [place]  [address]   Pt call back [phone]

## 2020-09-07 NOTE — Telephone Encounter (Signed)
Please advise. Thank you

## 2020-09-07 NOTE — Telephone Encounter (Signed)
3 refills 

## 2020-09-07 NOTE — Telephone Encounter (Signed)
Rx sent 

## 2020-09-07 NOTE — Progress Notes (Addendum)
Subjective:    Patient ID: James Serrano, male    DOB: Jan 03, 1966, 55 y.o.   MRN: [id number]  HPI  Patient arrives to discuss worsening depression and anxiety since recent death of daughter. Very difficult situation He lost his daughter suddenly to the possibility of infection versus sleep apnea He finds himself feeling anxious nervous sad blue on edge difficult time getting his mind off of it difficult time sleeping.  Denies any substance abuse.  Denies being suicidal.  Finds himself feeling very distraught.  Does not want to do any counseling he states this is just not something that appeals to him he does work out of town Monday through Thursday and his wife told him he needed to get some help so therefore he came today to be evaluated  Depression, major, single episode, moderate ([place])  Grief  Anxiousness Patient also due for physical but we will delay this from today's visit  Review of Systems  Constitutional: Negative for activity change.  HENT: Negative for congestion and rhinorrhea.   Respiratory: Negative for cough and shortness of breath.   Cardiovascular: Negative for chest pain.  Gastrointestinal: Negative for abdominal pain, diarrhea, nausea and vomiting.  Genitourinary: Negative for dysuria and hematuria.  Neurological: Negative for weakness and headaches.  Psychiatric/Behavioral: Negative for behavioral problems and confusion.   Depression screen PHQ 2/9 03/16/2020  Decreased Interest 0  Down, Depressed, Hopeless 0  PHQ - 2 Score 0        Objective:   Physical Exam Vitals reviewed.  Constitutional:      General: He is not in acute distress.    Appearance: He is well-nourished.  HENT:     Head: Normocephalic and atraumatic.  Eyes:     General:        Right eye: No discharge.        Left eye: No discharge.  Neck:     Trachea: No tracheal deviation.  Cardiovascular:     Rate and Rhythm: Normal rate and regular rhythm.     Heart sounds: Normal  heart sounds. No murmur heard.   Pulmonary:     Effort: Pulmonary effort is normal. No respiratory distress.     Breath sounds: Normal breath sounds.  Musculoskeletal:        General: No edema.  Lymphadenopathy:     Cervical: No cervical adenopathy.  Skin:    General: Skin is warm and dry.  Neurological:     Mental Status: He is alert.     Coordination: Coordination normal.  Psychiatric:        Mood and Affect: Mood and affect normal.        Behavior: Behavior normal.     Time was spent with the patient discussing how medication works also discussing use of Klonopin when necessary and when to safely use it and when not to use it and that all of these medicines are not designed for long-term use and do need follow-up close.  Should he deteriorate in condition or become suicidal seek help immediately ER or behavioral health urgent care otherwise follow-up with [place] in 3 weeks and then a wellness in the spring 30 minutes with patient      Assessment & Plan:  Moderate depression Prozac 10 mg daily Follow-up phone visit in 3 weeks follow-up in person in 7 to 8 weeks  Anxiety related issues Klonopin when necessary home use only not when driving or working Patient defers on counseling  Will follow-up for  wellness  Not suicidal.

## 2020-09-12 NOTE — Telephone Encounter (Signed)
Completed.

## 2020-09-13 ENCOUNTER — Telehealth: Payer: BC Managed Care – PPO | Admitting: Family

## 2020-09-13 DIAGNOSIS — R059 Cough, unspecified: Secondary | ICD-10-CM | POA: Diagnosis not present

## 2020-09-13 MED ORDER — BENZONATATE 100 MG PO CAPS: 100.0000 mg | ORAL_CAPSULE | Freq: Three times a day (TID) | ORAL | 0 refills | Status: DC | PRN

## 2020-09-13 NOTE — Progress Notes (Signed)
We are sorry that you are not feeling well.  Here is how we plan to help!  Based on your presentation I believe you most likely have A cough due to a virus.  This is called viral bronchitis and is best treated by rest, plenty of fluids and control of the cough.  You may use Ibuprofen or Tylenol as directed to help your symptoms.     In addition you may use A prescription cough medication called Tessalon Perles 100mg . You may take 1-2 capsules every 8 hours as needed for your cough.  PLEASE GET TESTED FOR COVID- your symptoms are concerning for a COVID 19 infection.   From your responses in the eVisit questionnaire you describe inflammation in the upper respiratory tract which is causing a significant cough.  This is commonly called Bronchitis and has four common causes:    Allergies  Viral Infections  Acid Reflux  Bacterial Infection Allergies, viruses and acid reflux are treated by controlling symptoms or eliminating the cause. An example might be a cough caused by taking certain blood pressure medications. You stop the cough by changing the medication. Another example might be a cough caused by acid reflux. Controlling the reflux helps control the cough.  USE OF BRONCHODILATOR ("RESCUE") INHALERS: There is a risk from using your bronchodilator too frequently.  The risk is that over-reliance on a medication which only relaxes the muscles surrounding the breathing tubes can reduce the effectiveness of medications prescribed to reduce swelling and congestion of the tubes themselves.  Although you feel brief relief from the bronchodilator inhaler, your asthma may actually be worsening with the tubes becoming more swollen and filled with mucus.  This can delay other crucial treatments, such as oral steroid medications. If you need to use a bronchodilator inhaler daily, several times per day, you should discuss this with your provider.  There are probably better treatments that could be used to keep  your asthma under control.     HOME CARE . Only take medications as instructed by your medical team. . Complete the entire course of an antibiotic. . Drink plenty of fluids and get plenty of rest. . Avoid close contacts especially the very young and the elderly . Cover your mouth if you cough or cough into your sleeve. . Always remember to wash your hands . A steam or ultrasonic humidifier can help congestion.   GET HELP RIGHT AWAY IF: . You develop worsening fever. . You become short of breath . You cough up blood. . Your symptoms persist after you have completed your treatment plan MAKE SURE YOU   Understand these instructions.  Will watch your condition.  Will get help right away if you are not doing well or get worse.  Your e-visit answers were reviewed by a board certified advanced clinical practitioner to complete your personal care plan.  Depending on the condition, your plan could have included both over the counter or prescription medications. If there is a problem please reply  once you have received a response from your provider. Your safety is important to [place].  If you have drug allergies check your prescription carefully.    You can use MyChart to ask questions about today's visit, request a non-urgent call back, or ask for a work or school excuse for 24 hours related to this e-Visit. If it has been greater than 24 hours you will need to follow up with your provider, or enter a new e-Visit to address those concerns. You  will get an e-mail in the next two days asking about your experience.  I hope that your e-visit has been valuable and will speed your recovery. Thank you for using e-visits.  Greater than 5 minutes, yet less than 10 minutes of time have been spent researching, coordinating, and implementing care for this patient.

## 2020-09-21 ENCOUNTER — Ambulatory Visit: Payer: BC Managed Care – PPO | Admitting: Nurse Practitioner

## 2020-09-27 ENCOUNTER — Ambulatory Visit: Payer: BC Managed Care – PPO | Admitting: Family Medicine

## 2020-10-04 ENCOUNTER — Other Ambulatory Visit: Payer: Self-pay | Admitting: Family Medicine

## 2020-10-26 ENCOUNTER — Encounter: Payer: BC Managed Care – PPO | Admitting: Family Medicine

## 2020-11-16 ENCOUNTER — Encounter: Payer: Self-pay | Admitting: Family Medicine

## 2020-11-16 ENCOUNTER — Other Ambulatory Visit: Payer: Self-pay

## 2020-11-16 ENCOUNTER — Ambulatory Visit (INDEPENDENT_AMBULATORY_CARE_PROVIDER_SITE_OTHER): Payer: BC Managed Care – PPO | Admitting: Family Medicine

## 2020-11-16 VITALS — BP 132/80 | HR 86 | Temp 97.3°F | Ht 72.0 in | Wt 251.0 lb

## 2020-11-16 DIAGNOSIS — Z79899 Other long term (current) drug therapy: Secondary | ICD-10-CM

## 2020-11-16 DIAGNOSIS — Z1211 Encounter for screening for malignant neoplasm of colon: Secondary | ICD-10-CM | POA: Diagnosis not present

## 2020-11-16 DIAGNOSIS — Z125 Encounter for screening for malignant neoplasm of prostate: Secondary | ICD-10-CM

## 2020-11-16 DIAGNOSIS — Z0000 Encounter for general adult medical examination without abnormal findings: Secondary | ICD-10-CM

## 2020-11-16 DIAGNOSIS — E781 Pure hyperglyceridemia: Secondary | ICD-10-CM

## 2020-11-16 DIAGNOSIS — D692 Other nonthrombocytopenic purpura: Secondary | ICD-10-CM

## 2020-11-16 MED ORDER — PANTOPRAZOLE SODIUM 40 MG PO TBEC: 40.0000 mg | DELAYED_RELEASE_TABLET | Freq: Every day | ORAL | 5 refills | Status: DC

## 2020-11-16 MED ORDER — ALBUTEROL SULFATE HFA 108 (90 BASE) MCG/ACT IN AERS: INHALATION_SPRAY | RESPIRATORY_TRACT | 5 refills | Status: DC

## 2020-11-16 MED ORDER — SYMBICORT 80-4.5 MCG/ACT IN AERO: INHALATION_SPRAY | RESPIRATORY_TRACT | 5 refills | Status: DC

## 2020-11-16 MED ORDER — BUDESONIDE-FORMOTEROL FUMARATE 160-4.5 MCG/ACT IN AERO: INHALATION_SPRAY | RESPIRATORY_TRACT | 5 refills | Status: DC

## 2020-11-16 NOTE — Patient Instructions (Addendum)
It was good to see you today  I hope the racing season goes well  please do your stool test for blood as part of your colon cancer screening When you are ready to have a colonoscopy please let us know we will help set this up Please do your blood work in June Follow-up again in 6 months Please work hard on quitting smoking Please read over this information as well [facility]-Dr. Nicki Reaper   https://www.cancer.gov/types/colorectal/patient/colorectal-prevention-pdq#section/_[email or web address]  Preventing Colorectal Cancer Colorectal cancer is a cancerous (malignant) tumor in the colon or rectum, which are parts of the large intestine. A tumor is a mass of cells or tissue. If this cancer is not found early, it can spread to other parts of the body and can be life-threatening. It is one of the most commonly diagnosed types of cancer and a leading cause of death related to cancer. Colorectal cancer cannot always be prevented, but you can take steps to lower your risk of developing it. How can this condition affect me? Most often, colorectal cancer starts as abnormal growths called polyps on the inner wall of the colon or rectum. Left untreated, these polyps can develop into cancer. You can help prevent colorectal cancer by having screening tests to check for these early signs of cancer. During screening, polyps may be removed to be checked for cancer cells and possibly to prevent them from becoming cancerous. If you delay recommended screenings, it is possible that any existing cancer will grow and spread before it is found. Along with regular screenings, making changes to your diet and lifestyle can help prevent colorectal cancer. What can increase my risk? Certain factors may make you more likely to develop this condition. Risk factors that can be changed  Being inactive (sedentary).  Being overweight or obese.  Having a diet that: ? Is high in red meat, precooked or cured meat, or other processed meat, and high  in fat (especially animal fat). ? Is low in sources of fiber, such as fruits, vegetables, and whole grains. ? Includes large amounts of sugar-sweetened beverages.  Drinking too much alcohol.  Smoking. Risk factors that cannot be changed  Being older than age 17.  Having a personal or family history of colorectal cancer or polyps in your colon.  Having a type of genetic syndrome that is passed from parent to child (hereditary), such as: ? Lynch syndrome. ? Familial adenomatous polyposis. ? Turcot syndrome. ? Peutz-Jeghers syndrome. ? MUTYH-associated polyposis (MAP).  Having certain other conditions, such as: ? Inflammatory bowel disease. This includes ulcerative colitis or Crohn's disease. ? Diabetes. ? A history of cancer that was treated with radiation to the abdomen or the area between the hip bones (pelvic area). What actions can I take to prevent this? Nutrition  Eat plenty of fruits and vegetables. Try to eat at least: ? 1-2 cups of fruit each day. ? 2-3 cups of vegetables each day.  Eat whole grains, which are grains that have not been processed. They include oats, whole wheat, bulgur, brown rice, quinoa, and millet. You should eat 6-8 oz (171-227 g) of grains each day. Use a kitchen scale to measure these amounts.  Eat less red meat. Instead, choose lean sources of protein, such as beans, tofu, fish, and chicken.  Avoid precooked or cured meat, such as sausages or meat loaves. Avoid frying and cooking meat at high heat. Use other methods of cooking, such as steaming or sauting.   Lifestyle  Do not use any products that contain  nicotine or tobacco, such as cigarettes, e-cigarettes, and chewing tobacco. If you need help quitting, ask your health care provider.  If you drink alcohol: ? Limit how much you use to:  0-1 drink a day for women who are not pregnant.  0-2 drinks a day for men. ? Be aware of how much alcohol is in your drink. In the [place] one drink  equals one 12 oz bottle of beer (355 mL), one 5 oz glass of wine (148 mL), or one 1 oz glass of hard liquor (44 mL).  If you are overweight or obese, work with your health care provider to lose weight.  Exercise. Each week, aim for at least 150 minutes of moderate exercise (such as walking, biking, or yoga) or 75 minutes of vigorous exercise (such as running or swimming). Ask your health care provider how much physical activity is best for you. Getting screened Have regular screenings as often as recommended by your health care provider.  All adults should have screenings starting at age 16 and continuing until age 55. Your health care provider may recommend screening before age 33. You will have tests every 1-10 years, depending on your results and the type of screening test. People at increased risk should start screening at an earlier age.  There are several types of screening tests, such as: ? Tests to check a stool (feces) sample for blood or for DNA changes that could lead to cancer. ? Sigmoidoscopy. During this test, a flexible tube with a tiny camera (sigmoidoscope) is inserted through the opening between the buttocks (anus) and is used to examine the rectum and lower colon. ? Colonoscopy. During this test, a long, thin, flexible tube with a tiny camera (colonoscope) is used to examine the entire colon and rectum. ? Virtual colonoscopy. Instead of a colonoscope, this type of colonoscopy uses a CT scan and computers to take pictures of the colon and rectum. General information  Ask your health care provider if you should take low-dose aspirin to help prevent polyps.  Find out about your family's medical history. It is important to know whether colorectal cancer is in your family. If you have a family history of colorectal cancer, ask to meet with a genetic counselor. Where to find support  Talk with your health care provider about screenings, a healthy diet, and proper exercise.  If  you need help to quit smoking or using tobacco, talk to your health care provider or visit these websites: ? VirusCrisis.dk: smokefree.gov ? [place].S. Department of Health and Human Services: http://gray-george.biz/ Where to find more information  American Cancer Society: cancer.[place]: cancer.gov Contact a health care provider if you have:  Blood in your stool or in the toilet after a bowel movement.  Discomfort, pain, bloating, fullness, or cramps in your abdomen.  A change in your bowel habits.  Unexplained weight loss.  Nausea and vomiting.  Constant tiredness (fatigue). Summary  You can reduce your chances of getting colorectal cancer by getting recommended screenings and making certain diet and lifestyle changes.  Eat plenty of fruits, vegetables, and whole grains. Avoid red meats and processed meats.  Do not use any products that contain nicotine or tobacco, such as cigarettes, e-cigarettes, and chewing tobacco.  Aim for at least 150 minutes of moderate exercise or 75 minutes of vigorous exercise each week. Ask your health care provider how much activity is best for you.  Get regular screenings as often as recommended by your health care provider.  Most people should start having colonoscopies at age 11. This information is not intended to replace advice given to you by your health care provider. Make sure you discuss any questions you have with your health care provider. Document Revised: 11/30/2019 Document Reviewed: 11/30/2019 Elsevier Patient Education  2021 [place]. Nicotine skin patches What is this medicine? NICOTINE ([place] teen) helps people stop smoking. The patches replace the nicotine found in cigarettes and help to decrease withdrawal effects. They are most effective when used in combination with a stop-smoking program. This medicine may be used for other purposes; ask your health care provider or pharmacist if you have questions. COMMON  BRAND NAME(S): Habitrol, Nicoderm CQ, Nicotrol What should I tell my health care provider before I take this medicine? They need to know if you have any of these conditions:  diabetes  heart disease, angina, irregular heartbeat or previous heart attack  high blood pressure  lung disease, including asthma  overactive thyroid  pheochromocytoma  seizures or a history of seizures  skin problems, like eczema  stomach problems or ulcers  an unusual or allergic reaction to nicotine, adhesives, other medicines, foods, dyes, or preservatives  pregnant or trying to get pregnant  breast-feeding How should I use this medicine? This medicine is for external use only. Follow the directions on the label. Do not cut or trim the patch. After applying the patch, wash your hands. Change the patch every day in the morning, keeping to a regular schedule. When you apply a new patch, use a new area of skin. Wait at least 1 week before using the same area again. This drug comes with INSTRUCTIONS FOR USE. Ask your pharmacist for directions on how to use this drug. Read the information carefully. Talk to your pharmacist or health care provider if you have questions. Talk to your pediatrician regarding the use of this medicine in children. Special care may be needed. Overdosage: If you think you have taken too much of this medicine contact a poison control center or emergency room at once. NOTE: This medicine is only for you. Do not share this medicine with others. What if I miss a dose? If you forget to replace a patch, use it as soon as you can. Only use one patch at a time and do not leave on the skin for longer than directed. If a patch falls off, you can replace it, but keep to your schedule and remove the patch at the right time. What may interact with this medicine?  certain medicines for lung or breathing disease, like asthma  medicines for blood pressure  medicines for depression This list  may not describe all possible interactions. Give your health care provider a list of all the medicines, herbs, non-prescription drugs, or dietary supplements you use. Also tell them if you smoke, drink alcohol, or use illegal drugs. Some items may interact with your medicine. What should I watch for while using this medicine? Visit your health care provider for regular checks on your progress. Talk to your health care provider about what you can do to improve your chances of quitting. If you are going to need surgery, an MRI, CT scan, or other procedure, tell your health care provider that you are using this drug. You may need to remove the patch before the procedure. What side effects may I notice from receiving this medicine? Side effects that you should report to your doctor or health care professional as soon as possible:  allergic reactions  like skin rash, itching or hives, swelling of the face, lips, or tongue  breathing problems  changes in hearing  changes in vision  chest pain  cold sweats  confusion  fast, irregular heartbeat  feeling faint or lightheaded, falls  headache  increased saliva  skin redness that lasts more than 4 days  stomach pain  signs and symptoms of nicotine overdose like nausea; vomiting; dizziness; weakness; and rapid heartbeat Side effects that usually do not require medical attention (report to your doctor or health care professional if they continue or are bothersome):  diarrhea  dry mouth  hiccups  irritability  nervousness or restlessness  trouble sleeping or vivid dreams This list may not describe all possible side effects. Call your doctor for medical advice about side effects. You may report side effects to FDA at 1-800-FDA-1088. Where should I keep my medicine? This product may have enough nicotine in it to make children and pets sick. Keep it away from children and pets. After using, throw away as directed on the package. Store  at room temperature between 20 and 25 degrees C (68 and 77 degrees F). Protect from heat and light. Keep this medicine in the original container until ready to use. Throw away unused medicine after the expiration date. NOTE: This sheet is a summary. It may not cover all possible information. If you have questions about this medicine, talk to your doctor, pharmacist, or health care provider.  2021 Elsevier/Gold Standard (2019-07-28 16:54:39) Steps to Quit Smoking Smoking tobacco is the leading cause of preventable death. It can affect almost every organ in the body. Smoking puts you and people around you at risk for many serious, long-lasting (chronic) diseases. Quitting smoking can be hard, but it is one of the best things that you can do for your health. It is never too late to quit. How do I get ready to quit? When you decide to quit smoking, make a plan to help you succeed. Before you quit:  Pick a date to quit. Set a date within the next 2 weeks to give you time to prepare.  Write down the reasons why you are quitting. Keep this list in places where you will see it often.  Tell your family, friends, and co-workers that you are quitting. Their support is important.  Talk with your doctor about the choices that may help you quit.  Find out if your health insurance will pay for these treatments.  Know the people, places, things, and activities that make you want to smoke (triggers). Avoid them. What first steps can I take to quit smoking?  Throw away all cigarettes at home, at work, and in your car.  Throw away the things that you use when you smoke, such as ashtrays and lighters.  Clean your car. Make sure to empty the ashtray.  Clean your home, including curtains and carpets. What can I do to help me quit smoking? Talk with your doctor about taking medicines and seeing a counselor at the same time. You are more likely to succeed when you do both.  If you are pregnant or  breastfeeding, talk with your doctor about counseling or other ways to quit smoking. Do not take medicine to help you quit smoking unless your doctor tells you to do so. To quit smoking: Quit right away  Quit smoking totally, instead of slowly cutting back on how much you smoke over a period of time.  Go to counseling. You are more likely to quit  if you go to counseling sessions regularly. Take medicine You may take medicines to help you quit. Some medicines need a prescription, and some you can buy over-the-counter. Some medicines may contain a drug called nicotine to replace the nicotine in cigarettes. Medicines may:  Help you to stop having the desire to smoke (cravings).  Help to stop the problems that come when you stop smoking (withdrawal symptoms). Your doctor may ask you to use:  Nicotine patches, gum, or lozenges.  Nicotine inhalers or sprays.  Non-nicotine medicine that is taken by mouth. Find resources Find resources and other ways to help you quit smoking and remain smoke-free after you quit. These resources are most helpful when you use them often. They include:  Online chats with a Social worker.  Phone quitlines.  Printed Furniture conservator/restorer.  Support groups or group counseling.  Text messaging programs.  Mobile phone apps. Use apps on your mobile phone or tablet that can help you stick to your quit plan. There are many free apps for mobile phones and tablets as well as websites. Examples include Quit Guide from the [employer] and smokefree.gov   What things can I do to make it easier to quit?  Talk to your family and friends. Ask them to support and encourage you.  Call a phone quitline (1-800-QUIT-NOW), reach out to support groups, or work with a Social worker.  Ask people who smoke to not smoke around you.  Avoid places that make you want to smoke, such as: ? Bars. ? Parties. ? Smoke-break areas at work.  Spend time with people who do not smoke.  Lower the stress  in your life. Stress can make you want to smoke. Try these things to help your stress: ? Getting regular exercise. ? Doing deep-breathing exercises. ? Doing yoga. ? Meditating. ? Doing a body scan. To do this, close your eyes, focus on one area of your body at a time from head to toe. Notice which parts of your body are tense. Try to relax the muscles in those areas.   How will I feel when I quit smoking? Day 1 to 3 weeks Within the first 24 hours, you may start to have some problems that come from quitting tobacco. These problems are very bad 2-3 days after you quit, but they do not often last for more than 2-3 weeks. You may get these symptoms:  Mood swings.  Feeling restless, nervous, angry, or annoyed.  Trouble concentrating.  Dizziness.  Strong desire for high-sugar foods and nicotine.  Weight gain.  Trouble pooping (constipation).  Feeling like you may vomit (nausea).  Coughing or a sore throat.  Changes in how the medicines that you take for other issues work in your body.  Depression.  Trouble sleeping (insomnia). Week 3 and afterward After the first 2-3 weeks of quitting, you may start to notice more positive results, such as:  Better sense of smell and taste.  Less coughing and sore throat.  Slower heart rate.  Lower blood pressure.  Clearer skin.  Better breathing.  Fewer sick days. Quitting smoking can be hard. Do not give up if you fail the first time. Some people need to try a few times before they succeed. Do your best to stick to your quit plan, and talk with your doctor if you have any questions or concerns. Summary  Smoking tobacco is the leading cause of preventable death. Quitting smoking can be hard, but it is one of the best things that you can do  for your health.  When you decide to quit smoking, make a plan to help you succeed.  Quit smoking right away, not slowly over a period of time.  When you start quitting, seek help from your  doctor, family, or friends. This information is not intended to replace advice given to you by your health care provider. Make sure you discuss any questions you have with your health care provider. Document Revised: 05/06/2019 Document Reviewed: 10/30/2018 Elsevier Patient Education  [place].

## 2020-11-16 NOTE — Progress Notes (Signed)
   Subjective:    Patient ID: James Serrano, male    DOB: 1966/05/06, 55 y.o.   MRN: [id number]  HPI The patient comes in today for a wellness visit. Very nice patient States overall things been going well lately Works out of town Also does a lot of racing  Does smoke he knows he needs to quit we have encouraged him for the past unfortunately he has not followed through with quitting   A review of their health history was completed.  A review of medications was also completed.  Any needed refills; ventolin  Eating habits: not health conscious  Falls/  MVA accidents in past few months: none  Regular exercise: none besides work  Sales promotion account executive pt sees on regular basis: none  Preventative health issues were discussed.   Additional concerns: none    Review of Systems  Constitutional: Negative for activity change.  HENT: Negative for congestion and rhinorrhea.   Respiratory: Negative for cough and shortness of breath.   Cardiovascular: Negative for chest pain.  Gastrointestinal: Negative for abdominal pain, diarrhea, nausea and vomiting.  Genitourinary: Negative for dysuria and hematuria.  Neurological: Negative for weakness and headaches.  Psychiatric/Behavioral: Negative for behavioral problems and confusion.       Objective:   Physical Exam  Lungs clear heart regular HEENT benign prostate exam normal skin normal patient does have senile purpura noted      Assessment & Plan:  1. Routine general medical examination at a health care facility Adult wellness-complete.wellness physical was conducted today. Importance of diet and exercise were discussed in detail.  In addition to this a discussion regarding safety was also covered. We also reviewed over immunizations and gave recommendations regarding current immunization needed for age.  In addition to this additional areas were also touched on including: Preventative health exams needed:  Colonoscopy patient  strongly encouraged to do colonoscopy he will do stool test for blood Patient was encouraged to quit smoking nicotine patches discussed with him  Patient was advised yearly wellness exam  - Lipid Profile - Comprehensive Metabolic Panel (CMET) - PSA  2. Screening PSA (prostate specific antigen) Screening PSA - Lipid Profile - Comprehensive Metabolic Panel (CMET) - PSA  3. High risk medication use Check liver functions - Lipid Profile - Comprehensive Metabolic Panel (CMET) - PSA  4. Screening for colon cancer Stool test for blood I did encourage him to get a colonoscopy he will think about it - Lipid Profile - Comprehensive Metabolic Panel (CMET) - PSA - IFOBT POC (occult bld, rslt in office); Future  5. Hypertriglyceridemia Cholesterol profile - Lipid Profile - Comprehensive Metabolic Panel (CMET) - PSA  6. Senile purpura (HCC) Bruising on the skin consistent with senile purpura Patient with COPD Needs to quit smoking Increase his strength of the Symbicort Follow-up if progressive problems Recheck within 6 months

## 2021-01-25 ENCOUNTER — Institutional Professional Consult (permissible substitution): Payer: BC Managed Care – PPO | Admitting: Internal Medicine

## 2021-02-19 ENCOUNTER — Other Ambulatory Visit: Payer: Self-pay | Admitting: Physician Assistant

## 2021-02-19 ENCOUNTER — Telehealth: Payer: BC Managed Care – PPO | Admitting: Physician Assistant

## 2021-02-19 DIAGNOSIS — F17200 Nicotine dependence, unspecified, uncomplicated: Secondary | ICD-10-CM

## 2021-02-19 DIAGNOSIS — Z716 Tobacco abuse counseling: Secondary | ICD-10-CM

## 2021-02-19 MED ORDER — VARENICLINE TARTRATE 0.5 MG PO TABS: ORAL_TABLET | ORAL | 0 refills | Status: DC

## 2021-02-19 MED ORDER — VARENICLINE TARTRATE 0.5 MG X 11 & 1 MG X 42 PO MISC: ORAL | 0 refills | Status: DC

## 2021-02-19 NOTE — Progress Notes (Signed)
E-Visit for Smoking Cessation  Congratulations for your interest in quitting smoking!  Quitting smoking is one of the most important things you can do to protect your health.  We are here to help you! Did you know that if you quit smoking 1 pack per day you could save up to $2550.00 per year?  Medications are not appropriate for everyone depending upon your situation and any health issues you may have. If we do prescribe medication, it will be for 1 month at time and you will be required to have a follow up E-Visit at 1 month to assess how you are doing and any side effects.  This could be up to a total of 3 months.    Support of your friends, family and work colleagues is very important. Please let them know that you are trying to stop smoking so that they understand your need for support in this goal.  Remove tobacco products from your environment  Set a quit date ideally within 2 weeks.  You should totally abstain from smoking after your quit date. A single puff could hurt your progress or cause you to relapse  If there are others in your household that smoke, ask them to try to quit or abstain from smoking in your presence  You may notice nicotine withdrawal symptoms such as increased appetite and weight gain, changes in mood, insomnia, irritability and/or anxiety. These symptoms peak in the first three days after smoking cessation and subside over the next 3-4 weeks.   I have prescribed Chantix (Varenicline) .5 mg tablets. Start 1 week before your quit date. Take after eating with a full glass of water. take one tablet by mouth daily for 3 days. On day 4, begin taking one tablet twice a day for 4 days. Then start taking 2 tablets twice daily for the rest of the 12 week course.  You may use over the counter Nicotine gum. Chew one piece of gum every one to two hours while awake and when there is an urge to smoke. Use up to 24 pieces of gum per day for six weeks. Then gradually reduce use over a  second six weeks. Then gradually reduce use over a second six weeks. Chew the gum slowly - using the "chew" and "park" method. Chew the gum until the nicotine taste appears, then "park" the gum until the taste disappears, then chew again to release more nicotine. Chew the gum for 30 minutes.  A combination of behavioral and medication can improve the success of you quitting smoking. You may want to use the 1-800-QUIT-NOW free support line.  Also, the Department of Health and Human Services provides Smoke free apps for smartphones: https://www.white.net/  If you happen to break your plan and have a cigarette, keep taking your medications and continue to try to abstain.  Do not give up!  MAKE SURE YOU  Take any prescribed medications only as instructed.  If you miss a dose of medication, take the next dose when it is due and get back on schedule. DO NOT double up on medications.  Mark your calendar to do your Follow Up Smoking Cessation E-Visit in one month    Thank you for choosing an e-visit.  Your e-visit answers were reviewed by a board certified advanced clinical practitioner to complete your personal care plan. Depending upon the condition, your plan could have included both over the counter or prescription medications.  Please review your pharmacy choice. Make sure the pharmacy is open  so you can pick up prescription now. If there is a problem, you may contact your provider through [employer] and have the prescription routed to another pharmacy.  Your safety is important to [place]. If you have drug allergies check your prescription carefully.   For the next 24 hours you can use MyChart to ask questions about today's visit, request a non-urgent call back, or ask for a work or school excuse. You will get an email in the next two days asking about your experience. I hope that your e-visit has been valuable and will speed your recovery.  I provided 5 minutes of non  face-to-face time during this encounter for chart review and documentation.

## 2021-02-21 MED ORDER — VARENICLINE TARTRATE 0.5 MG PO TABS: ORAL_TABLET | ORAL | 0 refills | Status: DC

## 2021-02-21 NOTE — Addendum Note (Signed)
Addended by: Mar Daring on: 02/21/2021 01:39 PM   Modules accepted: Orders

## 2021-02-22 ENCOUNTER — Institutional Professional Consult (permissible substitution): Payer: BC Managed Care – PPO | Admitting: Internal Medicine

## 2021-03-08 ENCOUNTER — Institutional Professional Consult (permissible substitution): Payer: BC Managed Care – PPO | Admitting: Internal Medicine

## 2021-03-25 ENCOUNTER — Other Ambulatory Visit: Payer: Self-pay | Admitting: Family Medicine

## 2021-03-31 ENCOUNTER — Telehealth: Payer: Self-pay | Admitting: Physician Assistant

## 2021-03-31 DIAGNOSIS — Z716 Tobacco abuse counseling: Secondary | ICD-10-CM

## 2021-03-31 MED ORDER — BUPROPION HCL ER (SR) 150 MG PO TB12: 150.0000 mg | ORAL_TABLET | Freq: Two times a day (BID) | ORAL | 0 refills | Status: DC

## 2021-03-31 NOTE — Progress Notes (Signed)
E-Visit Follow up for Smoking Cessation  Congratulations for your hard work to quit smoking these past 4 weeks!  Quitting smoking is one of the most important things you can do to protect your health.  1 in every 5 deaths can be attributed to cigarette smoking. Smokers who quit smoking reduce their risk of developing and dying from tobacco-related disease.  We are here to help you!   Support of your friends, family and work colleagues is very important. Please continue to let them know that you are trying to stop smoking so that they understand your need for support in this goal.   Remove tobacco products from your environment  You should totally abstain from smoking after your quit date. A single puff could hurt your progress or cause you to relapse  If there are others in your household that smoke, ask them to try to quit or abstain from smoking in your presence  You may notice nicotine withdrawal symptoms such as increased appetite and weight gain, changes in mood, insomnia, irritability and/or anxiety. These symptoms peak in the first three days after smoking cessation and subside over the next 3-4 weeks.   Medications are not appropriate for everyone depending upon your situation and any health issues you may have. If we do prescribe medication, it will be for 1 month at time and you will be required to have a follow up E-Visit at 1 month to assess how you are doing and any side effects.  This could be up to a total of 3 months.    Giving your intolerance of Chantix and prior ineffective use of patches, gum, etc:  I have prescribed Bupropion SR 150 mg. Start one week before your quit date. This can be taken with or without food. Take one tablet daily by mouth for 3 days, then increase to 1 tablet twice daily.  Continue for at least 12 weeks  A combination of behavioral and medication can improve the success of you quitting smoking. You may want to use the 1-800-QUIT-NOW free support line.   Also, the Department of Health and Human Services provides Smoke free apps for smartphones: https://www.white.net/ If you happen to break your plan and have a cigarette, keep taking your medications and continue to try to abstain.  Do not give up!  MAKE SURE YOU  Take any prescribed medications only as instructed.  If you miss a dose of medication, take the next dose when it is due and get back on schedule. DO NOT double up on medications.  Mark your calendar to do your Follow Up Smoking Cessation E-Visit in one month    Thank you for choosing an e-visit.  Your e-visit answers were reviewed by a board certified advanced clinical practitioner to complete your personal care plan. Depending upon the condition, your plan could have included both over the counter or prescription medications.  Please review your pharmacy choice. Make sure the pharmacy is open so you can pick up prescription now. If there is a problem, you may contact your provider through [employer] and have the prescription routed to another pharmacy.  Your safety is important to [place]. If you have drug allergies check your prescription carefully.   For the next 24 hours you can use MyChart to ask questions about today's visit, request a non-urgent call back, or ask for a work or school excuse. You will get an email in the next two days asking about your experience. I hope that your e-visit has  been valuable and will speed your recovery.

## 2021-03-31 NOTE — Progress Notes (Signed)
I have spent 5 minutes in review of e-visit questionnaire, review and updating patient chart, medical decision making and response to patient.   Rochel Privett Cody Wildon Cuevas, PA-C    

## 2021-04-05 ENCOUNTER — Institutional Professional Consult (permissible substitution): Payer: BC Managed Care – PPO | Admitting: Internal Medicine

## 2021-05-24 ENCOUNTER — Encounter: Payer: Self-pay | Admitting: Pulmonary Disease

## 2021-05-24 ENCOUNTER — Ambulatory Visit (INDEPENDENT_AMBULATORY_CARE_PROVIDER_SITE_OTHER): Payer: 59 | Admitting: Pulmonary Disease

## 2021-05-24 ENCOUNTER — Other Ambulatory Visit: Payer: Self-pay

## 2021-05-24 VITALS — BP 136/82 | HR 74 | Temp 98.1°F | Ht 72.0 in | Wt 251.0 lb

## 2021-05-24 DIAGNOSIS — J432 Centrilobular emphysema: Secondary | ICD-10-CM | POA: Diagnosis not present

## 2021-05-24 DIAGNOSIS — R0609 Other forms of dyspnea: Secondary | ICD-10-CM

## 2021-05-24 DIAGNOSIS — R0600 Dyspnea, unspecified: Secondary | ICD-10-CM

## 2021-05-24 DIAGNOSIS — Z720 Tobacco use: Secondary | ICD-10-CM | POA: Diagnosis not present

## 2021-05-24 NOTE — Assessment & Plan Note (Signed)
He was counseled.  Smoking cessation would be the most important intervention that would add years to his life. He is ready to start on the prescription of Wellbutrin He will qualify for lung cancer screening program and we will refer

## 2021-05-24 NOTE — Patient Instructions (Signed)
  Schedule PFTs Lung cancer screening program

## 2021-05-24 NOTE — Progress Notes (Signed)
Subjective:    Patient ID: James Serrano, male    DOB: 1965-10-21, 55 y.o.   MRN: [id number]  HPI  Chief Complaint  Patient presents with   Consult    Consistent Sob that seems to be worsening. Sob is mainly with exertion. Coughing consistently as well. Sometimes has a productive cough that produces a milky/clear mucus.     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 He reports dyspnea on exertion which has been worsening for about a year.  Shortness of breath now seems to occur during cough especially when humid weather or when he is getting exposed to construction daughter and smoke.  He reports early morning cough with clear sputum and sometimes coughing less during the day.  He reports intermittent wheezing.  He has chest colds twice a year last couple of episodes have been limited to 3 to 5 days. He was given Symbicort and albuterol by his PCP and he pretty much uses this as prescribed, use albuterol thrice daily with limited relief.  Chest x-ray 07/2020 was independently reviewed which shows hyperinflation without infiltrates  He smokes 2 to 3 packs/day starting at age 62, more than 60 pack years.  His daughter died at age 43 and this causes him a lot of stress.  Family history of lung cancer in his dad, brother died of sarcoma metastatic to lungs, mom has an unknown kind of cancer and also had asthma.     Past Medical History:  Diagnosis Date   Bronchitis    Tobacco use    Past Surgical History:  Procedure Laterality Date   ORIF ACETABULAR FRACTURE     TONSILLECTOMY      No Known Allergies  Social History   Socioeconomic History   Marital status: Married    Spouse name: Not on file   Number of children: Not on file   Years of education: Not on file   Highest education level: Not on file  Occupational History   Occupation: Pipe    Comment: Probation officer  Tobacco Use   Smoking status: Every  Day    Packs/day: 1.00    Years: 30.00    Pack years: 30.00    Types: Cigarettes   Smokeless tobacco: Never  Substance and Sexual Activity   Alcohol use: Yes    Comment: occasionally   Drug use: No   Sexual activity: Not on file  Other Topics Concern   Not on file  Social History Narrative   Not on file   Social Determinants of Health   Financial Resource Strain: Not on file  Food Insecurity: Not on file  Transportation Needs: Not on file  Physical Activity: Not on file  Stress: Not on file  Social Connections: Not on file  Intimate Partner Violence: Not on file     Family History  Problem Relation Age of Onset   Heart attack Father    Cancer Father    Cancer Brother    Cancer Mother      Review of Systems Shortness of breath with activity, productive cough Acid heartburn and indigestion Tooth problems Nasal congestion Anxiety Joint stiffness  Constitutional: negative for anorexia, fevers and sweats  Eyes: negative for irritation, redness and visual disturbance  Ears, nose, mouth, throat, and face: negative for earaches, epistaxis, nasal congestion and sore throat  Cardiovascular: negative for chest pain,  lower extremity edema, orthopnea, palpitations and syncope  Gastrointestinal: negative for abdominal pain, constipation, diarrhea,  melena, nausea and vomiting  Genitourinary:negative for dysuria, frequency and hematuria  Hematologic/lymphatic: negative for bleeding, easy bruising and lymphadenopathy  Musculoskeletal:negative for arthralgias, muscle weakness  Neurological: negative for coordination problems, gait problems, headaches and weakness  Endocrine: negative for diabetic symptoms including polydipsia, polyuria and weight loss     Objective:   Physical Exam  Gen. Pleasant, obese, in no distress, normal affect ENT - no pallor,icterus, no post nasal drip, class 2 airway Neck: No JVD, no thyromegaly, no carotid bruits Lungs: no use of accessory  muscles, no dullness to percussion, decreased without rales or rhonchi  Cardiovascular: Rhythm regular, heart sounds  normal, no murmurs or gallops, no peripheral edema Abdomen: soft and non-tender, no hepatosplenomegaly, BS normal. Musculoskeletal: No deformities, no cyanosis or clubbing Neuro:  alert, non focal, no tremors       Assessment & Plan:

## 2021-05-24 NOTE — Assessment & Plan Note (Signed)
He does seem to have a combination of chronic bronchitis and emphysema based on symptoms of chronic cough and hyperinflation on chest x-ray.  We will obtain PFTs to quantitate lung function. He is maintained on Symbicort and albuterol which we will continue at this time, based on lung function we may consider changing to [facility] to add a LAMA

## 2021-06-10 ENCOUNTER — Other Ambulatory Visit: Payer: Self-pay | Admitting: *Deleted

## 2021-06-10 DIAGNOSIS — F17210 Nicotine dependence, cigarettes, uncomplicated: Secondary | ICD-10-CM

## 2021-07-15 ENCOUNTER — Other Ambulatory Visit: Payer: Self-pay | Admitting: Family Medicine

## 2021-08-02 ENCOUNTER — Ambulatory Visit: Payer: 59 | Admitting: Pulmonary Disease

## 2021-08-06 ENCOUNTER — Telehealth: Payer: Self-pay | Admitting: Acute Care

## 2021-08-06 NOTE — Telephone Encounter (Signed)
Contacted spouse, [name] (dpr ok). Appt for [facility] rescheduled for 08/30/21 at 12:00 noon. Appt confirmed.  CT was already moved to 09/06/21

## 2021-08-09 ENCOUNTER — Ambulatory Visit (HOSPITAL_COMMUNITY): Payer: 59

## 2021-08-09 ENCOUNTER — Telehealth: Payer: 59 | Admitting: Acute Care

## 2021-08-11 ENCOUNTER — Telehealth: Payer: 59 | Admitting: Family

## 2021-08-11 DIAGNOSIS — R051 Acute cough: Secondary | ICD-10-CM | POA: Diagnosis not present

## 2021-08-11 DIAGNOSIS — J441 Chronic obstructive pulmonary disease with (acute) exacerbation: Secondary | ICD-10-CM

## 2021-08-11 MED ORDER — BENZONATATE 100 MG PO CAPS
100.0000 mg | ORAL_CAPSULE | Freq: Three times a day (TID) | ORAL | 0 refills | Status: DC | PRN
Start: 2021-08-11 — End: 2021-09-27

## 2021-08-11 MED ORDER — PREDNISONE 10 MG (21) PO TBPK: ORAL_TABLET | ORAL | 0 refills | Status: DC

## 2021-08-11 NOTE — Progress Notes (Signed)

## 2021-08-12 ENCOUNTER — Ambulatory Visit: Payer: 59

## 2021-08-12 ENCOUNTER — Telehealth: Payer: Self-pay | Admitting: Acute Care

## 2021-08-12 MED ORDER — MOLNUPIRAVIR 200 MG PO CAPS: 4.0000 | ORAL_CAPSULE | Freq: Two times a day (BID) | ORAL | 0 refills | Status: AC

## 2021-08-12 NOTE — Telephone Encounter (Signed)
Ptis postive for covid as of 08/11/21. Symptoms are bad cough, chest congestion, body aches and a sore throat. Fever yesterday but none today as of yet. Wonders if he needs medicine to help alleivate this. Was given prednisone through evist with urgent care. Pharmacy is [facility] in [place].

## 2021-08-12 NOTE — Telephone Encounter (Signed)
Freddi Starr, MD  You 1 hour ago (1:23 PM)   Can send in prescription of molnupiravir if patient would like   JD     Called and spoke with pt's spouse [name] letting her know recs per Dr. Erin Fulling and she verbalized understanding. Rx for molnupiravir has been sent to preferred pharmacy for pt. Nothing further needed.

## 2021-08-12 NOTE — Telephone Encounter (Addendum)
Called and spoke with pt's spouse James Serrano who stated that pt tested positive for covid 12/18.  James Serrano stated that pt's symptoms began Friday, 12/16 as pt felt fatigued but then symptoms got worse as pt developed a bad cough, had chest congestion, body aches, and a fever yesterday 12/18 of 101.5.  James Serrano stated that pt does not have a temp today.  Pt did an evisit yesterday 12/18 before pt knew that he had covid and was prescribed a pred taper and tessalon perles.  Due to pt testing positive for covid, they want to know if there is anything else pt might be able to take to help. Dr. Elsworth Soho, please advise.

## 2021-08-12 NOTE — Telephone Encounter (Signed)
Relooked at [facility] and noticed that it had Dr. Elsworth Soho showing on vacation today so sending this to provider of the day. Dr. Erin Fulling, please advise.

## 2021-08-25 DIAGNOSIS — C3490 Malignant neoplasm of unspecified part of unspecified bronchus or lung: Secondary | ICD-10-CM

## 2021-08-25 HISTORY — DX: Malignant neoplasm of unspecified part of unspecified bronchus or lung: C34.90

## 2021-08-30 ENCOUNTER — Encounter: Payer: 59 | Admitting: Acute Care

## 2021-09-03 ENCOUNTER — Ambulatory Visit: Payer: Self-pay | Admitting: Family Medicine

## 2021-09-06 ENCOUNTER — Encounter: Payer: Self-pay | Admitting: Primary Care

## 2021-09-06 ENCOUNTER — Other Ambulatory Visit: Payer: Self-pay

## 2021-09-06 ENCOUNTER — Ambulatory Visit (INDEPENDENT_AMBULATORY_CARE_PROVIDER_SITE_OTHER): Payer: 59 | Admitting: Primary Care

## 2021-09-06 ENCOUNTER — Ambulatory Visit (HOSPITAL_COMMUNITY)
Admission: RE | Admit: 2021-09-06 | Discharge: 2021-09-06 | Disposition: A | Discharge status: Home / Self Care | Payer: 59 | Source: Ambulatory Visit | Attending: Acute Care | Admitting: Acute Care

## 2021-09-06 DIAGNOSIS — F17210 Nicotine dependence, cigarettes, uncomplicated: Secondary | ICD-10-CM

## 2021-09-06 DIAGNOSIS — F17200 Nicotine dependence, unspecified, uncomplicated: Secondary | ICD-10-CM

## 2021-09-06 NOTE — Patient Instructions (Signed)
Thank you for participating in the [place] [facility] Cancer Screening Program. °It was our pleasure to meet you today. °We will call you with the results of your scan within the next few days. °Your scan will be assigned a Lung RADS category score by the physicians reading the scans.  °This Lung RADS score determines follow up scanning.  °See below for description of categories, and follow up screening recommendations. °We will be in touch to schedule your follow up screening annually or based on recommendations of our providers. °We will fax a copy of your scan results to your Primary Care Physician, or the physician who referred you to the program, to ensure they have the results. °Please call the office if you have any questions or concerns regarding your scanning experience or results.  °Our office number is [phone]. °Please speak with Denise Phelps, RN. She is our Lung Cancer Screening RN. °If she is unavailable when you call, please have the office staff send her a message. She will return your call at her earliest convenience. °Remember, if your scan is normal, we will scan you annually as long as you continue to meet the criteria for the program. (Age 55-77, Current smoker or smoker who has quit within the last 15 years). °If you are a smoker, remember, quitting is the single most powerful action that you can take to decrease your risk of lung cancer and other pulmonary, breathing related problems. °We know quitting is hard, and we are here to help.  °Please let us know if there is anything we can do to help you meet your goal of quitting. °If you are a former smoker, congratulations. We are proud of you! Remain smoke free! °Remember you can refer friends or family members through the number above.  °We will screen them to make sure they meet criteria for the program. °Thank you for helping us take better care of you by participating in Lung Screening. ° °You can receive free nicotine replacement therapy  ( patches, gum or mints) by calling 1-800-QUIT NOW. Please call so we can get you on the path to becoming  a non-smoker. I know it is hard, but you can do this! ° °Lung RADS Categories: ° °Lung RADS 1: no nodules or definitely non-concerning nodules.  °Recommendation is for a repeat annual scan in 12 months. ° °Lung RADS 2:  nodules that are non-concerning in appearance and behavior with a very low likelihood of becoming an active cancer. °Recommendation is for a repeat annual scan in 12 months. ° °Lung RADS 3: nodules that are probably non-concerning , includes nodules with a low likelihood of becoming an active cancer.  Recommendation is for a 6-month repeat screening scan. Often noted after an upper respiratory illness. We will be in touch to make sure you have no questions, and to schedule your 6-month scan. ° °Lung RADS 4 A: nodules with concerning findings, recommendation is most often for a follow up scan in 3 months or additional testing based on our provider's assessment of the scan. We will be in touch to make sure you have no questions and to schedule the recommended 3 month follow up scan. ° °Lung RADS 4 B:  indicates findings that are concerning. We will be in touch with you to schedule additional diagnostic testing based on our provider's  assessment of the scan. ° °Hypnosis for smoking cessation  °[employer] °[phone] ° °Acupuncture for smoking cessation  °[facility] °[phone]  °

## 2021-09-06 NOTE — Progress Notes (Signed)
Virtual Visit via Telephone Note  I connected with Marlaine Hind III on 09/06/21 at  9:00 AM EST by telephone and verified that I am speaking with the correct person using two identifiers.  Location: Patient: Home Provider: Office    I discussed the limitations, risks, security and privacy concerns of performing an evaluation and management service by telephone and the availability of in person appointments. I also discussed with the patient that there may be a patient responsible charge related to this service. The patient expressed understanding and agreed to proceed.    Martyn Ehrich, NP   Shared Decision Making Visit Lung Cancer Screening Program [phone])   Eligibility: Age 56 y.o. Pack Years Smoking History Calculation 27+ (# packs/per year x # years smoked) Recent History of coughing up blood  no Unexplained weight loss? no ( >Than 15 pounds within the last 6 months ) Prior History Lung / other cancer no (Diagnosis within the last 5 years already requiring surveillance chest CT Scans). Smoking Status Current Smoker Former Smokers: Years since quit: NA  Quit Date: NA  Visit Components: Discussion included one or more decision making aids. yes Discussion included risk/benefits of screening. yes Discussion included potential follow up diagnostic testing for abnormal scans. yes Discussion included meaning and risk of over diagnosis. yes Discussion included meaning and risk of False Positives. yes Discussion included meaning of total radiation exposure. yes  Counseling Included: Importance of adherence to annual lung cancer LDCT screening. yes Impact of comorbidities on ability to participate in the program. yes Ability and willingness to under diagnostic treatment. yes  Smoking Cessation Counseling: Current Smokers:  Discussed importance of smoking cessation. yes Information about tobacco cessation classes and interventions provided to patient. yes Patient  provided with "ticket" for LDCT Scan. NA Symptomatic Patient. no  Counseling(Intermediate counseling: > three minutes) 99406 Diagnosis Code: Tobacco Use Z72.0 Asymptomatic Patient yes  Counseling (Intermediate counseling: > three minutes counseling) [id number] Former Smokers:  Discussed the importance of maintaining cigarette abstinence. yes Diagnosis Code: Personal History of Nicotine Dependence. O13.086 Information about tobacco cessation classes and interventions provided to patient. Yes Patient provided with "ticket" for LDCT Scan. Na Written Order for Lung Cancer Screening with LDCT placed in Epic. Yes (CT Chest Lung Cancer Screening Low Dose W/O CM) [id number] Z12.2-Screening of respiratory organs Z87.891-Personal history of nicotine dependence  I have spent 25 minutes of face to face/ virtual visit   time with Mr Schroepfer discussing the risks and benefits of lung cancer screening. We viewed / discussed a power point together that explained in detail the above noted topics. We paused at intervals to allow for questions to be asked and answered to ensure understanding.We discussed that the single most powerful action that he can take to decrease his risk of developing lung cancer is to quit smoking. We discussed whether or not he is ready to commit to setting a quit date. We discussed options for tools to aid in quitting smoking including nicotine replacement therapy, non-nicotine medications, support groups, Quit Smart classes, and behavior modification. We discussed that often times setting smaller, more achievable goals, such as eliminating 1 cigarette a day for a week and then 2 cigarettes a day for a week can be helpful in slowly decreasing the number of cigarettes smoked. This allows for a sense of accomplishment as well as providing a clinical benefit. I provided him  with smoking cessation  information  with contact information for community resources, classes, free nicotine replacement therapy, and  access to mobile apps, text messaging, and on-line smoking cessation help. I have also provided him  the office contact information in the event he needs to contact me, or the screening staff. We discussed the time and location of the scan, and that either Doroteo Glassman RN, Joella Prince, RN  or I will call / send a letter with the results within 24-72 hours of receiving them. The patient verbalized understanding of all of  the above and had no further questions upon leaving the office. They have my contact information in the event they have any further questions.  I spent 3-5 minutes counseling on smoking cessation and the health risks of continued tobacco abuse.  I explained to the patient that there has been a high incidence of coronary artery disease noted on these exams. I explained that this is a non-gated exam therefore degree or severity cannot be determined. This patient is not on statin therapy. I have asked the patient to follow-up with their PCP regarding any incidental finding of coronary artery disease and management with diet or medication as their PCP  feels is clinically indicated. The patient verbalized understanding of the above and had no further questions upon completion of the visit.    Martyn Ehrich, NP

## 2021-09-07 ENCOUNTER — Ambulatory Visit
Admission: RE | Admit: 2021-09-07 | Discharge: 2021-09-07 | Disposition: A | Discharge status: Home / Self Care | Payer: 59 | Source: Ambulatory Visit | Attending: Family Medicine | Admitting: Family Medicine

## 2021-09-07 VITALS — BP 126/84 | HR 69 | Temp 98.1°F | Resp 18 | Ht 72.0 in | Wt 255.0 lb

## 2021-09-07 DIAGNOSIS — J069 Acute upper respiratory infection, unspecified: Secondary | ICD-10-CM

## 2021-09-07 DIAGNOSIS — Z1152 Encounter for screening for COVID-19: Secondary | ICD-10-CM | POA: Diagnosis not present

## 2021-09-07 DIAGNOSIS — J441 Chronic obstructive pulmonary disease with (acute) exacerbation: Secondary | ICD-10-CM | POA: Diagnosis not present

## 2021-09-07 MED ORDER — PREDNISONE 20 MG PO TABS: 40.0000 mg | ORAL_TABLET | Freq: Every day | ORAL | 0 refills | Status: DC

## 2021-09-07 MED ORDER — PROMETHAZINE-DM 6.25-15 MG/5ML PO SYRP: 5.0000 mL | ORAL_SOLUTION | Freq: Four times a day (QID) | ORAL | 0 refills | Status: DC | PRN

## 2021-09-07 NOTE — ED Provider Notes (Signed)
[facility]    CSN: [id number] Arrival date & time: 09/07/21  1244      History   Chief Complaint Chief Complaint  Patient presents with   Cough    HPI James Serrano is a 56 y.o. male.   Patient presenting today with 2 to 3-day history of cough, sinus pain and pressure, nasal congestion, postnasal drip, wheezing, occasional shortness of breath.  Denies fever, chills, body aches, chest pain, abdominal pain, nausea vomiting or diarrhea.  So far trying NyQuil and albuterol, Symbicort for COPD with only mild temporary relief.  No known sick contacts recently.  Does have a history of seasonal allergies not consistently on antihistamine.   Past Medical History:  Diagnosis Date   Bronchitis    Tobacco use     Patient Active Problem List   Diagnosis Date Noted   Tobacco abuse 05/24/2021   Chronic obstructive pulmonary disease ([place]) 02/17/2014   Chest pain 02/12/2014    Past Surgical History:  Procedure Laterality Date   ORIF ACETABULAR FRACTURE     TONSILLECTOMY         Home Medications    Prior to Admission medications   Medication Sig Start Date End Date Taking? Authorizing Provider  albuterol (VENTOLIN HFA) 108 (90 Base) MCG/ACT inhaler INHALE 2 PUFFS BY MOUTH EVERY 4 HOURS AS NEEDED 07/15/21  Yes Luking, Elayne Snare, MD  clonazePAM (KLONOPIN) 0.5 MG tablet Take 1 tablet (0.5 mg total) by mouth 2 (two) times daily as needed for anxiety. 09/07/20  Yes Kathyrn Drown, MD  predniSONE (DELTASONE) 20 MG tablet Take 2 tablets (40 mg total) by mouth daily with breakfast. 09/07/21  Yes Volney American, PA-C  promethazine-dextromethorphan (PROMETHAZINE-DM) 6.25-15 MG/5ML syrup Take 5 mLs by mouth 4 (four) times daily as needed. 09/07/21  Yes Volney American, PA-C  SYMBICORT 80-4.5 MCG/ACT inhaler INHALE 2 PUFFS BY MOUTH TWICE DAILY . APPOINTMENT REQUIRED FOR FUTURE REFILLS 03/25/21  Yes Luking, Elayne Snare, MD  benzonatate (TESSALON PERLES) 100 MG  capsule Take 1 capsule (100 mg total) by mouth 3 (three) times daily as needed. Patient not taking: Reported on 09/06/2021 08/11/21   Sharion Balloon, FNP  cetirizine (ZYRTEC ALLERGY) 10 MG tablet Take 1 tablet (10 mg total) by mouth daily. Patient not taking: Reported on 09/06/2021 08/10/20   Emerson Monte, FNP  pantoprazole (PROTONIX) 40 MG tablet Take 1 tablet (40 mg total) by mouth daily. 11/16/20   Kathyrn Drown, MD    Family History Family History  Problem Relation Age of Onset   Heart attack Father    Cancer Father    Cancer Brother    Cancer Mother     Social History Social History   Tobacco Use   Smoking status: Every Day    Packs/day: 1.00    Years: 30.00    Pack years: 30.00    Types: Cigarettes   Smokeless tobacco: Never  Substance Use Topics   Alcohol use: Yes    Comment: occasionally   Drug use: No     Allergies   Patient has no known allergies.   Review of Systems Review of Systems Per HPI  Physical Exam Triage Vital Signs ED Triage Vitals [09/07/21 1309]  Enc Vitals Group     BP 126/84     Pulse Rate 69     Resp 18     Temp 98.1 F (36.7 C)     Temp Source Oral  SpO2 94 %     Weight 255 lb (115.7 kg)     Height 6' (1.829 m)     Head Circumference      Peak Flow      Pain Score 0     Pain Loc      Pain Edu?      Excl. in [place]?    No data found.  Updated Vital Signs BP 126/84 (BP Location: Right Arm)    Pulse 69    Temp 98.1 F (36.7 C) (Oral)    Resp 18    Ht 6' (1.829 m)    Wt 255 lb (115.7 kg)    SpO2 94%    BMI 34.58 kg/m   Visual Acuity Right Eye Distance:   Left Eye Distance:   Bilateral Distance:    Right Eye Near:   Left Eye Near:    Bilateral Near:     Physical Exam Vitals and nursing note reviewed.  Constitutional:      Appearance: He is well-developed.  HENT:     Head: Atraumatic.     Right Ear: External ear normal.     Left Ear: External ear normal.     Nose: Rhinorrhea present.     Mouth/Throat:      Pharynx: Posterior oropharyngeal erythema present. No oropharyngeal exudate.  Eyes:     Conjunctiva/sclera: Conjunctivae normal.     Pupils: Pupils are equal, round, and reactive to light.  Cardiovascular:     Rate and Rhythm: Normal rate and regular rhythm.  Pulmonary:     Effort: Pulmonary effort is normal. No respiratory distress.     Breath sounds: Wheezing present. No rales.  Musculoskeletal:        General: Normal range of motion.     Cervical back: Normal range of motion and neck supple.  Lymphadenopathy:     Cervical: No cervical adenopathy.  Skin:    General: Skin is warm and dry.  Neurological:     Mental Status: He is alert and oriented to person, place, and time.  Psychiatric:        Behavior: Behavior normal.     UC Treatments / Results  Labs (all labs ordered are listed, but only abnormal results are displayed) Labs Reviewed  COVID-19, FLU A+B NAA    EKG   Radiology No results found.  Procedures Procedures (including critical care time)  Medications Ordered in UC Medications - No data to display  Initial Impression / Assessment and Plan / UC Course  I have reviewed the triage vital signs and the nursing notes.  Pertinent labs & imaging results that were available during my care of the patient were reviewed by me and considered in my medical decision making (see chart for details).     Vital signs benign and reassuring, COVID and flu testing pending.  Will treat for viral upper respiratory infection with COPD exacerbation with prednisone, Phenergan DM, continued albuterol and Symbicort regimen.  Supportive over-the-counter medications and home care reviewed.  Return for worsening symptoms.  Final Clinical Impressions(s) / UC Diagnoses   Final diagnoses:  Encounter for screening for COVID-19  Viral URI with cough  COPD exacerbation [facility])   Discharge Instructions   None    ED Prescriptions     Medication Sig Dispense Auth. Provider    predniSONE (DELTASONE) 20 MG tablet Take 2 tablets (40 mg total) by mouth daily with breakfast. 10 tablet Volney American, PA-C   promethazine-dextromethorphan (PROMETHAZINE-DM) 6.25-15 MG/5ML syrup  Take 5 mLs by mouth 4 (four) times daily as needed. 100 mL Volney American, [place]      PDMP not reviewed this encounter.   Volney American, [place] 09/07/21 1423

## 2021-09-07 NOTE — ED Triage Notes (Signed)
Cough, sinus congestion for last several days. Denies any known fevers. Pt reports sore throat yesterday but denies at this time.

## 2021-09-08 LAB — COVID-19, FLU A+B NAA
Influenza A, NAA: NOT DETECTED
Influenza B, NAA: NOT DETECTED
SARS-CoV-2, NAA: NOT DETECTED

## 2021-09-09 ENCOUNTER — Telehealth (INDEPENDENT_AMBULATORY_CARE_PROVIDER_SITE_OTHER): Payer: 59 | Admitting: Nurse Practitioner

## 2021-09-09 ENCOUNTER — Telehealth: Payer: Self-pay | Admitting: Acute Care

## 2021-09-09 ENCOUNTER — Telehealth: Payer: Self-pay | Admitting: Pulmonary Disease

## 2021-09-09 DIAGNOSIS — J40 Bronchitis, not specified as acute or chronic: Secondary | ICD-10-CM

## 2021-09-09 MED ORDER — DOXYCYCLINE HYCLATE 100 MG PO TABS: 100.0000 mg | ORAL_TABLET | Freq: Two times a day (BID) | ORAL | 0 refills | Status: DC

## 2021-09-09 NOTE — Progress Notes (Signed)
We are sorry that you are not feeling well.  Here is how we plan to help!  Based on your presentation I believe you most likely have A cough due to bacteria.  When patients have a fever and a productive cough with a change in color or increased sputum production, we are concerned about bacterial bronchitis.  If left untreated it can progress to pneumonia.  If your symptoms do not improve with your treatment plan it is important that you contact your provider.   I have prescribed Doxycycline 100 mg twice a day for 7 days     In addition you may use A non-prescription cough medication called Mucinex DM: take 2 tablets every 12 hours. We cannot prescribe anything aside from the clear cough medication and the liquid medication that you are currently using. Since neither have bene helpful we recommend using your inhalers as directed, your rescue inhaler every 4-6 hours (let us know if you need a refill) and using Mucinex OTC to help with the cough in combination with the antibiotics we are prescribing. You should also finish the prednisone as previously prescribed.     From your responses in the eVisit questionnaire you describe inflammation in the upper respiratory tract which is causing a significant cough.  This is commonly called Bronchitis and has four common causes:   Allergies Viral Infections Acid Reflux Bacterial Infection Allergies, viruses and acid reflux are treated by controlling symptoms or eliminating the cause. An example might be a cough caused by taking certain blood pressure medications. You stop the cough by changing the medication. Another example might be a cough caused by acid reflux. Controlling the reflux helps control the cough.  USE OF BRONCHODILATOR ("RESCUE") INHALERS: There is a risk from using your bronchodilator too frequently.  The risk is that over-reliance on a medication which only relaxes the muscles surrounding the breathing tubes can reduce the effectiveness of  medications prescribed to reduce swelling and congestion of the tubes themselves.  Although you feel brief relief from the bronchodilator inhaler, your asthma may actually be worsening with the tubes becoming more swollen and filled with mucus.  This can delay other crucial treatments, such as oral steroid medications. If you need to use a bronchodilator inhaler daily, several times per day, you should discuss this with your provider.  There are probably better treatments that could be used to keep your asthma under control.     HOME CARE Only take medications as instructed by your medical team. Complete the entire course of an antibiotic. Drink plenty of fluids and get plenty of rest. Avoid close contacts especially the very young and the elderly Cover your mouth if you cough or cough into your sleeve. Always remember to wash your hands A steam or ultrasonic humidifier can help congestion.   GET HELP RIGHT AWAY IF: You develop worsening fever. You become short of breath You cough up blood. Your symptoms persist after you have completed your treatment plan MAKE SURE YOU  Understand these instructions. Will watch your condition. Will get help right away if you are not doing well or get worse.    Thank you for choosing an e-visit.  Your e-visit answers were reviewed by a board certified advanced clinical practitioner to complete your personal care plan. Depending upon the condition, your plan could have included both over the counter or prescription medications.  Please review your pharmacy choice. Make sure the pharmacy is open so you can pick up prescription now. If  there is a problem, you may contact your provider through [employer] and have the prescription routed to another pharmacy.  Your safety is important to [place]. If you have drug allergies check your prescription carefully.   For the next 24 hours you can use MyChart to ask questions about today's visit, request a non-urgent  call back, or ask for a work or school excuse. You will get an email in the next two days asking about your experience. I hope that your e-visit has been valuable and will speed your recovery.   I spent approximately 7 minutes reviewing the patient's history, current symptoms and coordinating their plan of care today.    Meds ordered this encounter  Medications   doxycycline (VIBRA-TABS) 100 MG tablet    Sig: Take 1 tablet (100 mg total) by mouth 2 (two) times daily for 7 days.    Dispense:  14 tablet    Refill:  0

## 2021-09-09 NOTE — Telephone Encounter (Signed)
Called and spoke with pt's spouse [name] letting her know that pt will need to have another covid test done 3 days prior to PFT 1/20 due to the one that was done 1/14 being too far out. She verbalized understanding and said that she did go ahead and get pt scheduled to have a covid test done 1/17. Nothing further needed.

## 2021-09-09 NOTE — Telephone Encounter (Signed)
Attempted to call pt's spouse [name] but unable to reach. Left message for her to return call.

## 2021-09-09 NOTE — Telephone Encounter (Signed)
Call report on LDCT done 09/05/21  Impression:    IMPRESSION: 1. Lung-RADS 4B, suspicious. Additional imaging evaluation or consultation with Pulmonology or Thoracic Surgery recommended. Dominant 19.7 mm central left upper lobe irregular pulmonary nodule, concerning for primary bronchogenic carcinoma. PET-CT suggested for further characterization. 2. Three-vessel coronary atherosclerosis. 3. Stable left adrenal adenoma. 4. Aortic Atherosclerosis (ICD10-I70.0) and Emphysema (ICD10-J43.9).   These results will be called to the ordering clinician or representative by the Radiologist Assistant, and communication documented in the PACS or [employer].     Electronically Signed   By: Ilona Sorrel M.D.   On: 09/08/2021 10:21

## 2021-09-10 ENCOUNTER — Telehealth: Payer: Self-pay | Admitting: Acute Care

## 2021-09-10 NOTE — Telephone Encounter (Signed)
I attempted to call the patient with the result of his low dose CT. His wife answered the phone. The patient is out of town. He will be back Friday morning. I will call him with the results then.  There is no documentation of DPR in the Chart. I will clarify this Friday morning when I speak to the patient , and document his wife is able to receive results if he is in agreement.

## 2021-09-10 NOTE — Telephone Encounter (Signed)
I have attempted to call the patient with the results of his low dose Ct Chest. There was no answer. I have left a HIPPA compliant message on the patient's VM with the office contact information requesting he call the office for his results. Will attempt to call again in next 24-48 hours if patient does not call first.

## 2021-09-13 ENCOUNTER — Other Ambulatory Visit: Payer: Self-pay | Admitting: Acute Care

## 2021-09-13 ENCOUNTER — Other Ambulatory Visit: Payer: Self-pay

## 2021-09-13 ENCOUNTER — Telehealth: Payer: Self-pay | Admitting: Acute Care

## 2021-09-13 ENCOUNTER — Ambulatory Visit (INDEPENDENT_AMBULATORY_CARE_PROVIDER_SITE_OTHER): Payer: 59 | Admitting: Pulmonary Disease

## 2021-09-13 DIAGNOSIS — R0609 Other forms of dyspnea: Secondary | ICD-10-CM | POA: Diagnosis not present

## 2021-09-13 DIAGNOSIS — R911 Solitary pulmonary nodule: Secondary | ICD-10-CM

## 2021-09-13 LAB — PULMONARY FUNCTION TEST
DL/VA % pred: 119 %
DL/VA: 5.09 ml/min/mmHg/L
DLCO cor % pred: 92 %
DLCO cor: 27.71 ml/min/mmHg
DLCO unc % pred: 92 %
DLCO unc: 27.71 ml/min/mmHg
FEF 25-75 Post: 0.78 L/sec
FEF 25-75 Pre: 0.64 L/sec
FEF2575-%Change-Post: 21 %
FEF2575-%Pred-Post: 23 %
FEF2575-%Pred-Pre: 19 %
FEV1-%Change-Post: 8 %
FEV1-%Pred-Post: 37 %
FEV1-%Pred-Pre: 34 %
FEV1-Post: 1.47 L
FEV1-Pre: 1.35 L
FEV1FVC-%Change-Post: 3 %
FEV1FVC-%Pred-Pre: 63 %
FEV6-%Change-Post: 4 %
FEV6-%Pred-Post: 58 %
FEV6-%Pred-Pre: 56 %
FEV6-Post: 2.94 L
FEV6-Pre: 2.8 L
FEV6FVC-%Pred-Post: 104 %
FEV6FVC-%Pred-Pre: 104 %
FVC-%Change-Post: 4 %
FVC-%Pred-Post: 56 %
FVC-%Pred-Pre: 53 %
FVC-Post: 2.94 L
FVC-Pre: 2.8 L
Post FEV1/FVC ratio: 50 %
Post FEV6/FVC ratio: 100 %
Pre FEV1/FVC ratio: 48 %
Pre FEV6/FVC Ratio: 100 %
RV % pred: 221 %
RV: 5.06 L
TLC % pred: 113 %
TLC: 8.4 L

## 2021-09-13 NOTE — Progress Notes (Signed)
Full PFT performed today. °

## 2021-09-13 NOTE — Telephone Encounter (Signed)
Judson Roch, can you please advise? Thanks!

## 2021-09-13 NOTE — Telephone Encounter (Signed)
I called and spoke with Mr. Bucklin wife, [name], who has DPR for [facility].I discussed the results of Mr. Delduca Low Dose CT ( Patient is out of town). I explained that his scan was read as a Lung RADS 4 B indicates suspicious findings for which additional diagnostic testing and or tissue sampling is recommended.  I told [name] that the patient had several lung nodules that we need to look at closer. The largest is a 1.9 cm nodule in the left upper lobe. I explained that we would like to do a PET scan to better evaluate this area, and the other nodules we noted. She is in agreement with this plan. I have placed the order for a PET scan. [name] understands that she will get a call to schedule this. I have specified in the order that the patient needs a Friday morning scan, as he travels for work Monday-Thursday. Ms. Pindell verbalized understanding of the above. She will call her husband and explain the results of the scan and the plan for imaging to better evaluate.

## 2021-09-13 NOTE — Telephone Encounter (Signed)
Pt's wife [name] called back to speak with Judson Roch.  She had s/w her earlier.  [name] is on the [facility] for [facility].  Stated pt is going out of town in the next hour.  [phone] [name]) and his number is [phone].

## 2021-09-13 NOTE — Patient Instructions (Signed)
Full PFT performed today. °

## 2021-09-14 ENCOUNTER — Ambulatory Visit
Admission: RE | Admit: 2021-09-14 | Discharge: 2021-09-14 | Disposition: A | Discharge status: Home / Self Care | Payer: 59 | Source: Ambulatory Visit | Attending: Urgent Care | Admitting: Urgent Care

## 2021-09-14 ENCOUNTER — Other Ambulatory Visit: Payer: Self-pay

## 2021-09-14 ENCOUNTER — Ambulatory Visit (INDEPENDENT_AMBULATORY_CARE_PROVIDER_SITE_OTHER): Payer: 59

## 2021-09-14 VITALS — BP 142/90 | HR 92 | Temp 98.5°F | Resp 18

## 2021-09-14 DIAGNOSIS — J3489 Other specified disorders of nose and nasal sinuses: Secondary | ICD-10-CM | POA: Diagnosis not present

## 2021-09-14 DIAGNOSIS — F17200 Nicotine dependence, unspecified, uncomplicated: Secondary | ICD-10-CM

## 2021-09-14 DIAGNOSIS — R059 Cough, unspecified: Secondary | ICD-10-CM | POA: Diagnosis not present

## 2021-09-14 DIAGNOSIS — R053 Chronic cough: Secondary | ICD-10-CM | POA: Diagnosis not present

## 2021-09-14 DIAGNOSIS — J018 Other acute sinusitis: Secondary | ICD-10-CM

## 2021-09-14 DIAGNOSIS — J449 Chronic obstructive pulmonary disease, unspecified: Secondary | ICD-10-CM

## 2021-09-14 DIAGNOSIS — R0982 Postnasal drip: Secondary | ICD-10-CM

## 2021-09-14 MED ORDER — AMOXICILLIN-POT CLAVULANATE 875-125 MG PO TABS
1.0000 | ORAL_TABLET | Freq: Two times a day (BID) | ORAL | 0 refills | Status: DC
Start: 2021-09-14 — End: 2021-09-27

## 2021-09-14 MED ORDER — PREDNISONE 50 MG PO TABS: 50.0000 mg | ORAL_TABLET | Freq: Every day | ORAL | 0 refills | Status: DC

## 2021-09-14 NOTE — ED Triage Notes (Signed)
Pt reports headache, cough and nasal congestion x 2 week. Pt states he is not getting better, he finished prednisone on 09/11/21 doxycycline 100 mg started 09/10/21.

## 2021-09-14 NOTE — ED Provider Notes (Signed)
[place]   MRN: [id number] DOB: December 08, 1965  Subjective:   James Serrano is a 56 y.o. male with PMH of COPD presenting for 2-week history of persistent sinus congestion, sinus headaches, sinus pressure and pain closer to his nose on either side.  He has also had a persistent productive cough with some shortness of breath.  Has a history of COPD, bronchitis.  He is on Symbicort but still smokes, does 1 PPD.  No chest pain or wheezing.  Patient was seen here 09/07/2021, started on oral steroid course.  He did not have any improvement and therefore had a video visit 09/09/2021 and was diagnosed with bronchitis, prescribed doxycycline.  He still has the same symptoms and generally feels worse.  No current facility-administered medications for this encounter.  Current Outpatient Medications:    albuterol (VENTOLIN HFA) 108 (90 Base) MCG/ACT inhaler, INHALE 2 PUFFS BY MOUTH EVERY 4 HOURS AS NEEDED, Disp: 9 g, Rfl: 1   benzonatate (TESSALON PERLES) 100 MG capsule, Take 1 capsule (100 mg total) by mouth 3 (three) times daily as needed. (Patient not taking: Reported on 09/06/2021), Disp: 20 capsule, Rfl: 0   cetirizine (ZYRTEC ALLERGY) 10 MG tablet, Take 1 tablet (10 mg total) by mouth daily. (Patient not taking: Reported on 09/06/2021), Disp: 30 tablet, Rfl: 0   clonazePAM (KLONOPIN) 0.5 MG tablet, Take 1 tablet (0.5 mg total) by mouth 2 (two) times daily as needed for anxiety., Disp: 20 tablet, Rfl: 0   doxycycline (VIBRA-TABS) 100 MG tablet, Take 1 tablet (100 mg total) by mouth 2 (two) times daily for 7 days., Disp: 14 tablet, Rfl: 0   pantoprazole (PROTONIX) 40 MG tablet, Take 1 tablet (40 mg total) by mouth daily., Disp: 30 tablet, Rfl: 5   predniSONE (DELTASONE) 20 MG tablet, Take 2 tablets (40 mg total) by mouth daily with breakfast., Disp: 10 tablet, Rfl: 0   promethazine-dextromethorphan (PROMETHAZINE-DM) 6.25-15 MG/5ML syrup, Take 5 mLs by mouth 4 (four) times daily  as needed., Disp: 100 mL, Rfl: 0   SYMBICORT 80-4.5 MCG/ACT inhaler, INHALE 2 PUFFS BY MOUTH TWICE DAILY . APPOINTMENT REQUIRED FOR FUTURE REFILLS, Disp: 11 g, Rfl: 3   No Known Allergies  Past Medical History:  Diagnosis Date   Bronchitis    Tobacco use      Past Surgical History:  Procedure Laterality Date   ORIF ACETABULAR FRACTURE     TONSILLECTOMY      Family History  Problem Relation Age of Onset   Heart attack Father    Cancer Father    Cancer Brother    Cancer Mother     Social History   Tobacco Use   Smoking status: Every Day    Packs/day: 1.00    Years: 30.00    Pack years: 30.00    Types: Cigarettes   Smokeless tobacco: Never  Substance Use Topics   Alcohol use: Yes    Comment: occasionally   Drug use: No    ROS   Objective:   Vitals: BP (!) 142/90 (BP Location: Right Arm)    Pulse 92    Temp 98.5 F (36.9 C) (Oral)    Resp 18    SpO2 92%   Physical Exam Constitutional:      General: He is not in acute distress.    Appearance: Normal appearance. He is well-developed and normal weight. He is not ill-appearing, toxic-appearing or diaphoretic.  HENT:     Head: Normocephalic and atraumatic.  Right Ear: External ear normal.     Left Ear: External ear normal.     Nose: Nose normal.     Mouth/Throat:     Mouth: Mucous membranes are moist.     Pharynx: Oropharynx is clear.  Eyes:     General: No scleral icterus.       Right eye: No discharge.        Left eye: No discharge.     Extraocular Movements: Extraocular movements intact.  Neck:     Meningeal: Brudzinski's sign and Kernig's sign absent.  Cardiovascular:     Rate and Rhythm: Normal rate and regular rhythm.     Heart sounds: Normal heart sounds. No murmur heard.   No friction rub. No gallop.  Pulmonary:     Effort: Pulmonary effort is normal. No respiratory distress.     Breath sounds: No stridor. Decreased breath sounds (throughout) present. No wheezing, rhonchi or rales.   Musculoskeletal:     Cervical back: Normal range of motion and neck supple. No rigidity.  Lymphadenopathy:     Cervical: No cervical adenopathy.  Neurological:     Mental Status: He is alert and oriented to person, place, and time.     Cranial Nerves: No cranial nerve deficit, dysarthria or facial asymmetry.     Sensory: No sensory deficit.     Motor: No weakness.     Coordination: Romberg sign negative. Coordination normal.     Gait: Gait normal.  Psychiatric:        Mood and Affect: Mood normal.        Behavior: Behavior normal.        Thought Content: Thought content normal.        Judgment: Judgment normal.    DG Chest 2 View  Result Date: 09/14/2021 CLINICAL DATA:  Cough and congestion for 2 weeks. EXAM: CHEST - 2 VIEW COMPARISON:  08/20/2020 and prior radiographs FINDINGS: The cardiomediastinal silhouette is unremarkable. COPD changes again noted. There is no evidence of focal airspace disease, pulmonary edema, suspicious pulmonary nodule/mass, pleural effusion, or pneumothorax. No acute bony abnormalities are identified. IMPRESSION: COPD without evidence of acute cardiopulmonary disease. Electronically Signed   By: Margarette Canada M.D.   On: 09/14/2021 13:36     Assessment and Plan :   PDMP not reviewed this encounter.  1. Acute non-recurrent sinusitis of other sinus   2. Sinus pressure   3. Persistent cough   4. Post-nasal drainage   5. Chronic obstructive pulmonary disease, unspecified COPD type ([place])   6. Smoker    Advised patient stop taking doxycycline as it is not helping and not first-line for what I suspect is an acute sinus infection.  Recommended Augmentin, 1 more round of prednisone given his COPD and ongoing productive cough, shortness of breath in the absence of pneumonia.  Maintain breathing treatments.  Use supportive care otherwise. Counseled patient on potential for adverse effects with medications prescribed/recommended today, ER and return-to-clinic  precautions discussed, patient verbalized understanding.    Jaynee Eagles, PA-C 09/14/21 1345

## 2021-09-27 ENCOUNTER — Other Ambulatory Visit: Payer: Self-pay

## 2021-09-27 ENCOUNTER — Ambulatory Visit (INDEPENDENT_AMBULATORY_CARE_PROVIDER_SITE_OTHER): Payer: 59 | Admitting: Family Medicine

## 2021-09-27 ENCOUNTER — Ambulatory Visit (HOSPITAL_COMMUNITY)
Admission: RE | Admit: 2021-09-27 | Discharge: 2021-09-27 | Disposition: A | Discharge status: Home / Self Care | Payer: 59 | Source: Ambulatory Visit | Attending: Acute Care | Admitting: Acute Care

## 2021-09-27 ENCOUNTER — Encounter: Payer: Self-pay | Admitting: Family Medicine

## 2021-09-27 VITALS — BP 122/80 | Temp 98.1°F | Wt 250.4 lb

## 2021-09-27 DIAGNOSIS — E785 Hyperlipidemia, unspecified: Secondary | ICD-10-CM | POA: Diagnosis not present

## 2021-09-27 DIAGNOSIS — Z79899 Other long term (current) drug therapy: Secondary | ICD-10-CM | POA: Diagnosis not present

## 2021-09-27 DIAGNOSIS — Z125 Encounter for screening for malignant neoplasm of prostate: Secondary | ICD-10-CM

## 2021-09-27 DIAGNOSIS — R911 Solitary pulmonary nodule: Secondary | ICD-10-CM | POA: Insufficient documentation

## 2021-09-27 DIAGNOSIS — E781 Pure hyperglyceridemia: Secondary | ICD-10-CM | POA: Diagnosis not present

## 2021-09-27 DIAGNOSIS — J432 Centrilobular emphysema: Secondary | ICD-10-CM

## 2021-09-27 LAB — GLUCOSE, CAPILLARY: Glucose-Capillary: 109 mg/dL — ABNORMAL HIGH (ref 70–99)

## 2021-09-27 MED ORDER — PANTOPRAZOLE SODIUM 40 MG PO TBEC: 40.0000 mg | DELAYED_RELEASE_TABLET | Freq: Every day | ORAL | 5 refills | Status: DC

## 2021-09-27 MED ORDER — SYMBICORT 80-4.5 MCG/ACT IN AERO: INHALATION_SPRAY | RESPIRATORY_TRACT | 3 refills | Status: DC

## 2021-09-27 MED ORDER — ALBUTEROL SULFATE HFA 108 (90 BASE) MCG/ACT IN AERS: 2.0000 | INHALATION_SPRAY | RESPIRATORY_TRACT | 12 refills | Status: DC | PRN

## 2021-09-27 MED ORDER — FLUDEOXYGLUCOSE F - 18 (FDG) INJECTION
12.5000 | Freq: Once | INTRAVENOUS | Status: AC
  Administered 2021-09-27: 14 via INTRAVENOUS

## 2021-09-27 NOTE — Patient Instructions (Signed)
Nicotine Patches What is this medication? NICOTINE ([place] teen) helps you quit smoking. It reduces cravings for nicotine, the addictive substance found in tobacco. It may also help reduce symptoms of withdrawal. It is most effective when used in combination with a stop-smoking program. This medicine may be used for other purposes; ask your health care provider or pharmacist if you have questions. COMMON BRAND NAME(S): Habitrol, Nicoderm CQ, Nicotrol What should I tell my care team before I take this medication? They need to know if you have any of these conditions: Diabetes Heart disease, angina, irregular heartbeat or previous heart attack High blood pressure Lung disease, including asthma Overactive thyroid Pheochromocytoma Seizures or a history of seizures Skin problems, like eczema Stomach problems or ulcers An unusual or allergic reaction to nicotine, adhesives, other medications, foods, dyes, or preservatives Pregnant or trying to get pregnant Breast-feeding How should I use this medication? This medication is for external use only. Follow the directions on the label. Do not cut or trim the patch. After applying the patch, wash your hands. Change the patch every day in the morning, keeping to a regular schedule. When you apply a new patch, use a new area of skin. Wait at least 1 week before using the same area again. This medication comes with INSTRUCTIONS FOR USE. Ask your pharmacist for directions on how to use this medication. Read the information carefully. Talk to your pharmacist or care team if you have questions. Talk to your care team about the use of this medication in children. Special care may be needed. Overdosage: If you think you have taken too much of this medicine contact a poison control center or emergency room at once. NOTE: This medicine is only for you. Do not share this medicine with others. What if I miss a dose? If you forget to replace a patch, use it as soon  as you can. Only use one patch at a time and do not leave on the skin for longer than directed. If a patch falls off, you can replace it, but keep to your schedule and remove the patch at the right time. What may interact with this medication? Certain medications for lung or breathing disease, like asthma Medications for blood pressure Medications for depression This list may not describe all possible interactions. Give your health care provider a list of all the medicines, herbs, non-prescription drugs, or dietary supplements you use. Also tell them if you smoke, drink alcohol, or use illegal drugs. Some items may interact with your medicine. What should I watch for while using this medication? Visit your care team for regular checks on your progress. Talk to your care team about what you can do to improve your chances of quitting. If you are going to need surgery, an MRI, CT scan, or other procedure, tell your care team that you are using this medication. You may need to remove the patch before the procedure. What side effects may I notice from receiving this medication? Side effects that you should report to your care team as soon as possible: Allergic reactions--skin rash, itching, hives, swelling of the face, lips, tongue, or throat Heart palpitations--rapid, pounding, or irregular heartbeat Increase in blood pressure Side effects that usually do not require medical attention (report to your care team if they continue or are bothersome): Dizziness Headache Heartburn Hiccups Irritation at application site Trouble sleeping This list may not describe all possible side effects. Call your doctor for medical advice about side effects. You may  report side effects to FDA at 1-800-FDA-1088. Where should I keep my medication? This product may have enough nicotine in it to make children and pets sick. Keep it away from children and pets. After using, throw away as directed on the package. Store at  room temperature between 20 and 25 degrees C (68 and 77 degrees F). Protect from heat and light. Keep this medication in the original container until ready to use. Throw away unused medication after the expiration date. NOTE: This sheet is a summary. It may not cover all possible information. If you have questions about this medicine, talk to your doctor, pharmacist, or health care provider.  2022 Elsevier/Gold Standard (2021-04-30 00:00:00)

## 2021-09-27 NOTE — Progress Notes (Signed)
° °  Subjective:    Patient ID: James Serrano, male    DOB: 1966-01-24, 56 y.o.   MRN: [id number]  HPI Pt here for follow up. Pt states things are going well. No issues at this time. Patient does have underlying COPD He relates using the Symbicort twice daily Uses the albuterol frequently as well Exposed to a lot of dust and other debris at work In addition to this patient is a smoker he has been counseled to quit Also has underlying reflux issues under decent control States his depression is resolved Recent CT scan for screening purposes showed a possibility of a tumor Has PET scan coming up   Review of Systems     Objective:   Physical Exam  General-in no acute distress Eyes-no discharge Lungs-respiratory rate normal, CTA CV-no murmurs,RRR Extremities skin warm dry no edema Neuro grossly normal Behavior normal, alert       Assessment & Plan:   Abnormal CT scan Await PET scan Very important to quit smoking Counseled to quit   1. Hyperlipidemia, unspecified hyperlipidemia type Lipid decent control continue current measures statins will be recommended if numbers come back abnormal in a significant way - Basic Metabolic Panel (BMET) - Lipid Profile - Hepatic function panel - PSA  2. High risk medication use Healthy diet getting rid of smoking discussed - Basic Metabolic Panel (BMET) - Lipid Profile - Hepatic function panel - PSA  3. Screening PSA (prostate specific antigen) Screening - Basic Metabolic Panel (BMET) - Lipid Profile - Hepatic function panel - PSA  4. Hypertriglyceridemia Healthy diet recommended getting rid of smoking - Basic Metabolic Panel (BMET) - Lipid Profile - Hepatic function panel - PSA  5. Centrilobular emphysema ([place]) Patient counseled to quit smoking Continue medications   Pulmonary nodule await the PET scan being followed by pulmonary

## 2021-09-30 ENCOUNTER — Telehealth: Payer: Self-pay | Admitting: Acute Care

## 2021-09-30 NOTE — Telephone Encounter (Signed)
Routing to Eric Form NP since she ordered scan.

## 2021-10-01 ENCOUNTER — Telehealth: Payer: Self-pay | Admitting: Acute Care

## 2021-10-01 ENCOUNTER — Other Ambulatory Visit: Payer: Self-pay | Admitting: Acute Care

## 2021-10-01 DIAGNOSIS — R911 Solitary pulmonary nodule: Secondary | ICD-10-CM

## 2021-10-01 NOTE — Telephone Encounter (Signed)
I have called the patient's wife with the results of his low dose CT Chest. His wife James Serrano answered the phone. The patient is out of town. Wife has DPR. I explained that his PET was concerning for cancer in the nodule we were concerned about. I also explained that there was no metastatic spread. I spoke with Dr. Valeta Harms, who felt the patient should be seen by [facility] to be evaluated for lobectomy. Patient's wife is in agreement with this plan. She will notify him of the results, and she is the contact for [facility] to schedule appointment . I have shared this information with [name], RN at [facility]. Pt. Will be scheduled to see Thoracic Surgery.

## 2021-10-04 ENCOUNTER — Ambulatory Visit (INDEPENDENT_AMBULATORY_CARE_PROVIDER_SITE_OTHER): Payer: 59 | Admitting: Pulmonary Disease

## 2021-10-04 ENCOUNTER — Other Ambulatory Visit: Payer: Self-pay

## 2021-10-04 ENCOUNTER — Encounter: Payer: Self-pay | Admitting: Pulmonary Disease

## 2021-10-04 ENCOUNTER — Ambulatory Visit: Payer: 59 | Admitting: Nurse Practitioner

## 2021-10-04 DIAGNOSIS — R911 Solitary pulmonary nodule: Secondary | ICD-10-CM | POA: Diagnosis not present

## 2021-10-04 DIAGNOSIS — Z720 Tobacco use: Secondary | ICD-10-CM | POA: Diagnosis not present

## 2021-10-04 DIAGNOSIS — J432 Centrilobular emphysema: Secondary | ICD-10-CM | POA: Diagnosis not present

## 2021-10-04 DIAGNOSIS — C3490 Malignant neoplasm of unspecified part of unspecified bronchus or lung: Secondary | ICD-10-CM | POA: Insufficient documentation

## 2021-10-04 MED ORDER — BREZTRI AEROSPHERE 160-9-4.8 MCG/ACT IN AERO: 2.0000 | INHALATION_SPRAY | Freq: Two times a day (BID) | RESPIRATORY_TRACT | 0 refills | Status: DC

## 2021-10-04 MED ORDER — BREZTRI AEROSPHERE 160-9-4.8 MCG/ACT IN AERO: 2.0000 | INHALATION_SPRAY | Freq: Two times a day (BID) | RESPIRATORY_TRACT | 11 refills | Status: DC

## 2021-10-04 NOTE — Assessment & Plan Note (Signed)
Unfortunately left upper lobe nodule is hypermetabolic and would be consistent with early stage lung cancer.  It has an endobronchial component and would be easily amenable to bronchoscopic biopsy. We discussed options including biopsy, surgical resection/lobectomy and radiation. He has a postbronchodilator FEV1 of 1.47, his diffusion capacity is surprisingly normal.  He does seem to have adequate lung function for lobectomy and that should be our primary option.  If for some reason he is considered a poor surgical candidate, then we can consider bronchoscopic biopsy and radiation. I discussed pros and cons of both approaches with

## 2021-10-04 NOTE — Assessment & Plan Note (Signed)
I will escalate him to triple therapy with Breztri, sample and prescription was provided today.

## 2021-10-04 NOTE — Patient Instructions (Signed)
°  X Sample & RX of Breztri - 2 puffs twice daily  Try nicotine patches 21mg  daily, If that does not work, call me for nicotrol inhaler

## 2021-10-04 NOTE — Assessment & Plan Note (Signed)
Smoking cessation was again emphasized and better outcomes for people were able to quit smoking with lobectomy

## 2021-10-04 NOTE — Progress Notes (Signed)
° °  Subjective:    Patient ID: James Serrano, male    DOB: 06-Mar-1966, 56 y.o.   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  Unfortunately he was found to have a left upper lobe nodule. We reviewed PET scan today which showed hypermetabolism in this left upper lobe nodule with SUV 10.8, there was some activity of mesenteric vessels also noted He continues to complain of some shortness of breath and a chronic cough  On specific questioning about abdominal pain he reports mild epigastric discomfort which improves after taking Tums and he has attributed to "gas"  Family history of lung cancer in his dad, brother died of sarcoma metastatic to lungs, mom has an unknown kind of cancer and also had asthma.  Additional history also obtained from his wife was present  Significant tests/ events reviewed  LDCT 08/2021 Dominant 19.7 mm central left upper lobe irregular pulmonary nodule PFTs 09/13/2021 severe airway obstruction, ratio 48, FEV1 1.35/34%, FVC 53%, TLC 1 1 3%, DLCO 92%/27.7 , postbronchodilator FEV1 was 1.47   Past Medical History:  Diagnosis Date   Bronchitis    Tobacco use      Review of Systems neg for any significant sore throat, dysphagia, itching, sneezing, nasal congestion or excess/ purulent secretions, fever, chills, sweats, unintended wt loss, pleuritic or exertional cp, hempoptysis, orthopnea pnd or change in chronic leg swelling. Also denies presyncope, palpitations, heartburn, abdominal pain, nausea, vomiting, diarrhea or change in bowel or urinary habits, dysuria,hematuria, rash, arthralgias, visual complaints, headache, numbness weakness or ataxia.      Objective:   Physical Exam  Gen. Pleasant, well-nourished, in no distress, normal affect ENT - no pallor,icterus, no post  nasal drip Neck: No JVD, no thyromegaly, no carotid bruits Lungs: no use of accessory muscles, no dullness to percussion, decreased bilateral without rales or rhonchi  Cardiovascular: Rhythm regular, heart sounds  normal, no murmurs or gallops, no peripheral edema Abdomen: soft and non-tender, no hepatosplenomegaly, BS normal. Musculoskeletal: No deformities, no cyanosis or clubbing Neuro:  alert, non focal       Assessment & Plan:

## 2021-10-10 NOTE — Progress Notes (Signed)
[address]       ,[place] [id number]             [place] Record #[id number] Date of Birth: 1966/02/08  Referring: [name], DO Primary Care: Kathyrn Drown, MD Primary Cardiologist: None  Chief Complaint:    Chief Complaint  Patient presents with   Lung Lesion    Initial surgical consult, PET 2/3, PFT 1/20, CT chest 1/13    History of Present Illness:    James Serrano 56 y.o. male referred for surgical evaluation of the left upper lobe pulmonary nodule that increased uptake on PET scan.  He is previously a very heavy smoker, but has cut down to about half a pack a day.  The pulmonary nodule was originally found on screening CT.  In regards to his symptoms she does admit to some exertional shortness of breath.  He denies any neurologic symptoms.     Zubrod Score: At the time of surgery this patients most appropriate activity status/level should be described as: []     0    Normal activity, no symptoms [x]     1    Restricted in physical strenuous activity but ambulatory, able to do out light work []     2    Ambulatory and capable of self care, unable to do work activities, up and about               >50 % of waking hours                              []     3    Only limited self care, in bed greater than 50% of waking hours []     4    Completely disabled, no self care, confined to bed or chair []     5    Moribund   Past Medical History:  Diagnosis Date   Bronchitis    Tobacco use     Past Surgical History:  Procedure Laterality Date   ORIF ACETABULAR FRACTURE     TONSILLECTOMY      Family History  Problem Relation Age of Onset   Heart attack Father    Cancer Father    Cancer Brother    Cancer Mother      Social History   Tobacco Use  Smoking Status Every Day   Packs/day: 1.00   Years: 30.00   Pack years: 30.00   Types: Cigarettes  Smokeless  Tobacco Never  Tobacco Comments   1 ppd 10/04/2021    Social History   Substance and Sexual Activity  Alcohol Use Yes   Comment: occasionally     No Known Allergies  Current Outpatient Medications  Medication Sig Dispense Refill   albuterol (VENTOLIN HFA) 108 (90 Base) MCG/ACT inhaler Inhale 2 puffs into the lungs every 4 (four) hours as needed. 9 g 12   Budeson-Glycopyrrol-Formoterol (BREZTRI AEROSPHERE) 160-9-4.8 MCG/ACT AERO Inhale 2 puffs into the lungs in the morning and at bedtime. 10.7 g 11   Budeson-Glycopyrrol-Formoterol (BREZTRI AEROSPHERE) 160-9-4.8 MCG/ACT AERO Inhale 2 puffs into the lungs in the morning and at bedtime. 10.7 g 0   clonazePAM (KLONOPIN) 0.5 MG tablet Take 1 tablet (0.5 mg total) by mouth 2 (two)  times daily as needed for anxiety. 20 tablet 0   pantoprazole (PROTONIX) 40 MG tablet Take 1 tablet (40 mg total) by mouth daily. 30 tablet 5   SYMBICORT 80-4.5 MCG/ACT inhaler INHALE 2 PUFFS BY MOUTH TWICE DAILY . APPOINTMENT REQUIRED FOR FUTURE REFILLS 11 g 3   No current facility-administered medications for this visit.    Review of Systems  Constitutional:  Positive for malaise/fatigue. Negative for fever and weight loss.  Respiratory:  Positive for shortness of breath.   Cardiovascular:  Negative for chest pain.    PHYSICAL EXAMINATION: BP 132/89    Pulse 70    Resp 20    Ht 6' (1.829 m)    Wt 244 lb (110.7 kg)    SpO2 98% Comment: RA   BMI 33.09 kg/m  Physical Exam Constitutional:      General: He is not in acute distress.    Appearance: He is obese. He is not ill-appearing.  HENT:     Head: Normocephalic and atraumatic.  Eyes:     Extraocular Movements: Extraocular movements intact.  Cardiovascular:     Rate and Rhythm: Normal rate.     Heart sounds: No murmur heard. Pulmonary:     Effort: Pulmonary effort is normal. No respiratory distress.     Breath sounds: Normal breath sounds.  Abdominal:     General: There is no distension.   Musculoskeletal:        General: Normal range of motion.     Cervical back: Normal range of motion.  Skin:    General: Skin is warm and dry.  Neurological:     General: No focal deficit present.     Mental Status: He is alert and oriented to person, place, and time.    Diagnostic Studies & Laboratory data:     Recent Radiology Findings:   DG Chest 2 View  Result Date: 09/14/2021 CLINICAL DATA:  Cough and congestion for 2 weeks. EXAM: CHEST - 2 VIEW COMPARISON:  08/20/2020 and prior radiographs FINDINGS: The cardiomediastinal silhouette is unremarkable. COPD changes again noted. There is no evidence of focal airspace disease, pulmonary edema, suspicious pulmonary nodule/mass, pleural effusion, or pneumothorax. No acute bony abnormalities are identified. IMPRESSION: COPD without evidence of acute cardiopulmonary disease. Electronically Signed   By: Margarette Canada M.D.   On: 09/14/2021 13:36   NM PET Image Initial (PI) Skull Base To Thigh  Result Date: 09/30/2021 CLINICAL DATA:  Initial treatment strategy for left upper lobe lung nodule. EXAM: NUCLEAR MEDICINE PET SKULL BASE TO THIGH TECHNIQUE: 14.0 mCi F-18 FDG was injected intravenously. Full-ring PET imaging was performed from the skull base to thigh after the radiotracer. CT data was obtained and used for attenuation correction and anatomic localization. Fasting blood glucose: 109 mg/dl COMPARISON:  Chest CT 09/06/2021 FINDINGS: Mediastinal blood pool activity: SUV max 2.4 Liver activity: SUV max NA NECK: No significant abnormal hypermetabolic activity in this region. Incidental CT findings: none CHEST: The left upper lobe infrahilar nodule is blurred by motion artifact but may measure up to 1.1 by 2.0 cm on image 87 of series 4, maximum SUV 10.8 compatible with malignancy. Truncation or occlusion of the inferior lingular bronchus noted with associated atelectasis inferiorly in the lingula. No hypermetabolic adenopathy observed. Incidental CT  findings: Coronary, aortic arch, and branch vessel atherosclerotic vascular disease. Emphysema. ABDOMEN/PELVIS: Accentuated anal activity without a well-defined mass, maximum SUV 7.8. This is most commonly incidental/physiologic. Incidental CT findings: Atherosclerosis is present, including aortoiliac atherosclerotic disease. There  is some upper normal sized loops of proximal small bowel. There is some mild swirling of the central mesenteric vessels for example on image 149 of series 4. No malrotation. Mild prostatomegaly. Suspected small left adrenal adenoma. SKELETON: No significant abnormal hypermetabolic activity in this region. Incidental CT findings: none IMPRESSION: 1. The left upper lobe infrahilar nodule is hypermetabolic with maximum SUV 10.8, compatible with malignancy. This appears to truncated or occlude the inferior lingular bronchus. 2. No findings of distant metastatic spread or hypermetabolic adenopathy. 3. Accentuated anal activity without a well-defined mass, most commonly incidental/physiologic. 4. Mild swirling of mesenteric vessels with some upper normal sized loops of proximal small bowel; the patient is reportedly currently asymptomatic and accordingly this seems unlikely to be an acute volvulus, but if the patient were to develop abdominal symptoms in the short-term then further workup may be warranted. 5. Other imaging findings of potential clinical significance: Aortic Atherosclerosis (ICD10-I70.0) and Emphysema (ICD10-J43.9). Coronary atherosclerosis. Systemic atherosclerosis. Mild prostatomegaly. Suspected small left adrenal adenoma. Electronically Signed   By: Van Clines M.D.   On: 09/30/2021 11:04       I have independently reviewed the above radiology studies  and reviewed the findings with the patient.   Recent Lab Findings: Lab Results  Component Value Date   WBC 8.5 08/20/2020   HGB 17.7 08/20/2020   HCT 52.2 (H) 08/20/2020   PLT 215 08/20/2020   GLUCOSE 104  (H) 03/16/2020   CHOL 186 03/16/2020   TRIG 119 03/16/2020   HDL 53 03/16/2020   LDLCALC 112 (H) 03/16/2020   ALT 20 03/16/2020   AST 18 03/16/2020   NA 139 03/16/2020   K 4.9 03/16/2020   CL 99 03/16/2020   CREATININE 0.94 03/16/2020   BUN 7 03/16/2020   CO2 26 03/16/2020   TSH 1.260 02/12/2014     PFTs:  - FVC: 53% - FEV1: 34% -DLCO: 92%  Problem List: Central left upper lobe nodule measuring 2 cm.  SUV uptake of 10.8. Marginal pulmonary function testing Current smoker    Assessment / Plan:   56 year old male with a 2 cm left upper lobe pulmonary nodule with increased uptake on PET/CT.  He has a very strong smoking history, and his pulmonary functions are marginal.  His DLCO is acceptable but his FEV1 and FVC are 34% and 53% respectively.   He continues to smoke about half a pack of cigarettes a day but is willing to quit.  I think that over the next 2 weeks he should be enrolled in a pulmonary rehab program and have a repeat and his pulmonary function testing.  The other option would be to obtain a navigational bronchoscopy and biopsy while we wait for him to get off cigarettes and improve his lung function testing.  Given the fact that his DLCO is adequate he still is potentially a surgical candidate with the understanding that he may have persistent shortness of breath after the lobectomy if this proves to be a lung cancer.  We also discussed the options for radiation therapy, and the patient states that he would rather undergo resection.      I  spent 40 minutes with  the patient face to face in counseling and coordination of care.    Lajuana Matte 10/11/2021 1:55 PM

## 2021-10-11 ENCOUNTER — Other Ambulatory Visit: Payer: Self-pay

## 2021-10-11 ENCOUNTER — Institutional Professional Consult (permissible substitution) (INDEPENDENT_AMBULATORY_CARE_PROVIDER_SITE_OTHER): Payer: 59 | Admitting: Thoracic Surgery (Cardiothoracic Vascular Surgery)

## 2021-10-11 VITALS — BP 132/89 | HR 70 | Resp 20 | Ht 72.0 in | Wt 244.0 lb

## 2021-10-11 DIAGNOSIS — R911 Solitary pulmonary nodule: Secondary | ICD-10-CM

## 2021-10-12 ENCOUNTER — Encounter: Payer: Self-pay | Admitting: Pulmonary Disease

## 2021-10-14 NOTE — Telephone Encounter (Signed)
"  Dr Elsworth Soho I know we had talked about using the patches to quit smiling am you said if it wasn't working to contact you. I was advised by the surgeon that he wanted me to quit smoking by two weeks, I need help the patches aren't working for me. Other then chantix or Wellbutrin is there anything else prescription or is it best to do the nicotine inhaler. I appreciate your help. Any medication needs to be called in to [facility]. Ashok Cordia"   Dr. Elsworth Soho please advise

## 2021-10-17 NOTE — Telephone Encounter (Signed)
Please place referral to pulm rehab  -plan is for lobectomy for lung cancer

## 2021-10-18 ENCOUNTER — Other Ambulatory Visit: Payer: Self-pay

## 2021-10-18 DIAGNOSIS — J432 Centrilobular emphysema: Secondary | ICD-10-CM

## 2021-10-18 MED ORDER — NICOTINE 10 MG IN INHA: 1.0000 | RESPIRATORY_TRACT | 0 refills | Status: DC | PRN

## 2021-10-21 ENCOUNTER — Encounter: Payer: Self-pay | Admitting: *Deleted

## 2021-10-21 ENCOUNTER — Other Ambulatory Visit: Payer: Self-pay | Admitting: *Deleted

## 2021-10-21 DIAGNOSIS — R911 Solitary pulmonary nodule: Secondary | ICD-10-CM

## 2021-10-22 ENCOUNTER — Other Ambulatory Visit: Payer: Self-pay | Admitting: *Deleted

## 2021-10-22 ENCOUNTER — Telehealth: Payer: Self-pay

## 2021-10-22 ENCOUNTER — Telehealth: Payer: Self-pay | Admitting: Acute Care

## 2021-10-22 NOTE — Telephone Encounter (Signed)
ATC patient per Dr. Elsworth Soho to have patient scheduled to be seen in [facility] office next week. LMTCB

## 2021-10-22 NOTE — Telephone Encounter (Signed)
James Serrano please advise on this message in regards to patient's PET scan

## 2021-10-23 NOTE — Telephone Encounter (Signed)
Called wife back and she states that her issues were taken care of and she has a follow up appt with Dr Elsworth Soho next Friday to discuss the concerns with surgery and rehab for her husband. Told patient that if she had any questions to call office back. Nothing further  ?

## 2021-10-24 ENCOUNTER — Telehealth: Payer: Self-pay | Admitting: Acute Care

## 2021-10-25 ENCOUNTER — Telehealth: Payer: 59 | Admitting: Thoracic Surgery (Cardiothoracic Vascular Surgery)

## 2021-10-25 ENCOUNTER — Other Ambulatory Visit (HOSPITAL_COMMUNITY): Payer: Self-pay

## 2021-10-25 ENCOUNTER — Telehealth: Payer: Self-pay | Admitting: Pharmacy Technician

## 2021-10-25 NOTE — Telephone Encounter (Signed)
Patient Advocate Encounter ? ?Received notification from [name] ([employer]) that prior authorization for NICOTROL 10MG  INHALER is required. ?  ?PA submitted on 3.3.23 ?Key [id number] ?Status is pending ?  ?[facility] will continue to follow ? ?James Serrano R [name], CPhT ?Patient Advocate ?Phone: [phone] ?Fax:  [phone] ? ?

## 2021-10-29 ENCOUNTER — Telehealth: Payer: Self-pay

## 2021-10-29 ENCOUNTER — Other Ambulatory Visit (HOSPITAL_COMMUNITY): Payer: 59

## 2021-10-29 ENCOUNTER — Telehealth: Payer: Self-pay | Admitting: Acute Care

## 2021-10-29 NOTE — Telephone Encounter (Signed)
Called and spoke to pts wife James Serrano ( ok per [facility]) after receiving a mychart message about biopsy and she states she was confused because she thought the office was moving forward with the biopsy and not the surgery but both were cancelled and she had received clarification from scheduling before I called. Nothing further needed.  ?

## 2021-10-29 NOTE — Telephone Encounter (Signed)
Called patients wife and patient biopsy was canceled. Megan from [place] office called and spoke to patient and got a follow up set up with Dr Elsworth Soho. Biopsy will be rescheduled on Friday  ? ?Nothing further  ?

## 2021-10-30 ENCOUNTER — Other Ambulatory Visit (HOSPITAL_COMMUNITY): Payer: Self-pay

## 2021-10-30 ENCOUNTER — Telehealth: Payer: 59 | Admitting: Physician Assistant

## 2021-10-30 ENCOUNTER — Encounter (HOSPITAL_COMMUNITY): Payer: Self-pay

## 2021-10-30 DIAGNOSIS — K0889 Other specified disorders of teeth and supporting structures: Secondary | ICD-10-CM

## 2021-10-30 MED ORDER — AMOXICILLIN 500 MG PO CAPS: 500.0000 mg | ORAL_CAPSULE | Freq: Three times a day (TID) | ORAL | 0 refills | Status: AC

## 2021-10-30 MED ORDER — IBUPROFEN 600 MG PO TABS: 600.0000 mg | ORAL_TABLET | Freq: Three times a day (TID) | ORAL | 0 refills | Status: AC

## 2021-10-30 NOTE — Progress Notes (Signed)
E-Visit for Dental Pain ? ?We are sorry that you are not feeling well.  Here is how we plan to help! ? ?Based on what you have shared with me in the questionnaire, it sounds like you have a dental infection caused by a cavity. ? ?Ibuprofen 600mg  3 times a day for 7 days for discomfort and Amoxicillin 500mg  3 times per day for 10 days ? ?It is imperative that you see a dentist within 10 days of this eVisit to determine the cause of the dental pain and be sure it is adequately treated ? ?A toothache or tooth pain is caused when the nerve in the root of a tooth or surrounding a tooth is irritated. Dental (tooth) infection, decay, injury, or loss of a tooth are the most common causes of dental pain. Pain may also occur after an extraction (tooth is pulled out). Pain sometimes originates from other areas and radiates to the jaw, thus appearing to be tooth pain.Bacteria growing inside your mouth can contribute to gum disease and dental decay, both of which can cause pain. A toothache occurs from inflammation of the central portion of the tooth called pulp. The pulp contains nerve endings that are very sensitive to pain. Inflammation to the pulp or pulpitis may be caused by dental cavities, trauma, and infection.  ? ? ?HOME CARE:  ? ?For toothaches: ?Over-the-counter pain medications such as acetaminophen or ibuprofen may be used. Take these as directed on the package while you arrange for a dental appointment. ?Avoid very cold or hot foods, because they may make the pain worse. ?You may get relief from biting on a cotton ball soaked in oil of cloves. You can get oil of cloves at most drug stores. ? ?For jaw pain: ? Aspirin may be helpful for problems in the joint of the jaw in adults. ?If pain happens every time you open your mouth widely, the temporomandibular joint (TMJ) may be the source of the pain. Yawning or taking a large bite of food may worsen the pain. An appointment with your doctor or dentist will help you  find the cause. ?  ? ? ?GET HELP RIGHT AWAY IF: ? ?You have a high fever or chills ?If you have had a recent head or face injury and develop headache, light headedness, nausea, vomiting, or other symptoms that concern you after an injury to your face or mouth, you could have a more serious injury in addition to your dental injury. ?A facial rash associated with a toothache: This condition may improve with medication. Contact your doctor for them to decide what is appropriate. ?Any jaw pain occurring with chest pain: Although jaw pain is most commonly caused by dental disease, it is sometimes referred pain from other areas. People with heart disease, especially people who have had stents placed, people with diabetes, or those who have had heart surgery may have jaw pain as a symptom of heart attack or angina. If your jaw or tooth pain is associated with lightheadedness, sweating, or shortness of breath, you should see a doctor as soon as possible. ?Trouble swallowing or excessive pain or bleeding from gums: If you have a history of a weakened immune system, diabetes, or steroid use, you may be more susceptible to infections. Infections can often be more severe and extensive or caused by unusual organisms. Dental and gum infections in people with these conditions may require more aggressive treatment. An abscess may need draining or IV antibiotics, for example. ? ?MAKE SURE YOU  ? ?  Understand these instructions. ?Will watch your condition. ?Will get help right away if you are not doing well or get worse. ? ?Thank you for choosing an e-visit. ? ?Your e-visit answers were reviewed by a board certified advanced clinical practitioner to complete your personal care plan. Depending upon the condition, your plan could have included both over the counter or prescription medications. ? ?Please review your pharmacy choice. Make sure the pharmacy is open so you can pick up prescription now. If there is a problem, you may contact  your provider through [employer] and have the prescription routed to another pharmacy.  Your safety is important to [place]. If you have drug allergies check your prescription carefully.  ? ?For the next 24 hours you can use MyChart to ask questions about today's visit, request a non-urgent call back, or ask for a work or school excuse. ?You will get an email in the next two days asking about your experience. I hope that your e-visit has been valuable and will speed your recovery. ? ?Greater than 5 minutes, yet less than 10 minutes of time have been spent researching, coordinating, and implementing care for this patient today ? ?

## 2021-10-30 NOTE — Telephone Encounter (Signed)
Patient Advocate Encounter ? ?Prior Authorization for Nicotrol 10mg  inhalers has been approved.   ? ?PA# [id number] ? ?Effective dates: 10/25/21 through 10/26/22 ? ?Refill too soon ? ?Patient Advocate ?Fax: [phone]  ?

## 2021-10-31 ENCOUNTER — Encounter (HOSPITAL_COMMUNITY): Admission: RE | Payer: Self-pay | Source: Home / Self Care

## 2021-10-31 ENCOUNTER — Ambulatory Visit (HOSPITAL_COMMUNITY): Admission: RE | Admit: 2021-10-31 | Payer: 59 | Source: Home / Self Care | Admitting: Pulmonary Disease

## 2021-10-31 SURGERY — VIDEO BRONCHOSCOPY WITH ENDOBRONCHIAL NAVIGATION
Anesthesia: General

## 2021-10-31 SURGERY — WEDGE RESECTION, LUNG, ROBOT-ASSISTED, THORACOSCOPIC
Anesthesia: General | Site: Chest | Laterality: Left

## 2021-10-31 SURGERY — BRONCHOSCOPY, WITH BIOPSY USING ELECTROMAGNETIC NAVIGATION
Anesthesia: General

## 2021-11-01 ENCOUNTER — Other Ambulatory Visit: Payer: Self-pay

## 2021-11-01 ENCOUNTER — Encounter: Payer: Self-pay | Admitting: Pulmonary Disease

## 2021-11-01 ENCOUNTER — Ambulatory Visit (INDEPENDENT_AMBULATORY_CARE_PROVIDER_SITE_OTHER): Payer: 59 | Admitting: Pulmonary Disease

## 2021-11-01 ENCOUNTER — Telehealth: Payer: Self-pay | Admitting: Pulmonary Disease

## 2021-11-01 DIAGNOSIS — J432 Centrilobular emphysema: Secondary | ICD-10-CM

## 2021-11-01 DIAGNOSIS — R911 Solitary pulmonary nodule: Secondary | ICD-10-CM

## 2021-11-01 DIAGNOSIS — Z720 Tobacco use: Secondary | ICD-10-CM

## 2021-11-01 NOTE — Assessment & Plan Note (Signed)
Prescription has been sent for [facility] he will stay at started on this and use albuterol for breakthrough ?Pulmonary rehab referral has been placed ?

## 2021-11-01 NOTE — Patient Instructions (Signed)
?  X Proceed with biopsy ? ? Radiation oncologist consultation afterwards ? ? congratulations on cutting down smoking ? ?Continue on Breztri ?Pulmonary rehab ? ?

## 2021-11-01 NOTE — Progress Notes (Signed)
? ?  Subjective:  ? ? Patient ID: James Serrano, male    DOB: 1965/10/10, 56 y.o.   MRN: [id number] ? ?HPI ? 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  ?He smokes 2 to 3 packs/day starting at age 78, more than 24 pack years ?LDCT screening showed  a left upper lobe nodule. ?PET  showed hypermetabolism in this left upper lobe nodule with SUV 10.8, there was some activity of mesenteric vessels also noted ? ?Family history of lung cancer in his dad, brother died of sarcoma metastatic to lungs, mom has an unknown kind of cancer and also had asthma. ? ?We referred him to Dr. Kipp Brood from cardiothoracic surgery, he discussed complications of surgery and advised smoking cessation and pulmonary rehab prior to surgery.  Hayze feels he does not want  to take the risk of being on oxygen.  He prefers radiation treatment instead. ? ?He has been able to cut down smoking to half pack per day, tried nicotine patches but this made him throw up.  Tried Chantix for little bit ? ?He has been able to fill a prescription for Breztri instead of Symbicort.  We placed a referral for pulmonary rehab and it may take him a month to enroll ? ?Wife present and corroborates history ? ? ?Significant tests/ events reviewed ?LDCT 08/2021 Dominant 19.7 mm central left upper lobe irregular pulmonary nodule ? ?PFTs 09/13/2021 severe airway obstruction, ratio 48, FEV1 1.35/34%, FVC 53%, TLC 1 1 3%, DLCO 92%/27.7 , postbronchodilator FEV1 was 1.47 ? ? ?Review of Systems ?neg for any significant sore throat, dysphagia, itching, sneezing, nasal congestion or excess/ purulent secretions, fever, chills, sweats, unintended wt loss, pleuritic or exertional cp, hempoptysis, orthopnea pnd or change in chronic leg swelling. Also denies presyncope, palpitations, heartburn, abdominal pain, nausea, vomiting, diarrhea or change in bowel or urinary habits, dysuria,hematuria, rash, arthralgias, visual complaints, headache,  numbness weakness or ataxia. ? ?   ?Objective:  ? Physical Exam ? ?Gen. Pleasant, well-nourished, in no distress ?ENT - no thrush, no pallor/icterus,no post nasal drip ?Neck: No JVD, no thyromegaly, no carotid bruits ?Lungs: no use of accessory muscles, no dullness to percussion, decreased without rales or rhonchi  ?Cardiovascular: Rhythm regular, heart sounds  normal, no murmurs or gallops, no peripheral edema ?Musculoskeletal: No deformities, no cyanosis or clubbing  ? ? ? ?   ?Assessment & Plan:  ? ? ?

## 2021-11-01 NOTE — Assessment & Plan Note (Signed)
This is likely stage I lung cancer.  We again had a discussion and I gave him comparison outcomes between lobectomy and radiation treatment for stage I lung cancer.  His lung function is borderline and there is some risk of him landing up on oxygen.  He absolutely does not want to take that risk.  I offered him a second opinion but he does not want to go through surgery at all.  He prefers radiation treatment.  I explained to him that he will need a biopsy before going in that direction.  I discussed risks and benefits of bronchoscopy with biopsy procedure including that of navigation procedure if needed he is willing to proceed. ?

## 2021-11-01 NOTE — H&P (View-Only) (Signed)
? ?  Subjective:  ? ? Patient ID: James Serrano, male    DOB: 1966-06-02, 56 y.o.   MRN: [id number] ? ?HPI ? 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  ?He smokes 2 to 3 packs/day starting at age 56, more than 22 pack years ?LDCT screening showed  a left upper lobe nodule. ?PET  showed hypermetabolism in this left upper lobe nodule with SUV 10.8, there was some activity of mesenteric vessels also noted ? ?Family history of lung cancer in his dad, brother died of sarcoma metastatic to lungs, mom has an unknown kind of cancer and also had asthma. ? ?We referred him to Dr. Kipp Brood from cardiothoracic surgery, he discussed complications of surgery and advised smoking cessation and pulmonary rehab prior to surgery.  James Serrano feels he does not want  to take the risk of being on oxygen.  He prefers radiation treatment instead. ? ?He has been able to cut down smoking to half pack per day, tried nicotine patches but this made him throw up.  Tried Chantix for little bit ? ?He has been able to fill a prescription for Breztri instead of Symbicort.  We placed a referral for pulmonary rehab and it may take him a month to enroll ? ?Wife present and corroborates history ? ? ?Significant tests/ events reviewed ?LDCT 08/2021 Dominant 19.7 mm central left upper lobe irregular pulmonary nodule ? ?PFTs 09/13/2021 severe airway obstruction, ratio 48, FEV1 1.35/34%, FVC 53%, TLC 1 1 3%, DLCO 92%/27.7 , postbronchodilator FEV1 was 1.47 ? ? ?Review of Systems ?neg for any significant sore throat, dysphagia, itching, sneezing, nasal congestion or excess/ purulent secretions, fever, chills, sweats, unintended wt loss, pleuritic or exertional cp, hempoptysis, orthopnea pnd or change in chronic leg swelling. Also denies presyncope, palpitations, heartburn, abdominal pain, nausea, vomiting, diarrhea or change in bowel or urinary habits, dysuria,hematuria, rash, arthralgias, visual complaints, headache,  numbness weakness or ataxia. ? ?   ?Objective:  ? Physical Exam ? ?Gen. Pleasant, well-nourished, in no distress ?ENT - no thrush, no pallor/icterus,no post nasal drip ?Neck: No JVD, no thyromegaly, no carotid bruits ?Lungs: no use of accessory muscles, no dullness to percussion, decreased without rales or rhonchi  ?Cardiovascular: Rhythm regular, heart sounds  normal, no murmurs or gallops, no peripheral edema ?Musculoskeletal: No deformities, no cyanosis or clubbing  ? ? ? ?   ?Assessment & Plan:  ? ? ?

## 2021-11-01 NOTE — Assessment & Plan Note (Signed)
Smoking cessation was again discussed. ?He will continue on Chantix.  He was not willing to set a quit date but will continue to cut down his smoking ?

## 2021-11-01 NOTE — Telephone Encounter (Signed)
PCCM: ? ?Contacted by Dr. Elsworth Soho.  We will plan for outpatient bronchoscopy. ?Please see orders placed. ?Hopefully patient can do this on 11/12/2021. ? ?Alternate date would be 11/19/2021 if patient is unable to do the 21st. ? ?[name], DO ?[facility] ?11/01/2021 11:17 AM   ? ?

## 2021-11-03 ENCOUNTER — Encounter: Payer: Self-pay | Admitting: Family Medicine

## 2021-11-06 ENCOUNTER — Telehealth: Payer: Self-pay | Admitting: Pulmonary Disease

## 2021-11-06 DIAGNOSIS — R911 Solitary pulmonary nodule: Secondary | ICD-10-CM

## 2021-11-06 NOTE — Telephone Encounter (Signed)
PCCM: ? ?Orders placed for super D CT. ?New CT scan will need to be complete prior to bronchoscopy which has been scheduled already. ? ?[name], DO ? Pulmonary Critical Care ?11/06/2021 4:01 PM   ?

## 2021-11-06 NOTE — Telephone Encounter (Signed)
-----   Message from Darral Dash, RN sent at 11/04/2021 10:02 AM EDT ----- ?Regarding: pt needs new CT ?Hey, Mr Domangue needs a new CT scan for his procedure.  Since it was a low dose CT, there aren't any thins.  Thanks! ? ?Larene Beach  ? ?

## 2021-11-08 ENCOUNTER — Encounter (HOSPITAL_COMMUNITY): Payer: Self-pay

## 2021-11-08 ENCOUNTER — Other Ambulatory Visit: Payer: Self-pay

## 2021-11-08 ENCOUNTER — Encounter (HOSPITAL_COMMUNITY): Payer: Self-pay | Admitting: Pulmonary Disease

## 2021-11-08 ENCOUNTER — Ambulatory Visit (INDEPENDENT_AMBULATORY_CARE_PROVIDER_SITE_OTHER): Payer: 59 | Admitting: Thoracic Surgery (Cardiothoracic Vascular Surgery)

## 2021-11-08 ENCOUNTER — Ambulatory Visit (HOSPITAL_COMMUNITY)
Admission: RE | Admit: 2021-11-08 | Discharge: 2021-11-08 | Disposition: A | Discharge status: Home / Self Care | Payer: 59 | Source: Ambulatory Visit | Attending: Pulmonary Disease | Admitting: Pulmonary Disease

## 2021-11-08 DIAGNOSIS — R911 Solitary pulmonary nodule: Secondary | ICD-10-CM | POA: Diagnosis present

## 2021-11-08 NOTE — Progress Notes (Signed)
?   ?  [address] ?      [place] [address] ?            [phone]      ? ?Patient: Home ?Provider: Office ?Consent for Telemedicine visit obtained. ? ?Today?s visit was completed via a real-time telehealth (see specific modality noted below). The patient/authorized person provided oral consent at the time of the visit to engage in a telemedicine encounter with the present provider at [facility]. The patient/authorized person was informed of the potential benefits, limitations, and risks of telemedicine. The patient/authorized person expressed understanding that the laws that protect confidentiality also apply to telemedicine. The patient/authorized person acknowledged understanding that telemedicine does not provide emergency services and that he or she would need to call 911 or proceed to the nearest hospital for help if such a need arose. ? ? Total time spent in the clinical discussion 10 minutes. ? Telehealth Modality: Phone visit (audio only) ? ?I had a telephone visit with James Serrano.  He is scheduled to undergo a navigational bronchoscopy and biopsy with Dr. Valeta Harms.  He remains hesitant about proceeding with surgery at this point.  We will we discussed surgical plans once the biopsy results are back. ? ? ? ?

## 2021-11-11 ENCOUNTER — Encounter (HOSPITAL_COMMUNITY): Payer: Self-pay | Admitting: Pulmonary Disease

## 2021-11-11 ENCOUNTER — Other Ambulatory Visit: Payer: Self-pay

## 2021-11-11 NOTE — Pre-Procedure Instructions (Signed)
?[place], [place] - [address] ?[address] ?[place] [address] ?Phone: [phone] Fax: [phone] ? ?[place], [place] [address] ?[address] ?[place] [address] ?Phone: [phone] Fax: [phone] ? ?[place] [address]ni [place], [place] [address] ?[place] [address] ?Phone: [phone] Fax: [phone] ? ?PCP - Kathyrn Drown, MD ? ?Chest x-ray - 09/14/21 ?Stress Test - 02/13/14 ? ?ERAS Protcol - Clears until 0800 ? ?COVID TEST- DOS ? ?Anesthesia review: N ? ?Patient verbally denies any shortness of breath, fever, cough and chest pain during phone call ? ? ?-------------  SDW INSTRUCTIONS given: ? ?Your procedure is scheduled on 11/12/21. ? Report to [facility] Main Entrance "A" at 0800 A.M., and check in at the Admitting office. ? Call this number if you have problems the morning of surgery: ? [phone] ? ? Remember: ? Do not eat after midnight the night before your surgery ? ?You may drink clear liquids until 0800 the morning of your surgery.   ?Clear liquids allowed are: Water, Non-Citrus Juices (without pulp), Carbonated Beverages, Clear Tea, Black Coffee Only, and Gatorade ?  ? Take these medicines the morning of surgery with A SIP OF WATER  ?Budeson-Glycopyrrol-Formoterol (BREZTRI AEROSPHERE)  ?clonazePAM (KLONOPIN)-if needed ?albuterol (VENTOLIN HFA)-if needed (please bring with you on day of surgery) ? ?As of today, STOP taking any Aspirin (unless otherwise instructed by your surgeon) Aleve, Naproxen, Ibuprofen, Motrin, Advil, Goody's, BC's, all herbal medications, fish oil, and all vitamins. ? ?         ?           Do not wear jewelry, make up, or nail polish ?           Do not wear lotions, powders, perfumes/colognes, or deodorant. ?           Do not shave 48 hours prior to surgery.  Men may shave face and neck. ?           Do not bring  valuables to the hospital. ?           [facility] is not responsible for any belongings or valuables. ? ?Do NOT Smoke (Tobacco/Vaping) 24 hours prior to your procedure ?If you use a CPAP at night, you may bring all equipment for your overnight stay. ?  ?Contacts, glasses, dentures or bridgework may not be worn into surgery.    ?  ?For patients admitted to the hospital, discharge time will be determined by your treatment team. ?  ?Patients discharged the day of surgery will not be allowed to drive home, and someone needs to stay with them for 24 hours. ? ? ? ?Special instructions:   ?[facility]- Preparing For Surgery ? ?Before surgery, you can play an important role. Because skin is not sterile, your skin needs to be as free of germs as possible. You can reduce the number of germs on your skin by washing with CHG (chlorahexidine gluconate) Soap before surgery.  CHG is an antiseptic cleaner which kills germs and bonds with the skin to continue killing germs even after washing.   ? ?Oral Hygiene is also important to reduce your risk of infection.  Remember - BRUSH YOUR TEETH THE MORNING OF SURGERY WITH YOUR REGULAR TOOTHPASTE ? ?Please do not use if you have an allergy to CHG or antibacterial soaps. If your  skin becomes reddened/irritated stop using the CHG.  ?Do not shave (including legs and underarms) for at least 48 hours prior to first CHG shower. It is OK to shave your face. ? ?Please follow these instructions carefully. ?  ?Shower the NIGHT BEFORE SURGERY and the MORNING OF SURGERY with DIAL Soap.  ? ?Pat yourself dry with a CLEAN TOWEL. ? ?Wear CLEAN PAJAMAS to bed the night before surgery ? ?Place CLEAN SHEETS on your bed the night of your first shower and DO NOT SLEEP WITH PETS. ? ? ?Day of Surgery: ?Please shower morning of surgery  ?Wear Clean/Comfortable clothing the morning of surgery ?Do not apply any deodorants/lotions.   ?Remember to brush your teeth WITH YOUR REGULAR TOOTHPASTE. ?  ?Questions were  answered. Patient verbalized understanding of instructions.  ? ? ?   ? ?  ?

## 2021-11-12 ENCOUNTER — Ambulatory Visit (HOSPITAL_COMMUNITY)
Admission: RE | Admit: 2021-11-12 | Discharge: 2021-11-12 | Disposition: A | Discharge status: Home / Self Care | Payer: 59 | Attending: Pulmonary Disease | Admitting: Pulmonary Disease

## 2021-11-12 ENCOUNTER — Ambulatory Visit (HOSPITAL_COMMUNITY): Payer: 59

## 2021-11-12 ENCOUNTER — Ambulatory Visit (HOSPITAL_COMMUNITY): Payer: 59 | Admitting: Anesthesiology

## 2021-11-12 ENCOUNTER — Encounter (HOSPITAL_COMMUNITY): Payer: Self-pay | Admitting: Pulmonary Disease

## 2021-11-12 ENCOUNTER — Encounter (HOSPITAL_COMMUNITY)
Admission: RE | Disposition: A | Discharge status: Home / Self Care | Payer: Self-pay | Source: Home / Self Care | Attending: Pulmonary Disease

## 2021-11-12 ENCOUNTER — Ambulatory Visit (HOSPITAL_BASED_OUTPATIENT_CLINIC_OR_DEPARTMENT_OTHER): Payer: 59 | Admitting: Anesthesiology

## 2021-11-12 DIAGNOSIS — F17210 Nicotine dependence, cigarettes, uncomplicated: Secondary | ICD-10-CM | POA: Insufficient documentation

## 2021-11-12 DIAGNOSIS — Z20822 Contact with and (suspected) exposure to covid-19: Secondary | ICD-10-CM | POA: Insufficient documentation

## 2021-11-12 DIAGNOSIS — Z801 Family history of malignant neoplasm of trachea, bronchus and lung: Secondary | ICD-10-CM | POA: Diagnosis not present

## 2021-11-12 DIAGNOSIS — C3490 Malignant neoplasm of unspecified part of unspecified bronchus or lung: Secondary | ICD-10-CM | POA: Insufficient documentation

## 2021-11-12 DIAGNOSIS — Z809 Family history of malignant neoplasm, unspecified: Secondary | ICD-10-CM | POA: Insufficient documentation

## 2021-11-12 DIAGNOSIS — R911 Solitary pulmonary nodule: Secondary | ICD-10-CM

## 2021-11-12 DIAGNOSIS — C3431 Malignant neoplasm of lower lobe, right bronchus or lung: Secondary | ICD-10-CM | POA: Insufficient documentation

## 2021-11-12 DIAGNOSIS — J449 Chronic obstructive pulmonary disease, unspecified: Secondary | ICD-10-CM | POA: Diagnosis not present

## 2021-11-12 HISTORY — DX: Gastro-esophageal reflux disease without esophagitis: K21.9

## 2021-11-12 HISTORY — PX: BRONCHIAL BRUSHINGS: SHX5108

## 2021-11-12 HISTORY — DX: Anxiety disorder, unspecified: F41.9

## 2021-11-12 HISTORY — DX: Chronic obstructive pulmonary disease, unspecified: J44.9

## 2021-11-12 HISTORY — DX: Unspecified asthma, uncomplicated: J45.909

## 2021-11-12 HISTORY — PX: BRONCHIAL NEEDLE ASPIRATION BIOPSY: SHX5106

## 2021-11-12 HISTORY — PX: BRONCHIAL BIOPSY: SHX5109

## 2021-11-12 LAB — CBC
HCT: 47.7 % (ref 39.0–52.0)
Hemoglobin: 16.4 g/dL (ref 13.0–17.0)
MCH: 34.1 pg — ABNORMAL HIGH (ref 26.0–34.0)
MCHC: 34.4 g/dL (ref 30.0–36.0)
MCV: 99.2 fL (ref 80.0–100.0)
Platelets: 192 10*3/uL (ref 150–400)
RBC: 4.81 MIL/uL (ref 4.22–5.81)
RDW: 12.7 % (ref 11.5–15.5)
WBC: 4.5 10*3/uL (ref 4.0–10.5)
nRBC: 0 % (ref 0.0–0.2)

## 2021-11-12 LAB — SARS CORONAVIRUS 2 BY RT PCR (HOSPITAL ORDER, PERFORMED IN ~~LOC~~ HOSPITAL LAB): SARS Coronavirus 2: NEGATIVE

## 2021-11-12 SURGERY — BRONCHOSCOPY, WITH BIOPSY USING ELECTROMAGNETIC NAVIGATION
Anesthesia: General | Laterality: Left

## 2021-11-12 MED ORDER — FENTANYL CITRATE (PF) 100 MCG/2ML IJ SOLN
INTRAMUSCULAR | Status: DC | PRN
  Administered 2021-11-12: 100 ug via INTRAVENOUS

## 2021-11-12 MED ORDER — OXYCODONE HCL 5 MG PO TABS: 5.0000 mg | ORAL_TABLET | Freq: Once | ORAL | Status: DC | PRN

## 2021-11-12 MED ORDER — FENTANYL CITRATE (PF) 100 MCG/2ML IJ SOLN: 25.0000 ug | INTRAMUSCULAR | Status: DC | PRN

## 2021-11-12 MED ORDER — LACTATED RINGERS IV SOLN: INTRAVENOUS | Status: DC

## 2021-11-12 MED ORDER — ROCURONIUM BROMIDE 10 MG/ML (PF) SYRINGE
PREFILLED_SYRINGE | INTRAVENOUS | Status: DC | PRN
  Administered 2021-11-12: 60 mg via INTRAVENOUS

## 2021-11-12 MED ORDER — LIDOCAINE 2% (20 MG/ML) 5 ML SYRINGE
INTRAMUSCULAR | Status: DC | PRN
  Administered 2021-11-12: 60 mg via INTRAVENOUS

## 2021-11-12 MED ORDER — DEXAMETHASONE SODIUM PHOSPHATE 4 MG/ML IJ SOLN
INTRAMUSCULAR | Status: DC | PRN
Start: 2021-11-12 — End: 2021-11-12
  Administered 2021-11-12: 5 mg via INTRAVENOUS

## 2021-11-12 MED ORDER — CHLORHEXIDINE GLUCONATE 0.12 % MT SOLN
15.0000 mL | OROMUCOSAL | Status: AC
  Filled 2021-11-12: qty 15

## 2021-11-12 MED ORDER — AMISULPRIDE (ANTIEMETIC) 5 MG/2ML IV SOLN: 10.0000 mg | Freq: Once | INTRAVENOUS | Status: DC | PRN

## 2021-11-12 MED ORDER — PROPOFOL 10 MG/ML IV BOLUS
INTRAVENOUS | Status: DC | PRN
  Administered 2021-11-12: 200 mg via INTRAVENOUS
  Administered 2021-11-12: 50 mg via INTRAVENOUS

## 2021-11-12 MED ORDER — SUGAMMADEX SODIUM 200 MG/2ML IV SOLN
INTRAVENOUS | Status: DC | PRN
  Administered 2021-11-12: 200 mg via INTRAVENOUS

## 2021-11-12 MED ORDER — ONDANSETRON HCL 4 MG/2ML IJ SOLN
INTRAMUSCULAR | Status: DC | PRN
  Administered 2021-11-12: 4 mg via INTRAVENOUS

## 2021-11-12 MED ORDER — MIDAZOLAM HCL 5 MG/5ML IJ SOLN
INTRAMUSCULAR | Status: DC | PRN
  Administered 2021-11-12: 2 mg via INTRAVENOUS

## 2021-11-12 MED ORDER — ACETAMINOPHEN 500 MG PO TABS
1000.0000 mg | ORAL_TABLET | Freq: Once | ORAL | Status: AC
  Administered 2021-11-12: 1000 mg via ORAL
  Filled 2021-11-12: qty 2

## 2021-11-12 MED ORDER — OXYCODONE HCL 5 MG/5ML PO SOLN: 5.0000 mg | Freq: Once | ORAL | Status: DC | PRN

## 2021-11-12 MED ORDER — CHLORHEXIDINE GLUCONATE 0.12 % MT SOLN
OROMUCOSAL | Status: AC
  Administered 2021-11-12: 15 mL
  Filled 2021-11-12: qty 15

## 2021-11-12 MED ORDER — PROPOFOL 500 MG/50ML IV EMUL
INTRAVENOUS | Status: DC | PRN
  Administered 2021-11-12: 125 ug/kg/min via INTRAVENOUS

## 2021-11-12 MED ORDER — ONDANSETRON HCL 4 MG/2ML IJ SOLN: 4.0000 mg | Freq: Once | INTRAMUSCULAR | Status: DC | PRN

## 2021-11-12 NOTE — Interval H&P Note (Signed)
History and Physical Interval Note: ? ?11/12/2021 ?9:50 AM ? ?James Serrano  has presented today for surgery, with the diagnosis of lung nodule.  The various methods of treatment have been discussed with the patient and family. After consideration of risks, benefits and other options for treatment, the patient has consented to  Procedure(s) with comments: ?ROBOTIC ASSISTED NAVIGATIONAL BRONCHOSCOPY (Left) - ION w/ CIOS as a surgical intervention.  The patient's history has been reviewed, patient examined, no change in status, stable for surgery.  I have reviewed the patient's chart and labs.  Questions were answered to the patient's satisfaction.   ? ? ?Octavio Graves James Serrano ? ? ?

## 2021-11-12 NOTE — Anesthesia Preprocedure Evaluation (Addendum)
Anesthesia Evaluation  ?Patient identified by MRN, date of birth, ID band ?Patient awake ? ? ? ?Reviewed: ?Allergy & Precautions, NPO status , Patient's Chart, lab work & pertinent test results ? ?Airway ?Mallampati: II ? ?TM Distance: >3 FB ?Neck ROM: Full ? ? ? Dental ? ?(+) Poor Dentition, Chipped ?Multiple broken decayed teeth and most teeth in poor repair:   ?Pulmonary ?asthma , COPD (uses albuterol q6hrs),  COPD inhaler, Current Smoker and Patient abstained from smoking.,  ?  ?Pulmonary exam normal ?breath sounds clear to auscultation ? ? ? ? ? ? Cardiovascular ?negative cardio ROS ?Normal cardiovascular exam ?Rhythm:Regular Rate:Normal ? ? ?  ?Neuro/Psych ?Anxiety negative neurological ROS ? negative psych ROS  ? GI/Hepatic ?Neg liver ROS, GERD  ,  ?Endo/Other  ?negative endocrine ROS ? Renal/GU ?negative Renal ROS  ?negative genitourinary ?  ?Musculoskeletal ?negative musculoskeletal ROS ?(+)  ? Abdominal ?  ?Peds ?negative pediatric ROS ?(+)  Hematology ?negative hematology ROS ?(+)   ?Anesthesia Other Findings ? ? Reproductive/Obstetrics ?negative OB ROS ? ?  ? ? ? ? ? ? ? ? ? ? ? ? ? ?  ?  ? ? ? ? ? ? ?Anesthesia Physical ?Anesthesia Plan ? ?ASA: 2 ? ?Anesthesia Plan: General  ? ?Post-op Pain Management: Tylenol PO (pre-op)*  ? ?Induction: Intravenous ? ?PONV Risk Score and Plan: 1 and Treatment may vary due to age or medical condition, Midazolam, Ondansetron and Dexamethasone ? ?Airway Management Planned: Oral ETT ? ?Additional Equipment:  ? ?Intra-op Plan:  ? ?Post-operative Plan: Extubation in OR ? ?Informed Consent: I have reviewed the patients History and Physical, chart, labs and discussed the procedure including the risks, benefits and alternatives for the proposed anesthesia with the patient or authorized representative who has indicated his/her understanding and acceptance.  ? ? ? ?Dental advisory given ? ?Plan Discussed with: CRNA, Anesthesiologist and  Surgeon ? ?Anesthesia Plan Comments:   ? ? ? ? ? ?Anesthesia Quick Evaluation ? ?

## 2021-11-12 NOTE — Anesthesia Postprocedure Evaluation (Signed)
Anesthesia Post Note ? ?Patient: James Serrano ? ?Procedure(s) Performed: ROBOTIC ASSISTED NAVIGATIONAL BRONCHOSCOPY (Left) ?BRONCHIAL BIOPSIES ?BRONCHIAL NEEDLE ASPIRATION BIOPSIES ?BRONCHIAL BRUSHINGS ? ?  ? ?Patient location during evaluation: PACU ?Anesthesia Type: General ?Level of consciousness: awake ?Pain management: pain level controlled ?Vital Signs Assessment: post-procedure vital signs reviewed and stable ?Respiratory status: spontaneous breathing and respiratory function stable ?Cardiovascular status: stable ?Postop Assessment: no apparent nausea or vomiting ?Anesthetic complications: no ? ? ?No notable events documented. ? ?Last Vitals:  ?Vitals:  ? 11/12/21 1110 11/12/21 1125  ?BP: 128/84 125/84  ?Pulse: 78 74  ?Resp: 18 17  ?Temp:  36.9 ?C  ?SpO2: 93% 95%  ?  ?Last Pain:  ?Vitals:  ? 11/12/21 1125  ?TempSrc:   ?PainSc: 2   ? ? ?  ?  ?  ?  ?  ?  ? ?Merlinda Frederick ? ? ? ? ?

## 2021-11-12 NOTE — Discharge Instructions (Signed)
Flexible Bronchoscopy, Care After ?This sheet gives you information about how to care for yourself after your test. Your doctor may also give you more specific instructions. If you have problems or questions, contact your doctor. ?Follow these instructions at home: ?Eating and drinking ?Do not eat or drink anything (not even water) for 2 hours after your test, or until your numbing medicine (local anesthetic) wears off. ?When your numbness is gone and your cough and gag reflexes have come back, you may: ?Eat only soft foods. ?Slowly drink liquids. ?The day after the test, go back to your normal diet. ?Driving ?Do not drive for 24 hours if you were given a medicine to help you relax (sedative). ?Do not drive or use heavy machinery while taking prescription pain medicine. ?General instructions ? ?Take over-the-counter and prescription medicines only as told by your doctor. ?Return to your normal activities as told. Ask what activities are safe for you. ?Do not use any products that have nicotine or tobacco in them. This includes cigarettes and e-cigarettes. If you need help quitting, ask your doctor. ?Keep all follow-up visits as told by your doctor. This is important. It is very important if you had a tissue sample (biopsy) taken. ?Get help right away if: ?You have shortness of breath that gets worse. ?You get light-headed. ?You feel like you are going to pass out (faint). ?You have chest pain. ?You cough up: ?More than a little blood. ?More blood than before. ?Summary ?Do not eat or drink anything (not even water) for 2 hours after your test, or until your numbing medicine wears off. ?Do not use cigarettes. Do not use e-cigarettes. ?Get help right away if you have chest pain. ? ?This information is not intended to replace advice given to you by your health care provider. Make sure you discuss any questions you have with your health care provider. ?Document Released: 06/08/2009 Document Revised: 07/24/2017 Document  Reviewed: 08/29/2016 ?Elsevier Patient Education ? [facility]. ? ?

## 2021-11-12 NOTE — Op Note (Signed)
Video Bronchoscopy with Robotic Assisted Bronchoscopic Navigation  ? ?Date of Operation: 11/12/2021  ? ?Pre-op Diagnosis: Left upper lobe lung nodule ? 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 ?Surgeon: [name], DO ? ?Assistants: None ? ?Anesthesia: General endotracheal anesthesia ? ?Operation: Flexible video fiberoptic bronchoscopy with robotic assistance and biopsies. ? ?Estimated Blood Loss: Minimal ? ?Complications: None ? ?Indications and History: ?James Serrano is a 56 y.o. male with history of lung nodule. The risks, benefits, complications, treatment options and expected outcomes were discussed with the patient.  The possibilities of pneumothorax, pneumonia, reaction to medication, pulmonary aspiration, perforation of a viscus, bleeding, failure to diagnose a condition and creating a complication requiring transfusion or operation were discussed with the patient who freely signed the consent.   ? ?Description of Procedure: ?The patient was seen in the Preoperative Area, was examined and was deemed appropriate to proceed.  The patient was taken to [facility] endoscopy room 3, identified as James Serrano and the procedure verified as Flexible Video Fiberoptic Bronchoscopy.  A Time Out was held and the above information confirmed.  ? ?Prior to the date of the procedure a high-resolution CT scan of the chest was performed. Utilizing ION software program a virtual tracheobronchial tree was generated to allow the creation of distinct navigation pathways to the patient's parenchymal abnormalities. After being taken to the operating room general anesthesia was initiated and the patient  was orally intubated. The video fiberoptic bronchoscope was introduced via the endotracheal tube and a general inspection was performed which showed normal right and left lung anatomy, aspiration of the bilateral mainstems was completed to remove any remaining  secretions. Robotic catheter inserted into patient's endotracheal tube.  ? ?Target #1 left upper lobe: ?The distinct navigation pathways prepared prior to this procedure were then utilized to navigate to patient's lesion identified on CT scan. The robotic catheter was secured into place and the vision probe was withdrawn.  Lesion location was approximated using fluoroscopy and radial endobronchial ultrasound for peripheral targeting. Under fluoroscopic guidance transbronchial needle brushings, transbronchial needle biopsies, and transbronchial forceps biopsies were performed to be sent for cytology and pathology.  ? ?Target #2 right lower lobe endobronchial: ?Using standard therapeutic bronchoscope we used a cytology brush to obtain specimens from the irregular mucosa of the right lower lobe opening.  We then used 2.8 mm [employer] forceps to obtain endobronchial biopsies of the right lower lobe lesion. ? ?At the end of the procedure a general airway inspection was performed and there was no evidence of active bleeding. The bronchoscope was removed.  The patient tolerated the procedure well. There was no significant blood loss and there were no obvious complications. A post-procedural chest x-ray is pending. ? ?Samples Target #1: ?1. Transbronchial needle brushings from left upper lobe ?2. Transbronchial Wang needle biopsies from left upper lobe ?3. Transbronchial forceps biopsies from left upper lobe ? ?Samples Target #2: ?1. Transbronchial needle brushings from right lower lobe ?2. Endobronchial biopsies from right lower lobe ? ?Plans:  ?The patient will be discharged from the PACU to home when recovered from anesthesia and after chest x-ray is reviewed. We will review the cytology, pathology results with the patient when they become available. Outpatient followup will be with [facility].  ? ?[name], DO ? Pulmonary Critical Care ?11/12/2021 11:28 AM   ? ? ?

## 2021-11-12 NOTE — Anesthesia Procedure Notes (Signed)
Procedure Name: Intubation ?Date/Time: 11/12/2021 10:06 AM ?Performed by: Lieutenant Diego, CRNA ?Pre-anesthesia Checklist: Patient identified, Emergency Drugs available, Suction available and Patient being monitored ?Patient Re-evaluated:Patient Re-evaluated prior to induction ?Oxygen Delivery Method: Circle system utilized ?Preoxygenation: Pre-oxygenation with 100% oxygen ?Induction Type: IV induction ?Ventilation: Mask ventilation without difficulty ?Laryngoscope Size: Sabra Heck and 2 ?Grade View: Grade I ?Tube type: Oral ?Tube size: 8.5 mm ?Number of attempts: 1 ?Airway Equipment and Method: Stylet ?Placement Confirmation: ETT inserted through vocal cords under direct vision, positive ETCO2 and breath sounds checked- equal and bilateral ?Secured at: 23 cm ?Tube secured with: Tape ?Dental Injury: Teeth and Oropharynx as per pre-operative assessment  ? ? ? ? ?

## 2021-11-12 NOTE — Transfer of Care (Signed)
Immediate Anesthesia Transfer of Care Note ? ?Patient: James Serrano ? ?Procedure(s) Performed: ROBOTIC ASSISTED NAVIGATIONAL BRONCHOSCOPY (Left) ?BRONCHIAL BIOPSIES ?BRONCHIAL NEEDLE ASPIRATION BIOPSIES ?BRONCHIAL BRUSHINGS ? ?Patient Location: PACU ? ?Anesthesia Type:General ? ?Level of Consciousness: awake ? ?Airway & Oxygen Therapy: Patient Spontanous Breathing and Patient connected to face mask oxygen ? ?Post-op Assessment: Report given to RN and Post -op Vital signs reviewed and stable ? ?Post vital signs: Reviewed and stable ? ?Last Vitals:  ?Vitals Value Taken Time  ?BP 151/84 11/12/21 1055  ?Temp 36.9 ?C 11/12/21 1055  ?Pulse 81 11/12/21 1056  ?Resp 20 11/12/21 1056  ?SpO2 97 % 11/12/21 1056  ?Vitals shown include unvalidated device data. ? ?Last Pain:  ?Vitals:  ? 11/12/21 0815  ?TempSrc:   ?PainSc: 0-No pain  ?   ? ?Patients Stated Pain Goal: 0 (11/12/21 0815) ? ?Complications: No notable events documented. ?

## 2021-11-13 ENCOUNTER — Encounter (HOSPITAL_COMMUNITY): Payer: Self-pay | Admitting: Pulmonary Disease

## 2021-11-14 ENCOUNTER — Telehealth: Payer: Self-pay | Admitting: Pulmonary Disease

## 2021-11-14 DIAGNOSIS — C3492 Malignant neoplasm of unspecified part of left bronchus or lung: Secondary | ICD-10-CM

## 2021-11-14 LAB — CYTOLOGY - NON PAP

## 2021-11-15 NOTE — Telephone Encounter (Signed)
[name] patients wife would like to know if results are in for patient. It doesn't look like results are available yet. I notified patients wife that I would call her with results when available.  ? ?Dr. Valeta Harms please advise  ? ?-And please route this back to triage. Thank you!  ?

## 2021-11-15 NOTE — Telephone Encounter (Signed)
Called and spoke with [name] [facility]) and scheduled appt with Eric Form, NP for 11/22/21 to discuss Bronch results.  ?

## 2021-11-18 NOTE — Telephone Encounter (Signed)
PCCM: ? ?I called and spoke with the patient's wife regarding pathology report.  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 ? ?Of note, the right lower lobe lesion was not visible on the PET scan. ? ?Orders have been placed for referral to radiation oncology and medical oncology. ? ?[name], DO ?[facility] ?11/18/2021 6:25 PM  ? ?

## 2021-11-19 ENCOUNTER — Encounter: Payer: Self-pay | Admitting: *Deleted

## 2021-11-19 ENCOUNTER — Telehealth: Payer: Self-pay | Admitting: Internal Medicine

## 2021-11-19 DIAGNOSIS — R911 Solitary pulmonary nodule: Secondary | ICD-10-CM

## 2021-11-19 NOTE — Progress Notes (Signed)
Oncology Nurse Navigator Documentation ? ? ?  11/19/2021  ?  8:00 AM  ?Oncology Nurse Navigator Flowsheets  ?Navigator Location [facility]  ?Referral Date to RadOnc/MedOnc 11/19/2021  ?Navigator Encounter Type Other:  ?Treatment Phase Pre-Tx/Tx Discussion  ?Barriers/Navigation Needs Coordination of Care/I received referral on Mr. Schweigert today. I updated new patient coordinator to call and schedule him with Dr. Julien Nordmann on 4/6 with labs.   ?Interventions Coordination of Care  ?Acuity Level 2-Minimal Needs (1-2 Barriers Identified)  ?Coordination of Care Other  ?Time Spent with Patient 30  ?  ?

## 2021-11-19 NOTE — Progress Notes (Signed)
Oncology Nurse Navigator Documentation ? ? ?  11/19/2021  ? 10:00 AM 11/19/2021  ?  8:00 AM  ?Oncology Nurse Navigator Flowsheets  ?[facility]  ?Referral Date to RadOnc/MedOnc  11/19/2021  ?Navigator Encounter Type Appt/Treatment Plan Review Other:  ?Patient Visit Type Other   ?Treatment Phase Pre-Tx/Tx Discussion Pre-Tx/Tx Discussion  ?Barriers/Navigation Needs Coordination of Care/I received a message that patient will be out of town next week and will be able to come in a few weeks. New patient coordinator re-scheduled patient.  Coordination of Care  ?Interventions Coordination of Care Coordination of Care  ?Acuity Level 2-Minimal Needs (1-2 Barriers Identified) Level 2-Minimal Needs (1-2 Barriers Identified)  ?Coordination of Care  Other  ?Time Spent with Patient 30 30  ?  ?

## 2021-11-19 NOTE — Telephone Encounter (Signed)
Scheduled appt per 3/28 staff msg from [employer]. Pt's wife is aware of appt date and time. Pt's wife is aware to arrive 15 mins prior to appt time and to bring and updated insurance card. Pt's wife is aware of appt location.   ?

## 2021-11-21 ENCOUNTER — Other Ambulatory Visit: Payer: Self-pay

## 2021-11-21 ENCOUNTER — Other Ambulatory Visit: Payer: Self-pay | Admitting: *Deleted

## 2021-11-21 ENCOUNTER — Encounter: Payer: Self-pay | Admitting: *Deleted

## 2021-11-21 NOTE — Progress Notes (Signed)
Oncology Nurse Navigator Documentation ? ? ?  11/21/2021  ?  3:00 PM 11/19/2021  ? 10:00 AM 11/19/2021  ?  8:00 AM  ?Oncology Nurse Navigator Flowsheets  ?Abnormal Finding Date 09/08/2021    ?Confirmed Diagnosis Date 11/12/2021    ?Diagnosis Status Pending Molecular Studies    ?Navigator Follow Up Date: 12/05/2021    ?Navigator Follow Up Reason: New Patient Appointment    ?Navigator Location [facility]  ?Referral Date to RadOnc/MedOnc   11/19/2021  ?Navigator Encounter Type Pathology Review Appt/Treatment Plan Review Other:  ?Patient Visit Type Other Other   ?Treatment Phase Pre-Tx/Tx Discussion Pre-Tx/Tx Discussion Pre-Tx/Tx Discussion  ?Barriers/Navigation Needs Coordination of Care/per Dr. Julien Nordmann, I notified pathology team to send recent bx for PDL 1 testing.  Coordination of Care Coordination of Care  ?Interventions Coordination of Care Coordination of Care Coordination of Care  ?Acuity Level 2-Minimal Needs (1-2 Barriers Identified) Level 2-Minimal Needs (1-2 Barriers Identified) Level 2-Minimal Needs (1-2 Barriers Identified)  ?Coordination of Care Pathology  Other  ?Time Spent with Patient 30 30 30   ?  ?

## 2021-11-21 NOTE — Progress Notes (Signed)
The proposed treatment discussed in conference is for discussion purpose only and is not a binding recommendation. The patient was not physically examined, or presented with their treatment options. Therefore, final treatment plans cannot be decided.  ?

## 2021-11-22 ENCOUNTER — Encounter: Payer: Self-pay | Admitting: Acute Care

## 2021-11-22 ENCOUNTER — Ambulatory Visit (INDEPENDENT_AMBULATORY_CARE_PROVIDER_SITE_OTHER): Payer: 59 | Admitting: Acute Care

## 2021-11-22 VITALS — BP 128/74 | HR 82 | Temp 97.7°F | Ht 72.0 in | Wt 248.4 lb

## 2021-11-22 DIAGNOSIS — F17210 Nicotine dependence, cigarettes, uncomplicated: Secondary | ICD-10-CM | POA: Diagnosis not present

## 2021-11-22 DIAGNOSIS — C3491 Malignant neoplasm of unspecified part of right bronchus or lung: Secondary | ICD-10-CM | POA: Diagnosis not present

## 2021-11-22 DIAGNOSIS — J449 Chronic obstructive pulmonary disease, unspecified: Secondary | ICD-10-CM | POA: Diagnosis not present

## 2021-11-22 NOTE — Progress Notes (Signed)
? ?History of Present Illness ?James Serrano is a 56 y.o. male current every day heavy smoker with COPD and new diagnosis of squamous cell carcinoma after routine lung cancer screening. He is followed by Dr. Elsworth Serrano and Dr. Valeta Serrano ? ? ?11/22/2021 ?Pt. Presents for follow up. He underwent Robotic assisted navigational bronchoscopy with bronchial biopsies, bronchial ?Needle aspirations and bronchial washings on 11/12/2021 for a  2 cm left upper lobe pulmonary nodule with increased uptake on PET/CT.He states he has done well after the procedure. No bleeding post procedure, no fever, no sore throat. He has fully recovered form the procedure.  ?He is compliant with  Breztri and Albuterol for his COPD. ?We had a long discussion about working on quitting smoking. He states he has cut down but he has not quit. I have provided him with the 1-800-QUIT NOW number for smoking cessation. We discussed that quitting was in his best interest, especially now.  ?Discussed early intervention with any COPD flare while being treated for a cancer, as cancer treatment can suppress immune system. ?Pt. Was initially evaluated by Dr. Kipp Serrano for surgical resection. He had concerns about being oxygen dependent after surgery , and opted for referral to medical oncology and radiation oncology. He has follow up appointment with both on 12/05/2021. We did discuss that he will need to take some time off work for treatment, and he said that would not be a problem.  ? ?Pt was here with his wife James Serrano. He has a good support system.  ? ?Test Results: ? ?Cytology 11/12/2021 ?FINAL MICROSCOPIC DIAGNOSIS:  ?C. LUNG, RLL, BRUSHING:  ?- Malignant cells consistent with squamous cell carcinoma  ? ?D. LUNG, RLL, FINE NEEDLE ASPIRATION AND BIOPSIES:  ?- Malignant cells consistent with squamous cell carcinoma  ? ? PET 09/30/2021 ?  ?  ?  ?  ?  ?  ?  ?   ?  ? ?Low Dose CT Chest ?09/08/2021 ?Lung-RADS 4B, suspicious. Additional imaging evaluation  or ?consultation with Pulmonology or Thoracic Surgery recommended. ?Dominant 19.7 mm central left upper lobe irregular pulmonary nodule, ?concerning for primary bronchogenic carcinoma. PET-CT suggested for ?further characterization. ?2. Three-vessel coronary atherosclerosis. ?3. Stable left adrenal adenoma. ?4. Aortic Atherosclerosis (ICD10-I70.0) and Emphysema (ICD10-J43.9). ? ? ? ?  Latest Ref Rng & Units 11/12/2021  ?  8:29 AM 08/20/2020  ?  4:41 PM 02/12/2014  ?  3:27 PM  ?CBC  ?WBC 4.0 - 10.5 K/uL 4.5   8.5   5.9    ?Hemoglobin 13.0 - 17.0 g/dL 16.4   17.7   16.9    ?Hematocrit 39.0 - 52.0 % 47.7   52.2   48.1    ?Platelets 150 - 400 K/uL 192   215   167    ? ? ? ?  Latest Ref Rng & Units 03/16/2020  ? 10:10 AM 02/12/2014  ? 11:31 AM  ?BMP  ?Glucose 65 - 99 mg/dL 104   106    ?BUN 6 - 24 mg/dL 7   8    ?Creatinine 0.76 - 1.27 mg/dL 0.94   0.84    ?BUN/Creat Ratio 9 - 20 7     ?Sodium 134 - 144 mmol/L 139   139    ?Potassium 3.5 - 5.2 mmol/L 4.9   4.3    ?Chloride 96 - 106 mmol/L 99   99    ?CO2 20 - 29 mmol/L 26   27    ?Calcium 8.7 - 10.2 mg/dL 9.8  9.6    ? ? ?BNP ?No results found for: BNP ? ?ProBNP ?No results found for: PROBNP ? ?PFT ?   ?Component Value Date/Time  ? FEV1PRE 1.35 09/13/2021 0944  ? FEV1POST 1.47 09/13/2021 0944  ? FVCPRE 2.80 09/13/2021 0944  ? FVCPOST 2.94 09/13/2021 0944  ? TLC 8.40 09/13/2021 0944  ? [place] 27.71 09/13/2021 0944  ? PREFEV1FVCRT 48 09/13/2021 0944  ? PSTFEV1FVCRT 50 09/13/2021 0944  ? ? ?DG CHEST PORT 1 VIEW ? ?Result Date: 11/12/2021 ?CLINICAL DATA:  56 year old male status post bronchoscopy. EXAM: PORTABLE CHEST 1 VIEW COMPARISON:  Chest x-ray 09/14/2021. FINDINGS: Lung volumes are normal. No consolidative airspace disease. No pleural effusions. No pneumothorax. Known left upper lobe pulmonary nodule appears similar to prior examinations. No other new suspicious appearing pulmonary nodule or mass noted. Pulmonary vasculature and the cardiomediastinal silhouette are within  normal limits. Atherosclerosis in the thoracic aorta. IMPRESSION: 1.  No radiographic evidence of acute cardiopulmonary disease. 2. Left upper lobe pulmonary nodule again noted. 3. Aortic atherosclerosis. Electronically Signed   By: Vinnie Langton M.D.   On: 11/12/2021 11:56  ? ?CT Super D Chest Wo Contrast ? ?Result Date: 11/08/2021 ?CLINICAL DATA:  Hypermetabolic left upper lobe lung nodule compatible with malignancy. * Tracking Code: BO * EXAM: CT CHEST WITHOUT CONTRAST TECHNIQUE: Multidetector CT imaging of the chest was performed using thin slice collimation for electromagnetic bronchoscopy planning purposes, without intravenous contrast. RADIATION DOSE REDUCTION: This exam was performed according to the departmental dose-optimization program which includes automated exposure control, adjustment of the mA and/or kV according to patient size and/or use of iterative reconstruction technique. COMPARISON:  09/06/2021 and PET-CT from 09/27/2021 FINDINGS: Cardiovascular: Coronary, aortic arch, and branch vessel atherosclerotic vascular disease. Mediastinum/Nodes: Small mediastinal and hilar lymph nodes are not pathologically enlarged by size criteria. Lungs/Pleura: Centrilobular and paraseptal emphysema. Airway thickening is present, suggesting bronchitis or reactive airways disease. There is some mild subpleural nodularity in the right chest which appears unchanged from 09/06/2021 The previously hypermetabolic left upper lobe nodule occluding the lingular bronchus measures 2.2 by 1.6 cm on image 87 series 4, formerly 2.1 by 1.5 cm. Some of this measurement may include adjacent vascular structures or plugged bronchi. There is plugging of the bronchi distal to this nodule as on prior exams, along with some atelectasis in the lingula as on prior exams. Subpleural nodularity along a accessory fissure above the lingula measures about 6 by 4 mm on image 94 series 4, stable. Other scattered small subpleural nodules are  present including a 6 by 5 mm left lower lobe subpleural nodule on image 107 of series 4, these appear stable. None of the subpleural nodules were demonstrably hypermetabolic on prior PET-CT although most are below sensitive PET-CT size thresholds. Upper Abdomen: 1.3 by 2.1 cm nodule of the left adrenal gland has an internal density of -10 Hounsfield units compatible with adrenal adenoma. Musculoskeletal: Thoracic spondylosis. IMPRESSION: 1. 2.1 by 1.6 cm left upper lobe nodule causes plugging of the lingular bronchus and plugging distal to the nodule. This previously measured 2.1 by 1.5 cm on 09/06/2021. 2. There are a few small stable subpleural nodules in both lungs, not previously hypermetabolic but below sensitive PET-CT size thresholds. This may merit surveillance. 3. Other imaging findings of potential clinical significance: Aortic Atherosclerosis (ICD10-I70.0) and Emphysema (ICD10-J43.9). Coronary atherosclerosis. Airway thickening is present, suggesting bronchitis or reactive airways disease. Left adrenal adenoma. Thoracic spondylosis. Electronically Signed   By: Van Clines M.D.   On: 11/08/2021  14:38  ? ?DG C-ARM BRONCHOSCOPY ? ?Result Date: 11/12/2021 ?C-ARM BRONCHOSCOPY: Fluoroscopy was utilized by the requesting physician.  No radiographic interpretation.   ? ? ?Past medical hx ?Past Medical History:  ?Diagnosis Date  ? Anxiety   ? Asthma   ? Bronchitis   ? COPD (chronic obstructive pulmonary disease) ([place])   ? COVID-19 08/2020  ? GERD (gastroesophageal reflux disease)   ? Tobacco use   ?  ? ?Social History  ? ?Tobacco Use  ? Smoking status: Every Day  ?  Packs/day: 1.00  ?  Years: 30.00  ?  Pack years: 30.00  ?  Types: Cigarettes  ? Smokeless tobacco: Never  ? Tobacco comments:  ?  1 ppd 10/04/2021  ?  1/2 ppd 11/01/2021  ?Substance Use Topics  ? Alcohol use: Yes  ?  Comment: occasionally  ? Drug use: No  ? ? ?Mr.Medlen reports that he has been smoking cigarettes. He has a 30.00 pack-year smoking  history. He has never used smokeless tobacco. He reports current alcohol use. He reports that he does not use drugs. ? ?Tobacco Cessation: ?Current Every day smoker , 1/2 PPD , working on quitting ? ?Past surgical hx,

## 2021-11-22 NOTE — Patient Instructions (Addendum)
It is good to see you today. ?Continue Breztri and Albuterol for your COPD as you have been doing.  ?Follow up with Dr. Julien Nordmann and Dr. Sondra Come as is scheduled.  ?Please work on quitting smoking.  ?Call 1-800-QUIT NOW for free nicotine patches , gum or mints.  ?There are classes available through [facility] for smoking cessation.  ?Follow up in 6 months with Dr. Elsworth Soho for COPD ?Call if you need [place] if you need [place] sooner.  ?Note your daily symptoms > remember "red flags" for COPD:  Increase in cough, increase in sputum production, increase in shortness of breath or activity intolerance. If you notice these symptoms, please call early to be seen.   ?Please contact office for sooner follow up if symptoms do not improve or worsen or seek emergency care    ? . ? ?

## 2021-11-29 NOTE — Progress Notes (Signed)
Location of tumor and Histology per Pathology Report: LUL lung, RLL lung ? ?Biopsy:  ?11/12/2021 ?A. LUNG, LUL, BRUSHING:  ?- Malignant cells consistent with squamous cell carcinoma  ? ?B. LUNG, LUL, FINE NEEDLE ASPIRATION AND BIOPSIES:  ?- Malignant cells consistent with squamous cell carcinoma ? ?C. LUNG, RLL, BRUSHING:  ?- Malignant cells consistent with squamous cell carcinoma  ? ?D. LUNG, RLL, FINE NEEDLE ASPIRATION AND BIOPSIES:  ?- Malignant cells consistent with squamous cell carcinoma ? ?Past/Anticipated interventions by surgeon, if any:   ?Video Bronchoscopy with Robotic Assisted Bronchoscopic Navigation  ? Date of Operation: 11/12/2021  ?Surgeon: [name], DO ? ?Past/Anticipated interventions by medical oncology, if any: appointment with Dr Julien Nordmann scheduled 12/05/2021 ? ? ? ?Pain issues, if any:  no  ? ?SAFETY ISSUES: ?Prior radiation? no ?Pacemaker/ICD? no ?Possible current pregnancy? no ?Is the patient on methotrexate? no ? ?Current Complaints / other details:  shortness of breath with activity and at rest especially at night, productive cough with clear to yellow phlegm, denies hemoptysis ?   ? ?Vitals:  ? 12/05/21 1415  ?BP: (!) 141/80  ?Pulse: 82  ?Resp: 20  ?Temp: 97.7 ?F (36.5 ?C)  ?SpO2: 98%  ?Weight: 247 lb 6.4 oz (112.2 kg)  ?Height: 6' (1.829 m)  ? ? ? ?

## 2021-12-04 NOTE — Progress Notes (Signed)
?Radiation Oncology         [phone] ?________________________________ ? ?Initial Outpatient Consultation ? ?Name: James Serrano MRN: [id number]  ?Date: 12/05/2021  DOB: 03-13-1966 ? ?[id number], Elayne Snare, MD  [name], DO  ? ?REFERRING PHYSICIAN: [name], DO ? 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 ?HISTORY OF PRESENT ILLNESS::James Serrano is a 56 y.o. male with COPD and a significant smoking history. Today, he is accompanied by his wife. he is seen as a courtesy of Dr. Valeta Harms for an opinion concerning radiation therapy as part of management for his recently diagnosed bilateral lung cancers.  ? ?The patient presented for a routine lung cancer screening CT on 09/06/21 which showed numerous solid pulmonary nodules scattered throughout both lungs, the largest and most suspicious of which located in the central left upper lobe measuring 19.7 mm in diameter, and concerning for primary bronchogenic carcinoma.  ? ?PET on 09/27/21 further revealed the left upper lobe infrahilar nodule as hypermetabolic and with maximum SUV 10.8, compatible with malignancy. This nodule was also noted to truncate or occlude the inferior lingular bronchus. PET otherwise showed no findings of distant metastatic spread or hypermetabolic adenopathy.  ? ?The patient followed up with Dr. Elsworth Soho on 10/04/21 to discuss these findings. During this visit, the patient endorsed ongoing SOB and chronic cough. Diagnostic and treatment options were discussed with the patient including bronchoscopy and lobectomy, for which Dr. Elsworth Soho placed a referral to Thoracic surgery for consideration.  ? ?Accordingly, the patient met with Dr. Kipp Brood on 10/11/21 for discuss surgical options. Complications of surgery were discussed with the patient and he was advised smoking cessation and pulmonary rehab prior to surgery.  However, the patient expressed that he would not want  to take the risk of being on oxygen  following surgery, and would prefer radiation treatment instead.  ? ?Super D chest CT without contrast on 11/08/21 showed the left upper lobe nodule to cause plugging of the lingular bronchus and plugging distal to the nodule, measuring 2.1 x 1.6 cm, previously 2.1 by 1.5 cm on screening CT. CT also showed a few small stable subpleural nodules in both lungs, not previously hypermetabolic but below sensitive PET-CT size thresholds.  ? ?The patient proceeded to undergo bronchoscopy and biopsies on 11/13/19 under the care of Dr. Valeta Harms. Biopsies collected from the LUL and RLL revealed findings consistent with squamous cell carcinoma. (PDL1 negative).  ? ?The endobronchial lesion found at the time of bronchoscopy was a surprise as there was no unusual findings in this area on PET scan or CT scan.  The area where there was irregular mucosa within the right lower lobe orifice was biopsied and returned squamous cell carcinoma as above. ? ?Per his most recent follow up with pulmonology on 11/22/21, the patient was noted to be doing well post bronchoscopy. In regards to his COPD, the patient was instructed to continue on Breztri and Albuterol. He was also again counseled on smoking cessation as during his prior visits with Dr. Elsworth Soho.   ? ?Of note: On 09/07/21, the patient presented to the [facility] with a  2 to 3-day history of cough, sinus pain and pressure, nasal congestion, postnasal drip, wheezing, and occasional shortness of breath. He was treated for URI and COPD exacerbation with prednisone, Phenergan DM, and was instructed to continue his albuterol and Symbicort regimen.  He did not have any improvement and connected via video visit with urgent care  on 09/09/2021 and was diagnosed with bronchitis, and prescribed doxycycline. His symptoms continued to persist and he returned to the urgent care on 09/14/21. Given persistence of his symptoms, he was suspected to have an acute sinus infection and given  Augmentin and 1 more round of prednisone. (CXR performed on 09/14/21 showed COPD without evidence of acute cardiopulmonary disease).   ? ? ? ?PREVIOUS RADIATION THERAPY: No ? ?PAST MEDICAL HISTORY:  ?Past Medical History:  ?Diagnosis Date  ? Anxiety   ? Asthma   ? Bronchitis   ? COPD (chronic obstructive pulmonary disease) ([place])   ? COVID-19 08/2020  ? GERD (gastroesophageal reflux disease)   ? Tobacco use   ? ? ?PAST SURGICAL HISTORY: ?Past Surgical History:  ?Procedure Laterality Date  ? BRONCHIAL BIOPSY  11/12/2021  ? Procedure: BRONCHIAL BIOPSIES;  Surgeon: [name], DO;  Location: [facility];  Service: Pulmonary;;  ? BRONCHIAL BRUSHINGS  11/12/2021  ? Procedure: BRONCHIAL BRUSHINGS;  Surgeon: [name], DO;  Location: [facility];  Service: Pulmonary;;  ? BRONCHIAL NEEDLE ASPIRATION BIOPSY  11/12/2021  ? Procedure: BRONCHIAL NEEDLE ASPIRATION BIOPSIES;  Surgeon: [name], DO;  Location: [facility];  Service: Pulmonary;;  ? ORIF ACETABULAR FRACTURE    ? TONSILLECTOMY    ? ? ?FAMILY HISTORY:  ?Family History  ?Problem Relation Age of Onset  ? Heart attack Father   ? Cancer Father   ? Cancer Brother   ? Cancer Mother   ? ? ?SOCIAL HISTORY:  ?Social History  ? ?Tobacco Use  ? Smoking status: Every Day  ?  Packs/day: 1.00  ?  Years: 30.00  ?  Pack years: 30.00  ?  Types: Cigarettes  ? Smokeless tobacco: Never  ? Tobacco comments:  ?  1 ppd 10/04/2021  ?  1/2 ppd 11/01/2021  ?Substance Use Topics  ? Alcohol use: Yes  ?  Comment: occasionally  ? Drug use: No  ? ? ?ALLERGIES: No Known Allergies ? ?MEDICATIONS:  ?Current Outpatient Medications  ?Medication Sig Dispense Refill  ? albuterol (VENTOLIN HFA) 108 (90 Base) MCG/ACT inhaler Inhale 2 puffs into the lungs every 4 (four) hours as needed. (Patient taking differently: Inhale 2 puffs into the lungs 2 (two) times daily.) 9 g 12  ? alum & mag hydroxide-simeth (MAALOX/MYLANTA) 200-200-20 MG/5ML suspension Take 30 mLs by mouth every 6 (six) hours  as needed for indigestion or heartburn.    ? Budeson-Glycopyrrol-Formoterol (BREZTRI AEROSPHERE) 160-9-4.8 MCG/ACT AERO Inhale 2 puffs into the lungs in the morning and at bedtime. (Patient taking differently: Inhale 2 puffs into the lungs daily.) 10.7 g 11  ? clonazePAM (KLONOPIN) 0.5 MG tablet Take 1 tablet (0.5 mg total) by mouth 2 (two) times daily as needed for anxiety. (Patient not taking: Reported on 12/05/2021) 20 tablet 0  ? pantoprazole (PROTONIX) 40 MG tablet Take 1 tablet (40 mg total) by mouth daily. (Patient not taking: Reported on 12/05/2021) 30 tablet 5  ? ?No current facility-administered medications for this encounter.  ? ? ?REVIEW OF SYSTEMS:  A 10+ POINT REVIEW OF SYSTEMS WAS OBTAINED including neurology, dermatology, psychiatry, cardiac, respiratory, lymph, extremities, GI, GU, musculoskeletal, constitutional, reproductive, HEENT.  He denies any pain within the chest area significant cough or hemoptysis. ?  ?PHYSICAL EXAM:  height is 6' (1.829 m) and weight is 247 lb 6.4 oz (112.2 kg). His temperature is 97.7 ?F (36.5 ?C). His blood pressure is 141/80 (abnormal) and his pulse is 82. His respiration is 20 and oxygen  saturation is 98%.   ?General: Alert and oriented, in no acute distress ?HEENT: Head is normocephalic. Extraocular movements are intact. Oropharynx is clear. ?Neck: Neck is supple, no palpable cervical or supraclavicular lymphadenopathy. ?Heart: Regular in rate and rhythm with no murmurs, rubs, or gallops. ?Chest: Clear to auscultation bilaterally, with no rhonchi, wheezes, or rales. ?Abdomen: Soft, nontender, nondistended, with no rigidity or guarding. ?Extremities: No cyanosis or edema. ?Lymphatics: see Neck Exam ?Skin: No concerning lesions. ?Musculoskeletal: symmetric strength and muscle tone throughout. ?Neurologic: Cranial nerves II through XII are grossly intact. No obvious focalities. Speech is fluent. Coordination is intact. ?Psychiatric: Judgment and insight are intact. Affect  is appropriate. ? ? ?ECOG = 1 ? ?0 - Asymptomatic (Fully active, able to carry on all predisease activities without restriction) ? ?1 - Symptomatic but completely ambulatory (Restricted in physically stre

## 2021-12-05 ENCOUNTER — Other Ambulatory Visit: Payer: Self-pay

## 2021-12-05 ENCOUNTER — Ambulatory Visit
Admission: RE | Admit: 2021-12-05 | Discharge: 2021-12-05 | Disposition: A | Discharge status: Home / Self Care | Payer: 59 | Source: Ambulatory Visit | Attending: Radiation Oncology | Admitting: Radiation Oncology

## 2021-12-05 ENCOUNTER — Encounter: Payer: Self-pay | Admitting: Radiation Oncology

## 2021-12-05 ENCOUNTER — Encounter: Payer: Self-pay | Admitting: Internal Medicine

## 2021-12-05 ENCOUNTER — Inpatient Hospital Stay: Payer: 59

## 2021-12-05 ENCOUNTER — Inpatient Hospital Stay: Payer: 59 | Attending: Internal Medicine | Admitting: Internal Medicine

## 2021-12-05 VITALS — BP 147/86 | HR 72 | Temp 97.9°F | Resp 18 | Ht 72.0 in | Wt 247.7 lb

## 2021-12-05 VITALS — BP 141/80 | HR 82 | Temp 97.7°F | Resp 20 | Ht 72.0 in | Wt 247.4 lb

## 2021-12-05 DIAGNOSIS — J432 Centrilobular emphysema: Secondary | ICD-10-CM | POA: Insufficient documentation

## 2021-12-05 DIAGNOSIS — K219 Gastro-esophageal reflux disease without esophagitis: Secondary | ICD-10-CM | POA: Insufficient documentation

## 2021-12-05 DIAGNOSIS — R911 Solitary pulmonary nodule: Secondary | ICD-10-CM

## 2021-12-05 DIAGNOSIS — I700 Atherosclerosis of aorta: Secondary | ICD-10-CM | POA: Insufficient documentation

## 2021-12-05 DIAGNOSIS — C3431 Malignant neoplasm of lower lobe, right bronchus or lung: Secondary | ICD-10-CM | POA: Insufficient documentation

## 2021-12-05 DIAGNOSIS — Z79899 Other long term (current) drug therapy: Secondary | ICD-10-CM | POA: Insufficient documentation

## 2021-12-05 DIAGNOSIS — C3412 Malignant neoplasm of upper lobe, left bronchus or lung: Secondary | ICD-10-CM | POA: Insufficient documentation

## 2021-12-05 DIAGNOSIS — D3502 Benign neoplasm of left adrenal gland: Secondary | ICD-10-CM | POA: Insufficient documentation

## 2021-12-05 DIAGNOSIS — C3490 Malignant neoplasm of unspecified part of unspecified bronchus or lung: Secondary | ICD-10-CM | POA: Diagnosis not present

## 2021-12-05 DIAGNOSIS — I2510 Atherosclerotic heart disease of native coronary artery without angina pectoris: Secondary | ICD-10-CM | POA: Insufficient documentation

## 2021-12-05 DIAGNOSIS — F17210 Nicotine dependence, cigarettes, uncomplicated: Secondary | ICD-10-CM | POA: Insufficient documentation

## 2021-12-05 DIAGNOSIS — Z8616 Personal history of COVID-19: Secondary | ICD-10-CM | POA: Insufficient documentation

## 2021-12-05 DIAGNOSIS — M47814 Spondylosis without myelopathy or radiculopathy, thoracic region: Secondary | ICD-10-CM | POA: Insufficient documentation

## 2021-12-05 DIAGNOSIS — Z809 Family history of malignant neoplasm, unspecified: Secondary | ICD-10-CM | POA: Insufficient documentation

## 2021-12-05 LAB — CBC WITH DIFFERENTIAL (CANCER CENTER ONLY)
Abs Immature Granulocytes: 0.01 10*3/uL (ref 0.00–0.07)
Basophils Absolute: 0 10*3/uL (ref 0.0–0.1)
Basophils Relative: 0 %
Eosinophils Absolute: 0.4 10*3/uL (ref 0.0–0.5)
Eosinophils Relative: 8 %
HCT: 48.3 % (ref 39.0–52.0)
Hemoglobin: 16.7 g/dL (ref 13.0–17.0)
Immature Granulocytes: 0 %
Lymphocytes Relative: 26 %
Lymphs Abs: 1.3 10*3/uL (ref 0.7–4.0)
MCH: 33.9 pg (ref 26.0–34.0)
MCHC: 34.6 g/dL (ref 30.0–36.0)
MCV: 98.2 fL (ref 80.0–100.0)
Monocytes Absolute: 0.5 10*3/uL (ref 0.1–1.0)
Monocytes Relative: 10 %
Neutro Abs: 2.8 10*3/uL (ref 1.7–7.7)
Neutrophils Relative %: 56 %
Platelet Count: 170 10*3/uL (ref 150–400)
RBC: 4.92 MIL/uL (ref 4.22–5.81)
RDW: 12.4 % (ref 11.5–15.5)
WBC Count: 5.1 10*3/uL (ref 4.0–10.5)
nRBC: 0 % (ref 0.0–0.2)

## 2021-12-05 LAB — CMP (CANCER CENTER ONLY)
ALT: 16 U/L (ref 0–44)
AST: 18 U/L (ref 15–41)
Albumin: 4.3 g/dL (ref 3.5–5.0)
Alkaline Phosphatase: 62 U/L (ref 38–126)
Anion gap: 4 — ABNORMAL LOW (ref 5–15)
BUN: 7 mg/dL (ref 6–20)
CO2: 30 mmol/L (ref 22–32)
Calcium: 9.5 mg/dL (ref 8.9–10.3)
Chloride: 103 mmol/L (ref 98–111)
Creatinine: 0.97 mg/dL (ref 0.61–1.24)
GFR, Estimated: >60 mL/min (ref >60)
Glucose, Bld: 109 mg/dL — ABNORMAL HIGH (ref 70–99)
Potassium: 4.3 mmol/L (ref 3.5–5.1)
Sodium: 137 mmol/L (ref 135–145)
Total Bilirubin: 0.7 mg/dL (ref 0.3–1.2)
Total Protein: 7.1 g/dL (ref 6.5–8.1)

## 2021-12-05 NOTE — Patient Instructions (Addendum)
1-800-QUIT-NOW ?External Beam Radiation Therapy, Care After ?This sheet gives you information about how to care for yourself after your procedure. Your health care provider may also give you more specific instructions. If you have problems or questions, contact your health care provider. ?What can I expect after the procedure? ?After the procedure, it is common to have: ?Tiredness (fatigue). ?Red, flaking, dry skin in the treated area. ?A sunburn-like rash on the skin in the treated area. ?Itching in the treated area. ?Other side effects may occur, depending on which part of the body was exposed to radiation and how much radiation was used. These side effects may include: ?Hair loss if the radiation therapy was directed to the head. ?Coughing or difficulty swallowing if the radiation therapy was directed to the head, neck, or chest. ?A type of swelling called lymphedema if the radiation therapy was directed to the head, neck, or chest. ?Nausea, vomiting, or diarrhea if the radiation therapy was directed to the abdomen or pelvis. ?Bladder problems, urinating often, or a lowered ability or desire to have sex (sexual dysfunction). These problems may happen if the radiation therapy was directed to the bladder, kidney, or prostate. ?Memory loss and thinking problems (cognitive changes) if the radiation therapy was directed to the brain. ?Some side effects may show up months to years later. However, most side effects are usually temporary and get better over time. It can take up to 3-4 weeks for you to regain your energy or for side effects to get better. ?Follow these instructions at home: ?Radiation therapy affects everyone differently. Some people do not have side effects. You can take steps at home to help prevent or manage side effects. ?Skin care ? ?Wash your skin with a mild soap as told by your health care provider. Do not scrub or rub your skin. Pat yourself dry. ?Use a mild shampoo and be gentle when washing  your hair. ?Apply gentle lotion or cream to the treated area as told by your health care provider. ?Keep the treated area covered when you are outside. Do not expose treated skin to the sun. ?Avoid scratching the treated area. ?Eating and drinking ?Eat small nutritious meals and snacks regularly during the day. ?Choose bland and soft foods that are easy to eat. ?Follow your health care provider's advice on the type and amount of liquids to drink each day. ?If you have diarrhea, drink plenty of clear fluids. ?General instructions ?Do not use a heating pad or a warm cloth to relieve pain in the treated area. ?Take over-the-counter and prescription medicines only as told by your health care provider. ?Try to maintain your weight during treatment. Ask your health care team for tips. ?Keep all follow-up visits as told by your health care provider. This is important. The visits are usually scheduled 6 weeks to 6 months after radiation therapy. They are needed to determine if the radiation therapy worked as expected. ?Contact a health care provider if: ?You have any of the following in the treated area: ?Pain. ?More redness. ?Open skin or blisters. ?You have a fever. ?You have nausea or vomiting that lasts more than 2 days. ?You have diarrhea that lasts longer than 2 days. ?You lose weight without trying to. ?Get help right away if you: ?Are unable to swallow. ?Have difficulty breathing. ?Have severe vomiting or diarrhea. ?Summary ?After this procedure, it is common to have tiredness (fatigue), skin changes, and other side effects, depending on where the radiation therapy was given. ?Some side effects may  show up months to years later. However, most side effects are usually temporary and get better over time. It can take up to 3-4 weeks for you to regain your energy or for side effects to get better. ?Keep all follow-up visits as told by your health care provider. This is important. The visits are usually scheduled 6 weeks  to 6 months after radiation therapy. ?This information is not intended to replace advice given to you by your health care provider. Make sure you discuss any questions you have with your health care provider. ?Document Revised: 02/20/2021 Document Reviewed: 04/25/2019 ?Elsevier Patient Education ? 2022 [place]. ? ? ?Lung Cancer ?Lung cancer is an abnormal growth of cancerous cells that forms a mass (malignant tumor) in a lung. There are several types of lung cancer. The types are based on the appearance of the tumor cells. The two most common types are: ?Non-small cell lung cancer. This type of lung cancer is the most common type. Non-small cell lung cancers include squamous cell carcinoma, adenocarcinoma, and large cell carcinoma. ?Small cell lung cancer. In this type of lung cancer, abnormal cells are smaller than those of non-small cell lung cancer. Small cell lung cancer gets worse (progresses) faster than non-small cell lung cancer. ?What are the causes? ?The most common cause of lung cancer is smoking tobacco. The second most common cause is exposure to a chemical called radon. ?What increases the risk? ?You are more likely to develop this condition if: ?You smoke tobacco. ?You have been exposed to: ?Secondhand tobacco smoke. ?Radon gas. ?Uranium. ?Asbestos. ?Arsenic in drinking water. ?Air pollution and diesel exhaust. ?You have a family or personal history of lung cancer. ?You have had lung radiation therapy in the past. ?You are older than age 29. ?What are the signs or symptoms? ?In the early stages, you may not have any symptoms. As the cancer progresses, symptoms may include: ?A lasting cough, possibly with blood. ?Fatigue. ?Unexplained weight loss. ?Shortness of breath. ?High-pitched whistling sounds when you breathe, most often when you breathe out (wheezing). ?Chest pain. ?Loss of appetite. ?Symptoms of advanced lung cancer include: ?Hoarseness. ?Bone or joint pain. ?Weakness. ?Change in the  structure of the fingernails (clubbing), so that the nail looks like an upside-down spoon. ?Swelling of the face or arms. ?Inability to move the face (paralysis). ?Drooping eyelids. ?How is this diagnosed? ?This condition may be diagnosed based on: ?Your symptoms and medical history. ?A physical exam. ?A chest X-ray. ?A CT scan. ?Blood tests. ?Sputum tests. ?Removal of a sample of lung tissue (lung biopsy) for testing. ?Your cancer will be assessed (staged) to determine how severe it is and how much it has spread (metastasized). ?How is this treated? ?Treatment depends on the type and stage of your cancer. Treatment may include one or more of the following: ?Surgery to remove as much of the cancer as possible. Lymph nodes in the area may be removed and tested for cancer as well. ?Medicines that kill cancer cells (chemotherapy). ?High-energy rays that kill cancer cells (radiation therapy). ?Targeted therapy. This targets specific parts of cancer cells and the area around them to block the growth and spread of the cancer. Targeted therapy can help limit the damage to healthy cells. ?Immunotherapy. This treatment uses a person's own immune system to fight cancer by either boosting the immune system or changing how the immune system works. ?Follow these instructions at home: ? ?Do not use any products that contain nicotine or tobacco. These products include cigarettes, chewing  tobacco, and vaping devices, such as e-cigarettes. If you need help quitting, ask your health care provider. ?Do not drink alcohol. ?If you are admitted to the hospital, make sure your cancer specialist (oncologist) is aware. Your cancer may affect your treatment for other conditions. ?Take over-the-counter and prescription medicines only as told by your health care provider. ?Work with your health care provider to manage any side effects of treatment. ?Keep all follow-up visits. This is important. ?Where to find support ?Consider joining a local  support group for people who have been diagnosed with lung cancer. ?Where to find more information ?American Cancer Society: www.cancer.org ?[place] www.cancer.gov ?Contact a health

## 2021-12-05 NOTE — Progress Notes (Signed)
See MD note for nursing evaluation. °

## 2021-12-05 NOTE — Progress Notes (Signed)
? ? [place] ?[phone]   [phone] ? ?CONSULT NOTE ? ?REFERRING PHYSICIAN: Dr. Leory Plowman Icard ? ?REASON FOR CONSULTATION:  ?56 years old white male recently diagnosed with lung cancer. ? ?HPI ?James Serrano is a 56 y.o. male with past medical history significant for COPD, GERD, COVID-19 2 years ago, asthma as well as anxiety and long history of smoking.  The patient was complaining of cough and shortness of breath for several weeks in early 2023.  His wife recommended for him to have a CT screening of the chest which was performed on September 09, 2021 and it showed numerous solid pulmonary nodules scattered throughout both lungs the largest and most suspicious of which is a central left upper lobe irregular nodule measuring 1.97 cm in volume derived mean diameter.  The patient had a PET scan on September 27, 2021 and that showed the left upper lobe infrahilar nodule measured 1.1 x 2.0 cm with SUV max of 10.8 compatible with malignancy.  There was no findings of distant metastatic spread or hypermetabolic adenopathy. ?The patient was referred to Dr. Valeta Harms and on November 12, 2021 he underwent video bronchoscopy with robotic assisted bronchoscopic navigation.  He had biopsy of the left upper lobe lung nodule but there was also a right lower lobe endobronchial lesion that was also biopsied during the procedure. ?The final pathology ([id number], [id number]) from the fine-needle aspiration of the left upper lobe lung nodule as well as the right lower lobe endobronchial lesion showed malignant cells consistent with squamous cell carcinoma.  PD-L1 expression was 0%. ?Dr. Valeta Harms kindly referred the patient to me today for evaluation and recommendation regarding treatment of his condition. ?When he is seen today he is complaining of shortness of breath with exertion as well as cough but no significant chest pain or hemoptysis.  He has no nausea, vomiting, diarrhea or  constipation.  He has no headache or visual changes.  He has no recent weight loss or night sweats. ?Family history significant for father with lung cancer and heart disease.  Mother had lymphoma and brother had bone cancer. ?The patient is married and has 1 living daughter and 1 deceased.  He was accompanied today by his wife [name]. He works as an Archivist.  He has a history of his smoking more than 1 pack/day for around 38 years and unfortunately he continues to smoke.  He also drinks around 18 beers every week but no history of drug abuse. ? ?HPI ? ?Past Medical History:  ?Diagnosis Date  ? Anxiety   ? Asthma   ? Bronchitis   ? COPD (chronic obstructive pulmonary disease) ([place])   ? COVID-19 08/2020  ? GERD (gastroesophageal reflux disease)   ? Tobacco use   ? ? ?Past Surgical History:  ?Procedure Laterality Date  ? BRONCHIAL BIOPSY  11/12/2021  ? Procedure: BRONCHIAL BIOPSIES;  Surgeon: [name], DO;  Location: [facility];  Service: Pulmonary;;  ? BRONCHIAL BRUSHINGS  11/12/2021  ? Procedure: BRONCHIAL BRUSHINGS;  Surgeon: [name], DO;  Location: [facility];  Service: Pulmonary;;  ? BRONCHIAL NEEDLE ASPIRATION BIOPSY  11/12/2021  ? Procedure: BRONCHIAL NEEDLE ASPIRATION BIOPSIES;  Surgeon: [name], DO;  Location: [facility];  Service: Pulmonary;;  ? ORIF ACETABULAR FRACTURE    ? TONSILLECTOMY    ? ? ?Family History  ?Problem Relation Age of Onset  ? Heart attack Father   ? Cancer Father   ? Cancer Brother   ?  Cancer Mother   ? ? ?Social History ?Social History  ? ?Tobacco Use  ? Smoking status: Every Day  ?  Packs/day: 1.00  ?  Years: 30.00  ?  Pack years: 30.00  ?  Types: Cigarettes  ? Smokeless tobacco: Never  ? Tobacco comments:  ?  1 ppd 10/04/2021  ?  1/2 ppd 11/01/2021  ?Substance Use Topics  ? Alcohol use: Yes  ?  Comment: occasionally  ? Drug use: No  ? ? ?No Known Allergies ? ?Current Outpatient Medications  ?Medication Sig Dispense Refill  ? albuterol  (VENTOLIN HFA) 108 (90 Base) MCG/ACT inhaler Inhale 2 puffs into the lungs every 4 (four) hours as needed. (Patient taking differently: Inhale 2 puffs into the lungs 2 (two) times daily.) 9 g 12  ? alum & mag hydroxide-simeth (MAALOX/MYLANTA) 200-200-20 MG/5ML suspension Take 30 mLs by mouth every 6 (six) hours as needed for indigestion or heartburn.    ? Budeson-Glycopyrrol-Formoterol (BREZTRI AEROSPHERE) 160-9-4.8 MCG/ACT AERO Inhale 2 puffs into the lungs in the morning and at bedtime. (Patient taking differently: Inhale 2 puffs into the lungs daily.) 10.7 g 11  ? clonazePAM (KLONOPIN) 0.5 MG tablet Take 1 tablet (0.5 mg total) by mouth 2 (two) times daily as needed for anxiety. 20 tablet 0  ? nicotine (NICOTROL) 10 MG inhaler Inhale 1 Cartridge (1 continuous puffing total) into the lungs as needed for up to 150 doses for smoking cessation. 42 each 0  ? pantoprazole (PROTONIX) 40 MG tablet Take 1 tablet (40 mg total) by mouth daily. 30 tablet 5  ? ?No current facility-administered medications for this visit.  ? ? ?Review of Systems ? ?Constitutional: negative ?Eyes: negative ?Ears, nose, mouth, throat, and face: negative ?Respiratory: positive for cough and dyspnea on exertion ?Cardiovascular: negative ?Gastrointestinal: negative ?Genitourinary:negative ?Integument/breast: negative ?Hematologic/lymphatic: negative ?Musculoskeletal:negative ?Neurological: negative ?Behavioral/Psych: negative ?Endocrine: negative ?Allergic/Immunologic: negative ? ?Physical Exam ? ?HYI:FOYDX, healthy, no distress, well nourished, and well developed ?SKIN: skin color, texture, turgor are normal, no rashes or significant lesions ?HEAD: Normocephalic, No masses, lesions, tenderness or abnormalities ?EYES: normal, PERRLA, Conjunctiva are pink and non-injected ?EARS: External ears normal, Canals clear ?OROPHARYNX:no exudate, no erythema, and lips, buccal mucosa, and tongue normal  ?NECK: supple, no adenopathy, no JVD ?LYMPH:  no palpable  lymphadenopathy, no hepatosplenomegaly ?LUNGS: clear to auscultation , and palpation ?HEART: regular rate & rhythm, no murmurs, and no gallops ?ABDOMEN:abdomen soft, non-tender, normal bowel sounds, and no masses or organomegaly ?BACK: Back symmetric, no curvature., No CVA tenderness ?EXTREMITIES:no joint deformities, effusion, or inflammation, no edema  ?NEURO: alert & oriented x 3 with fluent speech, no focal motor/sensory deficits ? ?PERFORMANCE STATUS: ECOG 1 ? ?LABORATORY DATA: ?Lab Results  ?Component Value Date  ? WBC 5.1 12/05/2021  ? HGB 16.7 12/05/2021  ? HCT 48.3 12/05/2021  ? MCV 98.2 12/05/2021  ? PLT 170 12/05/2021  ? ? ?  Chemistry   ?   ?Component Value Date/Time  ? NA 137 12/05/2021 1100  ? NA 139 03/16/2020 1010  ? K 4.3 12/05/2021 1100  ? CL 103 12/05/2021 1100  ? CO2 30 12/05/2021 1100  ? BUN 7 12/05/2021 1100  ? BUN 7 03/16/2020 1010  ? CREATININE 0.97 12/05/2021 1100  ?    ?Component Value Date/Time  ? CALCIUM 9.5 12/05/2021 1100  ? ALKPHOS 62 12/05/2021 1100  ? AST 18 12/05/2021 1100  ? ALT 16 12/05/2021 1100  ? BILITOT 0.7 12/05/2021 1100  ?  ? ? ? ?RADIOGRAPHIC  STUDIES: ?DG CHEST PORT 1 VIEW ? ?Result Date: 11/12/2021 ?CLINICAL DATA:  56 year old male status post bronchoscopy. EXAM: PORTABLE CHEST 1 VIEW COMPARISON:  Chest x-ray 09/14/2021. FINDINGS: Lung volumes are normal. No consolidative airspace disease. No pleural effusions. No pneumothorax. Known left upper lobe pulmonary nodule appears similar to prior examinations. No other new suspicious appearing pulmonary nodule or mass noted. Pulmonary vasculature and the cardiomediastinal silhouette are within normal limits. Atherosclerosis in the thoracic aorta. IMPRESSION: 1.  No radiographic evidence of acute cardiopulmonary disease. 2. Left upper lobe pulmonary nodule again noted. 3. Aortic atherosclerosis. Electronically Signed   By: Vinnie Langton M.D.   On: 11/12/2021 11:56  ? ?CT Super D Chest Wo Contrast ? ?Result Date:  11/08/2021 ?CLINICAL DATA:  Hypermetabolic left upper lobe lung nodule compatible with malignancy. * Tracking Code: BO * EXAM: CT CHEST WITHOUT CONTRAST TECHNIQUE: Multidetector CT imaging of the chest was performed using

## 2021-12-06 ENCOUNTER — Encounter: Payer: Self-pay | Admitting: *Deleted

## 2021-12-06 NOTE — Progress Notes (Signed)
Oncology Nurse Navigator Documentation ? ? ?  12/06/2021  ? 11:00 AM 11/21/2021  ?  3:00 PM 11/19/2021  ? 10:00 AM 11/19/2021  ?  8:00 AM  ?Oncology Nurse Navigator Flowsheets  ?Abnormal Finding Date  09/08/2021    ?Confirmed Diagnosis Date  11/12/2021    ?Diagnosis Status  Pending Molecular Studies    ?Planned Course of Treatment Radiation     ?Phase of Treatment Radiation     ?Radiation Actual Start Date: 12/12/2021     ?Navigator Follow Up Date: 12/09/2021 12/05/2021    ?Navigator Follow Up Reason: Patient Call New Patient Appointment    ?Navigator Location [facility]  ?Referral Date to RadOnc/MedOnc    11/19/2021  ?Navigator Encounter Type Clinic/MDC;Appt/Treatment Plan Review Pathology Review Appt/Treatment Plan Review Other:  ?Treatment Initiated Date 12/12/2021     ?Patient Visit Type Initial;MedOnc;Other Other Other   ?Treatment Phase Pre-Tx/Tx Discussion Pre-Tx/Tx Discussion Pre-Tx/Tx Discussion Pre-Tx/Tx Discussion  ?Barriers/Navigation Needs Coordination of Care;Education/I spoke to patient yesterday at his appt with Dr. Julien Nordmann.  He is newly dx lung cancer and tx plan is radiation and then observation.  Patient was Dr. Sondra Come and is set up for radiation tx to start on 4/20.  I did provide information on his AVS regarding smoking cessation, radiation therapy, and lung cancer.  Coordination of Care Coordination of Care Coordination of Care  ?Education Newly Diagnosed Cancer Education;Other;Smoking cessation     ?Interventions Education;Coordination of Care;Psycho-Social Support Coordination of Care Coordination of Care Coordination of Care  ?Acuity Level 3-Moderate Needs (3-4 Barriers Identified) Level 2-Minimal Needs (1-2 Barriers Identified) Level 2-Minimal Needs (1-2 Barriers Identified) Level 2-Minimal Needs (1-2 Barriers Identified)  ?Coordination of Care Other Pathology  Other  ?Education Method Verbal;Other     ?Time Spent with Patient 45 30 30 30   ?  ?

## 2021-12-12 ENCOUNTER — Other Ambulatory Visit: Payer: Self-pay

## 2021-12-12 ENCOUNTER — Ambulatory Visit
Admission: RE | Admit: 2021-12-12 | Discharge: 2021-12-12 | Disposition: A | Discharge status: Home / Self Care | Payer: 59 | Source: Ambulatory Visit | Attending: Radiation Oncology | Admitting: Radiation Oncology

## 2021-12-12 DIAGNOSIS — C3431 Malignant neoplasm of lower lobe, right bronchus or lung: Secondary | ICD-10-CM | POA: Insufficient documentation

## 2021-12-12 DIAGNOSIS — C3412 Malignant neoplasm of upper lobe, left bronchus or lung: Secondary | ICD-10-CM | POA: Insufficient documentation

## 2021-12-12 DIAGNOSIS — R911 Solitary pulmonary nodule: Secondary | ICD-10-CM

## 2021-12-25 ENCOUNTER — Other Ambulatory Visit: Payer: Self-pay

## 2021-12-25 ENCOUNTER — Ambulatory Visit
Admission: RE | Admit: 2021-12-25 | Discharge: 2021-12-25 | Disposition: A | Discharge status: Home / Self Care | Payer: 59 | Source: Ambulatory Visit | Attending: Radiation Oncology | Admitting: Radiation Oncology

## 2021-12-25 DIAGNOSIS — R911 Solitary pulmonary nodule: Secondary | ICD-10-CM | POA: Diagnosis not present

## 2021-12-25 LAB — RAD ONC ARIA SESSION SUMMARY
Course Elapsed Days: 0
Plan Fractions Treated to Date: 1
Plan Prescribed Dose Per Fraction: 5 Gy
Plan Total Fractions Prescribed: 10
Plan Total Prescribed Dose: 50 Gy
Reference Point Dosage Given to Date: 5 Gy
Reference Point Session Dosage Given: 5 Gy
Session Number: 1

## 2021-12-26 ENCOUNTER — Other Ambulatory Visit: Payer: Self-pay

## 2021-12-26 ENCOUNTER — Ambulatory Visit
Admission: RE | Admit: 2021-12-26 | Discharge: 2021-12-26 | Disposition: A | Discharge status: Home / Self Care | Payer: 59 | Source: Ambulatory Visit | Attending: Radiation Oncology | Admitting: Radiation Oncology

## 2021-12-26 DIAGNOSIS — R911 Solitary pulmonary nodule: Secondary | ICD-10-CM

## 2021-12-26 LAB — RAD ONC ARIA SESSION SUMMARY
Course Elapsed Days: 1
Plan Fractions Treated to Date: 2
Plan Prescribed Dose Per Fraction: 5 Gy
Plan Total Fractions Prescribed: 10
Plan Total Prescribed Dose: 50 Gy
Reference Point Dosage Given to Date: 10 Gy
Reference Point Session Dosage Given: 5 Gy
Session Number: 2

## 2021-12-27 ENCOUNTER — Ambulatory Visit: Payer: 59 | Admitting: Pulmonary Disease

## 2021-12-27 ENCOUNTER — Other Ambulatory Visit: Payer: Self-pay

## 2021-12-27 ENCOUNTER — Ambulatory Visit
Admission: RE | Admit: 2021-12-27 | Discharge: 2021-12-27 | Disposition: A | Discharge status: Home / Self Care | Payer: 59 | Source: Ambulatory Visit | Attending: Radiation Oncology | Admitting: Radiation Oncology

## 2021-12-27 DIAGNOSIS — R911 Solitary pulmonary nodule: Secondary | ICD-10-CM | POA: Diagnosis not present

## 2021-12-27 LAB — RAD ONC ARIA SESSION SUMMARY
Course Elapsed Days: 2
Plan Fractions Treated to Date: 3
Plan Prescribed Dose Per Fraction: 5 Gy
Plan Total Fractions Prescribed: 10
Plan Total Prescribed Dose: 50 Gy
Reference Point Dosage Given to Date: 15 Gy
Reference Point Session Dosage Given: 5 Gy
Session Number: 3

## 2021-12-30 ENCOUNTER — Other Ambulatory Visit: Payer: Self-pay

## 2021-12-30 ENCOUNTER — Ambulatory Visit
Admission: RE | Admit: 2021-12-30 | Discharge: 2021-12-30 | Disposition: A | Discharge status: Home / Self Care | Payer: 59 | Source: Ambulatory Visit | Attending: Radiation Oncology | Admitting: Radiation Oncology

## 2021-12-30 DIAGNOSIS — R911 Solitary pulmonary nodule: Secondary | ICD-10-CM

## 2021-12-30 LAB — RAD ONC ARIA SESSION SUMMARY
Course Elapsed Days: 5
Plan Fractions Treated to Date: 4
Plan Prescribed Dose Per Fraction: 5 Gy
Plan Total Fractions Prescribed: 10
Plan Total Prescribed Dose: 50 Gy
Reference Point Dosage Given to Date: 20 Gy
Reference Point Session Dosage Given: 5 Gy
Session Number: 4

## 2021-12-31 ENCOUNTER — Other Ambulatory Visit: Payer: Self-pay

## 2021-12-31 ENCOUNTER — Ambulatory Visit
Admission: RE | Admit: 2021-12-31 | Discharge: 2021-12-31 | Disposition: A | Discharge status: Home / Self Care | Payer: 59 | Source: Ambulatory Visit | Attending: Radiation Oncology | Admitting: Radiation Oncology

## 2021-12-31 DIAGNOSIS — R911 Solitary pulmonary nodule: Secondary | ICD-10-CM

## 2021-12-31 LAB — RAD ONC ARIA SESSION SUMMARY
Course Elapsed Days: 6
Plan Fractions Treated to Date: 5
Plan Prescribed Dose Per Fraction: 5 Gy
Plan Total Fractions Prescribed: 10
Plan Total Prescribed Dose: 50 Gy
Reference Point Dosage Given to Date: 25 Gy
Reference Point Session Dosage Given: 5 Gy
Session Number: 5

## 2022-01-01 ENCOUNTER — Ambulatory Visit
Admission: RE | Admit: 2022-01-01 | Discharge: 2022-01-01 | Disposition: A | Discharge status: Home / Self Care | Payer: 59 | Source: Ambulatory Visit | Attending: Radiation Oncology | Admitting: Radiation Oncology

## 2022-01-01 ENCOUNTER — Other Ambulatory Visit: Payer: Self-pay

## 2022-01-01 DIAGNOSIS — R911 Solitary pulmonary nodule: Secondary | ICD-10-CM | POA: Diagnosis not present

## 2022-01-01 LAB — RAD ONC ARIA SESSION SUMMARY
Course Elapsed Days: 7
Plan Fractions Treated to Date: 6
Plan Prescribed Dose Per Fraction: 5 Gy
Plan Total Fractions Prescribed: 10
Plan Total Prescribed Dose: 50 Gy
Reference Point Dosage Given to Date: 30 Gy
Reference Point Session Dosage Given: 5 Gy
Session Number: 6

## 2022-01-02 ENCOUNTER — Other Ambulatory Visit: Payer: Self-pay

## 2022-01-02 ENCOUNTER — Ambulatory Visit
Admission: RE | Admit: 2022-01-02 | Discharge: 2022-01-02 | Disposition: A | Discharge status: Home / Self Care | Payer: 59 | Source: Ambulatory Visit | Attending: Radiation Oncology | Admitting: Radiation Oncology

## 2022-01-02 DIAGNOSIS — R911 Solitary pulmonary nodule: Secondary | ICD-10-CM | POA: Diagnosis not present

## 2022-01-02 LAB — RAD ONC ARIA SESSION SUMMARY
Course Elapsed Days: 8
Plan Fractions Treated to Date: 7
Plan Prescribed Dose Per Fraction: 5 Gy
Plan Total Fractions Prescribed: 10
Plan Total Prescribed Dose: 50 Gy
Reference Point Dosage Given to Date: 35 Gy
Reference Point Session Dosage Given: 5 Gy
Session Number: 7

## 2022-01-03 ENCOUNTER — Ambulatory Visit
Admission: RE | Admit: 2022-01-03 | Discharge: 2022-01-03 | Disposition: A | Discharge status: Home / Self Care | Payer: 59 | Source: Ambulatory Visit | Attending: Radiation Oncology | Admitting: Radiation Oncology

## 2022-01-03 ENCOUNTER — Other Ambulatory Visit: Payer: Self-pay

## 2022-01-03 DIAGNOSIS — R911 Solitary pulmonary nodule: Secondary | ICD-10-CM | POA: Diagnosis not present

## 2022-01-03 LAB — RAD ONC ARIA SESSION SUMMARY
Course Elapsed Days: 9
Plan Fractions Treated to Date: 8
Plan Prescribed Dose Per Fraction: 5 Gy
Plan Total Fractions Prescribed: 10
Plan Total Prescribed Dose: 50 Gy
Reference Point Dosage Given to Date: 40 Gy
Reference Point Session Dosage Given: 5 Gy
Session Number: 8

## 2022-01-06 ENCOUNTER — Other Ambulatory Visit: Payer: Self-pay

## 2022-01-06 ENCOUNTER — Ambulatory Visit
Admission: RE | Admit: 2022-01-06 | Discharge: 2022-01-06 | Disposition: A | Discharge status: Home / Self Care | Payer: 59 | Source: Ambulatory Visit | Attending: Radiation Oncology | Admitting: Radiation Oncology

## 2022-01-06 DIAGNOSIS — R911 Solitary pulmonary nodule: Secondary | ICD-10-CM | POA: Diagnosis not present

## 2022-01-06 LAB — RAD ONC ARIA SESSION SUMMARY
Course Elapsed Days: 12
Plan Fractions Treated to Date: 9
Plan Prescribed Dose Per Fraction: 5 Gy
Plan Total Fractions Prescribed: 10
Plan Total Prescribed Dose: 50 Gy
Reference Point Dosage Given to Date: 45 Gy
Reference Point Session Dosage Given: 5 Gy
Session Number: 9

## 2022-01-07 ENCOUNTER — Other Ambulatory Visit: Payer: Self-pay | Admitting: Radiation Oncology

## 2022-01-07 ENCOUNTER — Other Ambulatory Visit: Payer: Self-pay

## 2022-01-07 ENCOUNTER — Encounter: Payer: Self-pay | Admitting: Radiation Oncology

## 2022-01-07 ENCOUNTER — Ambulatory Visit
Admission: RE | Admit: 2022-01-07 | Discharge: 2022-01-07 | Disposition: A | Discharge status: Home / Self Care | Payer: 59 | Source: Ambulatory Visit | Attending: Radiation Oncology | Admitting: Radiation Oncology

## 2022-01-07 DIAGNOSIS — R911 Solitary pulmonary nodule: Secondary | ICD-10-CM

## 2022-01-07 LAB — RAD ONC ARIA SESSION SUMMARY
Course Elapsed Days: 13
Plan Fractions Treated to Date: 10
Plan Prescribed Dose Per Fraction: 5 Gy
Plan Total Fractions Prescribed: 10
Plan Total Prescribed Dose: 50 Gy
Reference Point Dosage Given to Date: 50 Gy
Reference Point Session Dosage Given: 5 Gy
Session Number: 10

## 2022-01-07 MED ORDER — SUCRALFATE 1 G PO TABS: 1.0000 g | ORAL_TABLET | Freq: Three times a day (TID) | ORAL | 1 refills | Status: DC

## 2022-01-11 ENCOUNTER — Telehealth: Payer: 59 | Admitting: Nurse Practitioner

## 2022-01-11 DIAGNOSIS — J208 Acute bronchitis due to other specified organisms: Secondary | ICD-10-CM

## 2022-01-11 MED ORDER — PREDNISONE 10 MG (21) PO TBPK: ORAL_TABLET | ORAL | 0 refills | Status: DC

## 2022-01-11 MED ORDER — PROMETHAZINE-DM 6.25-15 MG/5ML PO SYRP: 5.0000 mL | ORAL_SOLUTION | Freq: Four times a day (QID) | ORAL | 0 refills | Status: DC | PRN

## 2022-01-11 NOTE — Progress Notes (Signed)
We are sorry that you are not feeling well.  Here is how we plan to help!  Based on your presentation I believe you most likely have A cough due to a virus.  This is called viral bronchitis and is best treated by rest, plenty of fluids and control of the cough.  You may use Ibuprofen or Tylenol as directed to help your symptoms.     In addition you may use A prescription cough medication called Promethazine-dextromethorphan  Feel free to reach out to [place] or your PCP if your symptoms do not improve in 5-7 days or if you develop persistent fevers.   Providers prescribe antibiotics to treat infections caused by bacteria. Antibiotics are very powerful in treating bacterial infections when they are used properly. To maintain their effectiveness, they should be used only when necessary. Overuse of antibiotics has resulted in the development of superbugs that are resistant to treatment!    After careful review of your answers, I would not recommend an antibiotic for your condition.  Antibiotics are not effective against viruses and therefore should not be used to treat them. Common examples of infections caused by viruses include colds and flu   From your responses in the eVisit questionnaire you describe inflammation in the upper respiratory tract which is causing a significant cough.  This is commonly called Bronchitis and has four common causes:   Allergies Viral Infections Acid Reflux Bacterial Infection Allergies, viruses and acid reflux are treated by controlling symptoms or eliminating the cause. An example might be a cough caused by taking certain blood pressure medications. You stop the cough by changing the medication. Another example might be a cough caused by acid reflux. Controlling the reflux helps control the cough.  USE OF BRONCHODILATOR ("RESCUE") INHALERS: There is a risk from using your bronchodilator too frequently.  The risk is that over-reliance on a medication which only relaxes  the muscles surrounding the breathing tubes can reduce the effectiveness of medications prescribed to reduce swelling and congestion of the tubes themselves.  Although you feel brief relief from the bronchodilator inhaler, your asthma may actually be worsening with the tubes becoming more swollen and filled with mucus.  This can delay other crucial treatments, such as oral steroid medications. If you need to use a bronchodilator inhaler daily, several times per day, you should discuss this with your provider.  There are probably better treatments that could be used to keep your asthma under control.     HOME CARE Only take medications as instructed by your medical team. Complete the entire course of an antibiotic. Drink plenty of fluids and get plenty of rest. Avoid close contacts especially the very young and the elderly Cover your mouth if you cough or cough into your sleeve. Always remember to wash your hands A steam or ultrasonic humidifier can help congestion.   GET HELP RIGHT AWAY IF: You develop worsening fever. You become short of breath You cough up blood. Your symptoms persist after you have completed your treatment plan MAKE SURE YOU  Understand these instructions. Will watch your condition. Will get help right away if you are not doing well or get worse.    Thank you for choosing an e-visit.  Your e-visit answers were reviewed by a board certified advanced clinical practitioner to complete your personal care plan. Depending upon the condition, your plan could have included both over the counter or prescription medications.  Please review your pharmacy choice. Make sure the pharmacy is open  so you can pick up prescription now. If there is a problem, you may contact your provider through [employer] and have the prescription routed to another pharmacy.  Your safety is important to [place]. If you have drug allergies check your prescription carefully.   For the next 24 hours you  can use MyChart to ask questions about today's visit, request a non-urgent call back, or ask for a work or school excuse. You will get an email in the next two days asking about your experience. I hope that your e-visit has been valuable and will speed your recovery.

## 2022-01-11 NOTE — Addendum Note (Signed)
Addended by: Geryl Rankins on: 01/11/2022 08:35 AM   Modules accepted: Orders

## 2022-01-11 NOTE — Progress Notes (Signed)
I have spent 5 minutes in review of e-visit questionnaire, review and updating patient chart, medical decision making and response to patient.  ° °Blayre Papania W Khalila Buechner, NP ° °  °

## 2022-01-15 ENCOUNTER — Encounter: Payer: Self-pay | Admitting: *Deleted

## 2022-01-15 NOTE — Progress Notes (Signed)
Oncology Nurse Navigator Documentation     01/15/2022    9:00 AM 12/06/2021   11:00 AM 11/21/2021    3:00 PM 11/19/2021   10:00 AM 11/19/2021    8:00 AM  Oncology Nurse Navigator Flowsheets  Abnormal Finding Date   09/08/2021    Confirmed Diagnosis Date   11/12/2021    Diagnosis Status   Pending Molecular Studies    Planned Course of Treatment  Radiation     Phase of Treatment Radiation Radiation     Radiation Actual Start Date:  12/12/2021     Radiation Actual End Date: 01/07/2022      Navigator Follow Up Date: 02/11/2022 12/09/2021 12/05/2021    Navigator Follow Up Reason: Patient Call Patient Call New Patient Appointment    Navigator Location [place]  Referral Date to RadOnc/MedOnc     11/19/2021  Navigator Encounter Type Appt/Treatment Plan Review/I followed up on Mr. Hollenbach's treatment plan. He completed his plan of care and is set up for post tx follow up with med onc and rad onc in the next few weeks.   Clinic/MDC;Appt/Treatment Plan Review Pathology Review Appt/Treatment Plan Review Other:  Treatment Initiated Date  12/12/2021     Patient Visit Type Other Initial;MedOnc;Other Other Other   Treatment Phase Post-Tx Follow-up Pre-Tx/Tx Discussion Pre-Tx/Tx Discussion Pre-Tx/Tx Discussion Pre-Tx/Tx Discussion  Barriers/Navigation Needs Coordination of Care Coordination of Care;Education Coordination of Care Coordination of Care Coordination of Care  Education  Newly Diagnosed Cancer Education;Other;Smoking cessation     Interventions Coordination of Care Education;Coordination of Care;Psycho-Social Support Coordination of Care Coordination of Care Coordination of Care  Acuity Level 2-Minimal Needs (1-2 Barriers Identified) Level 3-Moderate Needs (3-4 Barriers Identified) Level 2-Minimal Needs (1-2 Barriers Identified) Level 2-Minimal Needs (1-2 Barriers Identified) Level 2-Minimal Needs (1-2 Barriers Identified)   Coordination of Care Other Other Pathology  Other  Education Method  Verbal;Other     Time Spent with Patient 11 [phone]

## 2022-01-16 ENCOUNTER — Telehealth: Payer: 59 | Admitting: Emergency Medicine

## 2022-01-16 DIAGNOSIS — K0889 Other specified disorders of teeth and supporting structures: Secondary | ICD-10-CM

## 2022-01-17 ENCOUNTER — Ambulatory Visit: Payer: 59 | Admitting: Pulmonary Disease

## 2022-01-17 MED ORDER — NAPROXEN 500 MG PO TABS: 500.0000 mg | ORAL_TABLET | Freq: Two times a day (BID) | ORAL | 0 refills | Status: DC

## 2022-01-17 MED ORDER — AMOXICILLIN 500 MG PO CAPS: 500.0000 mg | ORAL_CAPSULE | Freq: Three times a day (TID) | ORAL | 0 refills | Status: AC

## 2022-01-17 NOTE — Progress Notes (Signed)

## 2022-01-20 ENCOUNTER — Encounter: Payer: Self-pay | Admitting: Internal Medicine

## 2022-02-06 ENCOUNTER — Encounter: Payer: Self-pay | Admitting: Radiation Oncology

## 2022-02-07 ENCOUNTER — Telehealth: Payer: Self-pay | Admitting: *Deleted

## 2022-02-07 NOTE — Telephone Encounter (Signed)
RETURNED PATIENT'S WIFE'S PHONE CALL, SPOKE [place]

## 2022-02-09 ENCOUNTER — Telehealth: Payer: 59 | Admitting: Emergency Medicine

## 2022-02-09 ENCOUNTER — Encounter: Payer: Self-pay | Admitting: Emergency Medicine

## 2022-02-09 DIAGNOSIS — J208 Acute bronchitis due to other specified organisms: Secondary | ICD-10-CM

## 2022-02-09 MED ORDER — BENZONATATE 100 MG PO CAPS: 100.0000 mg | ORAL_CAPSULE | Freq: Two times a day (BID) | ORAL | 0 refills | Status: DC | PRN

## 2022-02-09 MED ORDER — PREDNISONE 10 MG (21) PO TBPK: ORAL_TABLET | Freq: Every day | ORAL | 0 refills | Status: DC

## 2022-02-09 NOTE — Progress Notes (Signed)
We are sorry that you are not feeling well.  Here is how we plan to help!  Based on your presentation I believe you most likely have A cough due to a virus.  This is called viral bronchitis and is best treated by rest, plenty of fluids and control of the cough.  You may use Ibuprofen or Tylenol as directed to help your symptoms.     In addition you may use A prescription cough medication called Tessalon Perles 100mg . You may take 1-2 capsules every 8 hours as needed for your cough.   Directions for 6 day taper: Day 1: 2 tablets before breakfast, 1 after both lunch & dinner and 2 at bedtime Day 2: 1 tab before breakfast, 1 after both lunch & dinner and 2 at bedtime Day 3: 1 tab at each meal & 1 at bedtime Day 4: 1 tab at breakfast, 1 at lunch, 1 at bedtime Day 5: 1 tab at breakfast & 1 tab at bedtime Day 6: 1 tab at breakfast  From your responses in the eVisit questionnaire you describe inflammation in the upper respiratory tract which is causing a significant cough.  This is commonly called Bronchitis and has four common causes:   Allergies Viral Infections Acid Reflux Bacterial Infection Allergies, viruses and acid reflux are treated by controlling symptoms or eliminating the cause. An example might be a cough caused by taking certain blood pressure medications. You stop the cough by changing the medication. Another example might be a cough caused by acid reflux. Controlling the reflux helps control the cough.     HOME CARE Only take medications as instructed by your medical team. Complete the entire course of an antibiotic. Drink plenty of fluids and get plenty of rest. Avoid close contacts especially the very young and the elderly Cover your mouth if you cough or cough into your sleeve. Always remember to wash your hands A steam or ultrasonic humidifier can help congestion.   GET HELP RIGHT AWAY IF: You develop worsening fever. You become short of breath You cough up  blood. Your symptoms persist after you have completed your treatment plan MAKE SURE YOU  Understand these instructions. Will watch your condition. Will get help right away if you are not doing well or get worse.    Thank you for choosing an e-visit.  Your e-visit answers were reviewed by a board certified advanced clinical practitioner to complete your personal care plan. Depending upon the condition, your plan could have included both over the counter or prescription medications.  Please review your pharmacy choice. Make sure the pharmacy is open so you can pick up prescription now. If there is a problem, you may contact your provider through [employer] and have the prescription routed to another pharmacy.  Your safety is important to [place]. If you have drug allergies check your prescription carefully.   For the next 24 hours you can use MyChart to ask questions about today's visit, request a non-urgent call back, or ask for a work or school excuse. You will get an email in the next two days asking about your experience. I hope that your e-visit has been valuable and will speed your recovery.

## 2022-02-09 NOTE — Progress Notes (Signed)
I have spent 5 minutes in review of e-visit questionnaire, review and updating patient chart, medical decision making and response to patient.   Machaela Caterino, PA-C    

## 2022-02-10 ENCOUNTER — Ambulatory Visit: Payer: 59 | Admitting: Radiation Oncology

## 2022-02-11 ENCOUNTER — Encounter: Payer: Self-pay | Admitting: *Deleted

## 2022-02-11 ENCOUNTER — Ambulatory Visit (HOSPITAL_COMMUNITY)
Admission: RE | Admit: 2022-02-11 | Discharge: 2022-02-11 | Disposition: A | Discharge status: Home / Self Care | Payer: 59 | Source: Ambulatory Visit | Attending: Nurse Practitioner | Admitting: Nurse Practitioner

## 2022-02-11 ENCOUNTER — Other Ambulatory Visit: Payer: Self-pay

## 2022-02-11 ENCOUNTER — Ambulatory Visit
Admission: RE | Admit: 2022-02-11 | Discharge: 2022-02-11 | Disposition: A | Discharge status: Home / Self Care | Payer: 59 | Source: Ambulatory Visit | Attending: Nurse Practitioner | Admitting: Nurse Practitioner

## 2022-02-11 VITALS — BP 122/77 | HR 88 | Temp 98.2°F | Resp 20

## 2022-02-11 DIAGNOSIS — R051 Acute cough: Secondary | ICD-10-CM | POA: Diagnosis not present

## 2022-02-11 DIAGNOSIS — R0602 Shortness of breath: Secondary | ICD-10-CM

## 2022-02-11 DIAGNOSIS — J449 Chronic obstructive pulmonary disease, unspecified: Secondary | ICD-10-CM | POA: Diagnosis present

## 2022-02-11 DIAGNOSIS — R509 Fever, unspecified: Secondary | ICD-10-CM | POA: Diagnosis not present

## 2022-02-11 MED ORDER — CEFUROXIME AXETIL 500 MG PO TABS: 500.0000 mg | ORAL_TABLET | Freq: Two times a day (BID) | ORAL | 0 refills | Status: AC

## 2022-02-11 MED ORDER — DOXYCYCLINE HYCLATE 100 MG PO CAPS: 100.0000 mg | ORAL_CAPSULE | Freq: Two times a day (BID) | ORAL | 0 refills | Status: AC

## 2022-02-11 NOTE — ED Provider Notes (Signed)
[facility]    CSN: [id number] Arrival date & time: 02/11/22  0945      History   Chief Complaint Chief Complaint  Patient presents with   Cough    HPI James Serrano is a 56 y.o. male.   Patient presents with wife to urgent care today for evaluation of cough, chest and sinus congestion, shortness of breath, low-grade fevers at home, coughing up thick, green mucus.  Denies chest pain, sore throat, ear pain or drainage.  Appetite has been slightly decreased and he has been a bit more tired than normal.  Reports history of lung cancer, has some shortness of breath and congestion at baseline, however it has significantly worsened the past couple of days.    He was seen via E-visit a couple of days ago and was prescribed steroids which have not seem to help much, however he is taking them.      Past Medical History:  Diagnosis Date   Anxiety    Asthma    Bronchitis    COPD (chronic obstructive pulmonary disease) ([place])    COVID-19 08/2020   GERD (gastroesophageal reflux disease)    History of radiation therapy    Right chest 12/25/21-01/07/22- Dr. Gery Pray   Lung cancer [facility]) 2023   Tobacco use     Patient Active Problem List   Diagnosis Date Noted   Pulmonary nodule 1 cm or greater in diameter 10/04/2021   Tobacco abuse 05/24/2021   Chronic obstructive pulmonary disease ([place]) 02/17/2014   Chest pain 02/12/2014    Past Surgical History:  Procedure Laterality Date   BRONCHIAL BIOPSY  11/12/2021   Procedure: BRONCHIAL BIOPSIES;  Surgeon: [name], DO;  Location: [facility];  Service: Pulmonary;;   BRONCHIAL BRUSHINGS  11/12/2021   Procedure: BRONCHIAL BRUSHINGS;  Surgeon: [name], DO;  Location: [facility];  Service: Pulmonary;;   BRONCHIAL NEEDLE ASPIRATION BIOPSY  11/12/2021   Procedure: BRONCHIAL NEEDLE ASPIRATION BIOPSIES;  Surgeon: [name], DO;  Location: [facility];  Service: Pulmonary;;   ORIF ACETABULAR  FRACTURE     TONSILLECTOMY         Home Medications    Prior to Admission medications   Medication Sig Start Date End Date Taking? Authorizing Provider  benzonatate (TESSALON) 100 MG capsule Take 1 capsule (100 mg total) by mouth 2 (two) times daily as needed for cough. 02/09/22  Yes Wurst, [place], PA-C  Budeson-Glycopyrrol-Formoterol (BREZTRI AEROSPHERE) 160-9-4.8 MCG/ACT AERO Inhale 2 puffs into the lungs in the morning and at bedtime. Patient taking differently: Inhale 2 puffs into the lungs daily. 10/04/21  Yes Rigoberto Noel, MD  cefUROXime (CEFTIN) 500 MG tablet Take 1 tablet (500 mg total) by mouth 2 (two) times daily with a meal for 7 days. 02/11/22 02/18/22 Yes Eulogio Bear, NP  doxycycline (VIBRAMYCIN) 100 MG capsule Take 1 capsule (100 mg total) by mouth 2 (two) times daily for 7 days. 02/11/22 02/18/22 Yes Eulogio Bear, NP  predniSONE (STERAPRED UNI-PAK 21 TAB) 10 MG (21) TBPK tablet Take by mouth daily. Take 6 tabs by mouth daily  for 2 days, then 5 tabs for 2 days, then 4 tabs for 2 days, then 3 tabs for 2 days, 2 tabs for 2 days, then 1 tab by mouth daily for 2 days 02/09/22  Yes Wurst, [place], PA-C  promethazine-dextromethorphan (PROMETHAZINE-DM) 6.25-15 MG/5ML syrup Take 5 mLs by mouth 4 (four) times daily as needed for cough. 01/11/22  Yes  Gildardo Pounds, NP  albuterol (VENTOLIN HFA) 108 (90 Base) MCG/ACT inhaler Inhale 2 puffs into the lungs every 4 (four) hours as needed. Patient taking differently: Inhale 2 puffs into the lungs 2 (two) times daily. 09/27/21   Kathyrn Drown, MD  alum & mag hydroxide-simeth (MAALOX/MYLANTA) 200-200-20 MG/5ML suspension Take 30 mLs by mouth every 6 (six) hours as needed for indigestion or heartburn.    [provider]  clonazePAM (KLONOPIN) 0.5 MG tablet Take 1 tablet (0.5 mg total) by mouth 2 (two) times daily as needed for anxiety. Patient not taking: Reported on 12/05/2021 09/07/20   Kathyrn Drown, MD  naproxen  (NAPROSYN) 500 MG tablet Take 1 tablet (500 mg total) by mouth 2 (two) times daily with a meal. 01/17/22   Carvel Getting, NP  pantoprazole (PROTONIX) 40 MG tablet Take 1 tablet (40 mg total) by mouth daily. Patient not taking: Reported on 12/05/2021 09/27/21   Kathyrn Drown, MD  sucralfate (CARAFATE) 1 g tablet Take 1 tablet (1 g total) by mouth 4 (four) times daily -  with meals and at bedtime. Crush and dissolve in 10 mL's of warm water prior to swallowing. Take 20 minutes before meals 01/07/22   Gery Pray, MD    Family History Family History  Problem Relation Age of Onset   Heart attack Father    Cancer Father    Cancer Brother    Cancer Mother     Social History Social History   Tobacco Use   Smoking status: Every Day    Packs/day: 1.00    Years: 30.00    Total pack years: 30.00    Types: Cigarettes   Smokeless tobacco: Never   Tobacco comments:    1 ppd 10/04/2021    1/2 ppd 11/01/2021  Substance Use Topics   Alcohol use: Yes    Comment: occasionally   Drug use: No     Allergies   Patient has no known allergies.   Review of Systems Review of Systems Per HPI  Physical Exam Triage Vital Signs ED Triage Vitals  Enc Vitals Group     BP 02/11/22 0959 122/77     Pulse Rate 02/11/22 0959 88     Resp 02/11/22 0959 20     Temp 02/11/22 0959 98.2 F (36.8 C)     Temp Source 02/11/22 0959 Oral     SpO2 02/11/22 0959 96 %     Weight --      Height --      Head Circumference --      Peak Flow --      Pain Score 02/11/22 1004 3     Pain Loc --      Pain Edu? --      Excl. in [place]? --    No data found.  Updated Vital Signs BP 122/77 (BP Location: Right Arm)   Pulse 88   Temp 98.2 F (36.8 C) (Oral)   Resp 20   SpO2 96%   Visual Acuity Right Eye Distance:   Left Eye Distance:   Bilateral Distance:    Right Eye Near:   Left Eye Near:    Bilateral Near:     Physical Exam Vitals and nursing note reviewed.  Constitutional:      General: He is not  in acute distress.    Appearance: Normal appearance. He is not ill-appearing, toxic-appearing or diaphoretic.  HENT:     Head: Normocephalic and atraumatic.  Mouth/Throat:     Mouth: Mucous membranes are moist.     Pharynx: Oropharynx is clear.  Cardiovascular:     Rate and Rhythm: Normal rate.  Pulmonary:     Effort: Pulmonary effort is normal. No respiratory distress.     Breath sounds: Examination of the left-upper field reveals wheezing. Wheezing present.  Chest:     Chest wall: No tenderness.  Musculoskeletal:     Cervical back: Normal range of motion.  Lymphadenopathy:     Cervical: No cervical adenopathy.  Skin:    General: Skin is warm.     Coloration: Skin is not jaundiced or pale.     Findings: No erythema.  Neurological:     Mental Status: He is alert and oriented to person, place, and time.  Psychiatric:        Behavior: Behavior is cooperative.      UC Treatments / Results  Labs (all labs ordered are listed, but only abnormal results are displayed) Labs Reviewed - No data to display  EKG   Radiology DG Chest 2 View  Result Date: 02/11/2022 CLINICAL DATA:  Cough, fever, shortness of breath EXAM: CHEST - 2 VIEW COMPARISON:  11/12/2021 FINDINGS: Cardiac size is within normal limits. Increase in AP diameter of chest suggests COPD. There are no signs of pulmonary edema or new focal infiltrates. There is no pleural effusion or pneumothorax. IMPRESSION: COPD. There are no signs of pulmonary edema or new focal infiltrates. Electronically Signed   By: Elmer Picker M.D.   On: 02/11/2022 10:53    Procedures Procedures (including critical care time)  Medications Ordered in UC Medications - No data to display  Initial Impression / Assessment and Plan / UC Course  I have reviewed the triage vital signs and the nursing notes.  Pertinent labs & imaging results that were available during my care of the patient were reviewed by me and considered in my medical  decision making (see chart for details).    Very pleasant, well-appearing 56 year old male presenting today with acute on chronic shortness of breath, cough, and low-grade fever at home.  He is wheezing in all lung lobes on examination and I am concerned about pneumonia in the upper left lobe.  Chest x-ray today shows COPD, no signs of pulmonary edema or new focal infiltrates.  However, given his symptoms, history of lung cancer and was recently treated with radiation, and examination I am concerned for an early pneumonia and will treat with doxycycline 100 mg twice daily for 7 days and Ceftin 500 mg twice daily for 7 days.  Encouraged close follow-up with primary care provider if symptoms persist despite treatment.  If symptoms worsen, encouraged him to go to emergency room. Final Clinical Impressions(s) / UC Diagnoses   Final diagnoses:  Shortness of breath  Acute cough  Fever, unspecified     Discharge Instructions      - Please go to [facility] and have the x-ray done immediately -Please start on the antibiotics-doxycycline 100 mg twice daily for 7 days and Ceftin 500 mg twice daily for 7 days to treat pneumonia -Seek care here or with your primary care doctor if your symptoms persist despite treatment -If your symptoms worsen, please go to the emergency room     ED Prescriptions     Medication Sig Dispense Auth. Provider   doxycycline (VIBRAMYCIN) 100 MG capsule Take 1 capsule (100 mg total) by mouth 2 (two) times daily for 7 days. 14 capsule Noemi Chapel  A, NP   cefUROXime (CEFTIN) 500 MG tablet Take 1 tablet (500 mg total) by mouth 2 (two) times daily with a meal for 7 days. 14 tablet Eulogio Bear, NP      PDMP not reviewed this encounter.   Eulogio Bear, NP 02/11/22 1135

## 2022-02-11 NOTE — Progress Notes (Signed)
I followed up on James Serrano schedule. He is set up for his follow up at this time.

## 2022-02-11 NOTE — ED Triage Notes (Signed)
Patient c/o productive cough w/ "green" x 3 days.   Patient endorses a temperature of 99.5 F at home.   Patient endorses SOB upon exertion.   Patient endorses worsening cough in the morning and at night.   Patient has been treated for lung cancer, finished radiation 1 month from today.  Patient had an evisit and was prescribed steroids with no relief of symptoms.

## 2022-02-11 NOTE — Discharge Instructions (Addendum)
-   Please go to [facility] and have the x-ray done immediately -Please start on the antibiotics-doxycycline 100 mg twice daily for 7 days and Ceftin 500 mg twice daily for 7 days to treat pneumonia -Seek care here or with your primary care doctor if your symptoms persist despite treatment -If your symptoms worsen, please go to the emergency room

## 2022-02-13 ENCOUNTER — Telehealth: Payer: Self-pay | Admitting: Pulmonary Disease

## 2022-02-13 DIAGNOSIS — R042 Hemoptysis: Secondary | ICD-10-CM

## 2022-02-13 NOTE — Telephone Encounter (Signed)
  Primary Pulmonologist: Dr. Elsworth Soho  Last office visit and with whom: Eric Form NP  11-22-2021 What do we see them for (pulmonary problems): COPD  Last OV assessment/plan: see below   Was appointment offered to patient (explain)?  No none available    Reason for call: Called and spoke with patients wife and she states that last week the patient started coughing a lot and they went for a CXR. Patient was prescribed doxycycline after CXR. She states for the last few days patient is coughing up very bright red blood she states sometimes it looks like a "spot of blood on the tissue" and sometimes it looks stringy but it is very bright. Patient has lung cancer and wife is concerned and wants to make sure this isn't something that they should get checked out by a provider.   Dr. Elsworth Soho is off so sending to DOD. Dr. Verlee Monte please advise thanks!   No Known Allergies  Immunization History  Administered Date(s) Administered   Moderna Sars-Covid-2 Vaccination 04/06/2020   Assessment/Plan 2 cm left upper lobe pulmonary nodule with increased uptake on PET/CT.  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 Julien Nordmann and Dr. Sondra Come as is scheduled.  Please work on quitting smoking.  Call 1-800-QUIT NOW for free nicotine patches , gum or mints.  There are classes available through [facility] for smoking cessation.    COPD Plan Continue Breztri and Albuterol for your COPD as you have been doing.  Follow up in 6 months with Dr. Elsworth Soho for COPD Call if you need [place] if you need [place] sooner.  Note your daily symptoms > remember "red flags" for COPD:  Increase in cough, increase in sputum production, increase in shortness of breath or activity intolerance. If you notice these symptoms, please call early to be seen.   Remember to call for early s/s of flare, as cancer treatment can suppress your immune  system Please contact office for sooner follow up if symptoms do not improve or worsen or seek emergency care     . I spent 40 minutes dedicated to the care of this patient on the date of this encounter to include pre-visit review of records, face-to-face time with the patient discussing conditions above, post visit ordering of testing, clinical documentation with the electronic health record, making appropriate referrals as documented, and communicating necessary information to the patient's healthcare team.      Magdalen Spatz, NP 11/22/2021  9:55 AM

## 2022-02-14 NOTE — Telephone Encounter (Signed)
I called pt and spoke to his wife (on dpr).  He wants his appts on Friday's due to work.  Can't go today.  I have him scheduled next Friday for CT.  She said he would wait to get follow up appt the next Friday on 7/7.  Only availability on 7/7 is [employer].  Pt has been scheduled.  Will route this message back to [facility] to make sure appts are ok.

## 2022-02-14 NOTE — Telephone Encounter (Signed)
Text Dr. Thora Lance to ask him if the scheduled appt was ok and he said yes that is fine. Cordelia Pen has been made aware. Nothing further needed at this time.

## 2022-02-18 ENCOUNTER — Encounter: Payer: Self-pay | Admitting: Radiation Oncology

## 2022-02-21 ENCOUNTER — Ambulatory Visit (INDEPENDENT_AMBULATORY_CARE_PROVIDER_SITE_OTHER)
Admission: RE | Admit: 2022-02-21 | Discharge: 2022-02-21 | Disposition: A | Discharge status: Home / Self Care | Payer: 59 | Source: Ambulatory Visit | Attending: Student | Admitting: Student

## 2022-02-21 DIAGNOSIS — R042 Hemoptysis: Secondary | ICD-10-CM | POA: Diagnosis not present

## 2022-02-22 NOTE — Progress Notes (Incomplete)
  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 On the date of his final treatment, the patient reported esophageal pain, fatigue, cough, and pain/difficulty swallowing. Accordingly, he was given carafate, and advised to use tylenol or advil with food.   Plan: The patient will follow-up with radiation oncology in one month .  ________________________________________________ -----------------------------------  Blair Promise, PhD, MD  This document serves as a record of services personally performed by Gery Pray, MD. It was created on his behalf by Roney Mans, a trained medical scribe. The creation of this record is based on the scribe's personal observations and the provider's statements to them. This document has been checked and approved by the attending provider.

## 2022-02-22 NOTE — Progress Notes (Signed)
Radiation Oncology         [phone] ________________________________  Name: James Serrano MRN: [id number]  Date: 02/24/2022  DOB: 02-Jan-1966  Follow-Up Visit Note  CC: Kathyrn Drown, MD  [name], DO  No diagnosis found.  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  The patient tolerated radiation therapy relatively well. On the date of his final treatment, the patient reported esophageal pain, fatigue, cough, and pain/difficulty swallowing. Accordingly, he was given carafate, and advised to use tylenol or advil with food.       Since completing radiation therapy, the patient presented to the ED on 02/11/22 for evaluation of cough productive of thick green mucus, chest and sinus congestion, increase in baseline shortness of breath, and low-grade fevers at home. Patient also endorsed a slight decrease in appetite and feeling more tired that normal. Several days before ED presentation, the patient was noted to have been prescribed steroids for his symptoms with minimal relief. ED examination revealed wheezing in all lung lobes, raising concern for pneumonia. Chest x-ray performed showed findings consistent with COPD, and no signs of pulmonary edema or new focal infiltrates.  However, given his symptoms, history of lung cancer and was recently treated with radiation, and examination, his presentation was consistently concerning for an early pneumonia and he was prescribed doxycycline 100 mg twice daily for 7 days, and Ceftin 500 mg twice daily for 7 days.  At discharge, the patient was encouraged to follow up with his PCP if his symptoms continued to persist  despite treatment, and return to the ED if his symptoms worsen.   His most recent chest CT performed on 02/21/22 showed: an interval decrease in size of the left upper lobe pulmonary neoplasm, expected post treatment (radiation) changes, stable small scattered bilateral peripheral pulmonary nodules, and stable emphysematous changes. CT showed no mediastinal or hilar mass or adenopathy, and no acute pulmonary infiltrates, pleural effusions, or new pulmonary lesions.   ***    Allergies:  has No Known Allergies.  Meds: Current Outpatient Medications  Medication Sig Dispense Refill   albuterol (VENTOLIN HFA) 108 (90 Base) MCG/ACT inhaler Inhale 2 puffs into the lungs every 4 (four) hours as needed. (Patient taking differently: Inhale 2 puffs into the lungs 2 (two) times daily.) 9 g 12   alum & mag hydroxide-simeth (MAALOX/MYLANTA) 200-200-20 MG/5ML suspension Take 30 mLs by mouth every 6 (six) hours as needed for indigestion or heartburn.     benzonatate (TESSALON) 100 MG capsule Take 1 capsule (100 mg total) by mouth 2 (two) times daily as needed for cough. 20 capsule 0   Budeson-Glycopyrrol-Formoterol (BREZTRI AEROSPHERE) 160-9-4.8 MCG/ACT AERO Inhale 2 puffs into the lungs in the morning and at bedtime. (Patient taking differently: Inhale 2 puffs into the lungs daily.) 10.7 g 11   clonazePAM (KLONOPIN) 0.5 MG tablet Take 1 tablet (0.5 mg total) by mouth 2 (two) times daily as needed for anxiety. (Patient not taking: Reported on 12/05/2021) 20 tablet 0   naproxen (NAPROSYN) 500 MG tablet Take 1 tablet (500 mg total) by mouth 2 (two) times daily with a meal. 14 tablet 0   pantoprazole (PROTONIX)  40 MG tablet Take 1 tablet (40 mg total) by mouth daily. (Patient not taking: Reported on 12/05/2021) 30 tablet 5   predniSONE (STERAPRED UNI-PAK 21 TAB) 10 MG (21) TBPK tablet Take by mouth daily. Take 6 tabs by mouth daily  for 2 days, then 5 tabs for 2 days, then 4 tabs for 2 days, then 3 tabs for 2 days,  2 tabs for 2 days, then 1 tab by mouth daily for 2 days 42 tablet 0   promethazine-dextromethorphan (PROMETHAZINE-DM) 6.25-15 MG/5ML syrup Take 5 mLs by mouth 4 (four) times daily as needed for cough. 180 mL 0   sucralfate (CARAFATE) 1 g tablet Take 1 tablet (1 g total) by mouth 4 (four) times daily -  with meals and at bedtime. Crush and dissolve in 10 mL's of warm water prior to swallowing. Take 20 minutes before meals 120 tablet 1   No current facility-administered medications for this encounter.    Physical Findings: The patient is in no acute distress. Patient is alert and oriented.  vitals were not taken for this visit. .  No significant changes. Lungs are clear to auscultation bilaterally. Heart has regular rate and rhythm. No palpable cervical, supraclavicular, or axillary adenopathy. Abdomen soft, non-tender, normal bowel sounds. ***   Lab Findings: Lab Results  Component Value Date   WBC 5.1 12/05/2021   HGB 16.7 12/05/2021   HCT 48.3 12/05/2021   MCV 98.2 12/05/2021   PLT 170 12/05/2021    Radiographic Findings: CT Chest Wo Contrast  Result Date: 02/22/2022 CLINICAL DATA:  Hemoptysis. Recently finished radiation therapy. EXAM: CT CHEST WITHOUT CONTRAST TECHNIQUE: Multidetector CT imaging of the chest was performed following the standard protocol without IV contrast. RADIATION DOSE REDUCTION: This exam was performed according to the departmental dose-optimization program which includes automated exposure control, adjustment of the mA and/or kV according to patient size and/or use of iterative reconstruction technique. COMPARISON:  Multiple prior imaging studies. The most recent chest CT is 11/08/2021. Recent PET-CT 09/27/2021 FINDINGS: Cardiovascular: The heart is normal in size. No pericardial effusion. Stable scattered aortic calcifications and extensive age advanced coronary artery calcifications. Mediastinum/Nodes: No mediastinal or hilar mass or lymphadenopathy. Small scattered  lymph nodes are stable. The esophagus is grossly normal. Lungs/Pleura: Stable underlying emphysematous changes. No acute pulmonary infiltrates, pleural effusions or new pulmonary lesions. Expected post treatment changes involving the left upper lobe neoplasm. This previously measured approximately 2.2 x 1.6 cm and now measures 15 x 12 mm. Some surrounding interstitial changes consistent with radiation pneumonitis. Unchanged small scattered peripheral pulmonary nodules in both lungs which will require continued observation. The largest nodule in the left lower lobe measures 7 mm on image 116/3 and the largest nodule in the right lower lobe measures 5 mm on image 110/3 Upper Abdomen: No significant upper abdominal findings. No hepatic or adrenal gland lesions. Musculoskeletal: No chest wall mass, supraclavicular or axillary adenopathy. The bony thorax is intact. IMPRESSION: 1. Interval decrease in size of the left upper lobe pulmonary neoplasm. Expected post treatment (radiation) changes. 2. No mediastinal or hilar mass or adenopathy. 3. Stable small scattered bilateral peripheral pulmonary nodules. 4. Stable emphysematous changes. 5. No acute pulmonary infiltrates, pleural effusions or new pulmonary lesions. 6. Stable age advanced coronary artery calcifications. Aortic Atherosclerosis (ICD10-I70.0) and Emphysema (ICD10-J43.9). Electronically Signed   By: Marijo Sanes M.D.   On: 02/22/2022 10:39   DG Chest 2 View  Result Date: 02/11/2022 CLINICAL DATA:  Cough, fever, shortness of breath EXAM: CHEST -  2 VIEW COMPARISON:  11/12/2021 FINDINGS: Cardiac size is within normal limits. Increase in AP diameter of chest suggests COPD. There are no signs of pulmonary edema or new focal infiltrates. There is no pleural effusion or pneumothorax. IMPRESSION: COPD. There are no signs of pulmonary edema or new focal infiltrates. Electronically Signed   By: Elmer Picker M.D.   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  ***  Plan:  ***   *** minutes of total time was spent for this patient encounter, including preparation, face-to-face counseling with the patient and coordination of care, physical exam, and documentation of the encounter. ____________________________________  Blair Promise, PhD, MD  This document serves as a record of services personally performed by Gery Pray, MD. It was created on his behalf by Roney Mans, a trained medical scribe. The creation of this record is based on the scribe's personal observations and the provider's statements to them. This document has been checked and approved by the attending provider.

## 2022-02-24 ENCOUNTER — Encounter: Payer: Self-pay | Admitting: Medical Oncology

## 2022-02-24 ENCOUNTER — Encounter: Payer: Self-pay | Admitting: Radiation Oncology

## 2022-02-24 ENCOUNTER — Other Ambulatory Visit: Payer: Self-pay | Admitting: Medical Oncology

## 2022-02-24 ENCOUNTER — Ambulatory Visit
Admission: RE | Admit: 2022-02-24 | Discharge: 2022-02-24 | Disposition: A | Discharge status: Home / Self Care | Payer: 59 | Source: Ambulatory Visit | Attending: Radiation Oncology | Admitting: Radiation Oncology

## 2022-02-24 ENCOUNTER — Other Ambulatory Visit: Payer: Self-pay

## 2022-02-24 DIAGNOSIS — C3490 Malignant neoplasm of unspecified part of unspecified bronchus or lung: Secondary | ICD-10-CM

## 2022-02-24 DIAGNOSIS — C3431 Malignant neoplasm of lower lobe, right bronchus or lung: Secondary | ICD-10-CM | POA: Insufficient documentation

## 2022-02-24 DIAGNOSIS — Z791 Long term (current) use of non-steroidal anti-inflammatories (NSAID): Secondary | ICD-10-CM | POA: Diagnosis not present

## 2022-02-24 DIAGNOSIS — J439 Emphysema, unspecified: Secondary | ICD-10-CM | POA: Diagnosis not present

## 2022-02-24 DIAGNOSIS — I700 Atherosclerosis of aorta: Secondary | ICD-10-CM | POA: Insufficient documentation

## 2022-02-24 DIAGNOSIS — C3412 Malignant neoplasm of upper lobe, left bronchus or lung: Secondary | ICD-10-CM | POA: Diagnosis not present

## 2022-02-24 DIAGNOSIS — R911 Solitary pulmonary nodule: Secondary | ICD-10-CM

## 2022-02-24 DIAGNOSIS — Z79899 Other long term (current) drug therapy: Secondary | ICD-10-CM | POA: Insufficient documentation

## 2022-02-24 DIAGNOSIS — Z923 Personal history of irradiation: Secondary | ICD-10-CM | POA: Insufficient documentation

## 2022-02-24 MED ORDER — HYDROCOD POLI-CHLORPHE POLI ER 10-8 MG/5ML PO SUER: 5.0000 mL | Freq: Every evening | ORAL | 0 refills | Status: DC | PRN

## 2022-02-24 NOTE — Progress Notes (Signed)
James Serrano is here today for follow up post radiation to the lung.  Lung Side: Left, patient completed treatment on 01/07/22.  Does the patient complain of any of the following: Pain:No Shortness of breath w/wo exertion: Yes at all times.  Cough: Yes,patient reports increased coughing.  Hemoptysis: Yes, mostly in the morning. Patient had episode this morning.  Pain with swallowing: No Swallowing/choking concerns: No Appetite: Good Weight :  Wt Readings from Last 3 Encounters:  02/24/22 240 lb 9.6 oz (109.1 kg)  12/05/21 247 lb 6.4 oz (112.2 kg)  12/05/21 247 lb 11.2 oz (112.4 kg)    Energy Level:  Continues to have a low energy level.  Post radiation skin Changes:  No    Additional comments if applicable: Patient had recent CT scan  on 02/21/22 due patient coughing up a copious amount of blood. Patient states blood was dark.

## 2022-02-24 NOTE — Telephone Encounter (Signed)
Joellen Jersey could you please advise on following My Chart message as you are one that will be seeing pt on Friday?    I am supposed to see you Friday for results from CT scan. I am wondering to save me a trip can Dr Rogue Jury go over the results with me next week? If so can you please cancel my appointment this Friday? Thank you James Serrano   Thank you

## 2022-02-26 NOTE — Telephone Encounter (Signed)
Everything looked stable on his CT so as long as he is not still having acute respiratory symptoms with dyspnea and coughing up blood (previously reported), fine with me to reschedule. Thanks.

## 2022-02-28 ENCOUNTER — Ambulatory Visit: Payer: 59 | Admitting: Nurse Practitioner

## 2022-03-03 ENCOUNTER — Telehealth: Payer: Self-pay | Admitting: Pulmonary Disease

## 2022-03-03 DIAGNOSIS — R0602 Shortness of breath: Secondary | ICD-10-CM

## 2022-03-03 DIAGNOSIS — J449 Chronic obstructive pulmonary disease, unspecified: Secondary | ICD-10-CM

## 2022-03-03 MED ORDER — PREDNISONE 10 MG PO TABS: ORAL_TABLET | ORAL | 0 refills | Status: AC

## 2022-03-03 NOTE — Telephone Encounter (Addendum)
Called and spoke to [place] (ok per dpr) and notified her of recs from Dr. Elsworth Soho. Orders placed and nothing further needed at this time.

## 2022-03-03 NOTE — Telephone Encounter (Signed)
Primary Pulmonologist: Dr. Elsworth Soho  Last office visit and with whom: Eric Form NP 11-22-2021 What do we see them for (pulmonary problems): COPD, severe/ Squamous cell carcinoma R Lung  Last OV assessment/plan: see below   Was appointment offered to patient (explain)?  Patient has an appt 7/18   Reason for call: Called and spoke to [name] (ok per dpr) and she states she believes patient has another URI. She states he is getting SOB and seems like he is working harder to breathe during coughing fits. She states he has not checked his O2 sats. Patient is coughing up dark red blood still and she states he feels like he has a lot of chest congestion but is only coughing up the blood. She would like to know if patient can have prednisone sent in for him.   Please advise   No Known Allergies  Immunization History  Administered Date(s) Administered   Moderna Sars-Covid-2 Vaccination 04/06/2020    Assessment/Plan 2 cm left upper lobe pulmonary nodule with increased uptake on PET/CT.  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 Julien Nordmann and Dr. Sondra Come as is scheduled.  Please work on quitting smoking.  Call 1-800-QUIT NOW for free nicotine patches , gum or mints.  There are classes available through [facility] for smoking cessation.    COPD Plan Continue Breztri and Albuterol for your COPD as you have been doing.  Follow up in 6 months with Dr. Elsworth Soho for COPD Call if you need [place] if you need [place] sooner.  Note your daily symptoms > remember "red flags" for COPD:  Increase in cough, increase in sputum production, increase in shortness of breath or activity intolerance. If you notice these symptoms, please call early to be seen.   Remember to call for early s/s of flare, as cancer treatment can suppress your immune system Please contact office for sooner follow up if symptoms do not improve or  worsen or seek emergency care     . I spent 40 minutes dedicated to the care of this patient on the date of this encounter to include pre-visit review of records, face-to-face time with the patient discussing conditions above, post visit ordering of testing, clinical documentation with the electronic health record, making appropriate referrals as documented, and communicating necessary information to the patient's healthcare team.      Magdalen Spatz, NP 11/22/2021  9:55 AM                        Patient Instructions by Magdalen Spatz, NP at 11/22/2021 10:00 AM  Author: Magdalen Spatz, NP Author Type: Nurse Practitioner Filed: 11/22/2021 10:21 AM  Note Status: Addendum Cosign: Cosign Not Required Encounter Date: 11/22/2021  Editor: Magdalen Spatz, NP (Nurse Practitioner)      Prior Versions: 1. Magdalen Spatz, NP (Nurse Practitioner) at 11/22/2021 10:14 AM - Signed  It is good to see you today. Continue Breztri and Albuterol for your COPD as you have been doing.  Follow up with Dr. Julien Nordmann and Dr. Sondra Come as is scheduled.  Please work on quitting smoking.  Call 1-800-QUIT NOW for free nicotine patches , gum or mints.  There are classes available through [facility] for smoking cessation.  Follow up in 6 months with Dr. Elsworth Soho for COPD Call if you need [place] if you need [place] sooner.  Note your  daily symptoms > remember "red flags" for COPD:  Increase in cough, increase in sputum production, increase in shortness of breath or activity intolerance. If you notice these symptoms, please call early to be seen.   Please contact office for sooner follow up if symptoms do not improve or worsen or seek emergency care     .

## 2022-03-04 ENCOUNTER — Inpatient Hospital Stay: Payer: 59

## 2022-03-06 ENCOUNTER — Inpatient Hospital Stay: Payer: 59

## 2022-03-06 ENCOUNTER — Inpatient Hospital Stay: Payer: 59 | Admitting: Internal Medicine

## 2022-03-17 ENCOUNTER — Ambulatory Visit
Admission: RE | Admit: 2022-03-17 | Discharge: 2022-03-17 | Disposition: A | Discharge status: Home / Self Care | Payer: 59 | Source: Ambulatory Visit | Attending: Family Medicine | Admitting: Family Medicine

## 2022-03-17 VITALS — BP 160/92 | HR 92 | Temp 98.2°F | Resp 16

## 2022-03-17 DIAGNOSIS — M5431 Sciatica, right side: Secondary | ICD-10-CM | POA: Diagnosis not present

## 2022-03-17 MED ORDER — CYCLOBENZAPRINE HCL 10 MG PO TABS: 10.0000 mg | ORAL_TABLET | Freq: Three times a day (TID) | ORAL | 0 refills | Status: DC | PRN

## 2022-03-17 MED ORDER — PREDNISONE 20 MG PO TABS: 40.0000 mg | ORAL_TABLET | Freq: Every day | ORAL | 0 refills | Status: DC

## 2022-03-17 NOTE — ED Provider Notes (Signed)
[facility]    CSN: [id number] Arrival date & time: 03/17/22  1533      History   Chief Complaint Chief Complaint  Patient presents with   Back Pain    Have had lung cancer radiation - Entered by patient   Appointment    HPI James Serrano is a 56 y.o. male.   Patient presenting today with 2-day history of right-sided low back pain radiating down the back of his right leg and into hip.  Denies injury to the area but states he broke his back when he was young and has episodic flareups just like this.  Denies numbness, tingling, weakness, bowel or bladder incontinence, saddle anesthesia, fevers.  Trying over-the-counter pain relievers with no relief.    Past Medical History:  Diagnosis Date   Anxiety    Asthma    Bronchitis    COPD (chronic obstructive pulmonary disease) ([place])    COVID-19 08/2020   GERD (gastroesophageal reflux disease)    History of radiation therapy    Right chest 12/25/21-01/07/22- Dr. Gery Pray   History of radiation therapy    Left Lung- 12/25/21-01/07/22- Dr. Gery Pray   Lung cancer [facility]) 2023   Tobacco use     Patient Active Problem List   Diagnosis Date Noted   Pulmonary nodule 1 cm or greater in diameter 10/04/2021   Tobacco abuse 05/24/2021   Chronic obstructive pulmonary disease ([place]) 02/17/2014   Chest pain 02/12/2014    Past Surgical History:  Procedure Laterality Date   BRONCHIAL BIOPSY  11/12/2021   Procedure: BRONCHIAL BIOPSIES;  Surgeon: [name], DO;  Location: [facility];  Service: Pulmonary;;   BRONCHIAL BRUSHINGS  11/12/2021   Procedure: BRONCHIAL BRUSHINGS;  Surgeon: [name], DO;  Location: [facility];  Service: Pulmonary;;   BRONCHIAL NEEDLE ASPIRATION BIOPSY  11/12/2021   Procedure: BRONCHIAL NEEDLE ASPIRATION BIOPSIES;  Surgeon: [name], DO;  Location: [facility];  Service: Pulmonary;;   ORIF ACETABULAR FRACTURE     TONSILLECTOMY         Home Medications     Prior to Admission medications   Medication Sig Start Date End Date Taking? Authorizing Provider  cyclobenzaprine (FLEXERIL) 10 MG tablet Take 1 tablet (10 mg total) by mouth 3 (three) times daily as needed for muscle spasms. Do not drink alcohol or drive while taking this medication.  May cause drowsiness. 03/17/22  Yes Volney American, PA-C  predniSONE (DELTASONE) 20 MG tablet Take 2 tablets (40 mg total) by mouth daily with breakfast. 03/17/22  Yes Volney American, PA-C  albuterol (VENTOLIN HFA) 108 (90 Base) MCG/ACT inhaler Inhale 2 puffs into the lungs every 4 (four) hours as needed. Patient taking differently: Inhale 2 puffs into the lungs 2 (two) times daily. 09/27/21   Kathyrn Drown, MD  alum & mag hydroxide-simeth (MAALOX/MYLANTA) 200-200-20 MG/5ML suspension Take 30 mLs by mouth every 6 (six) hours as needed for indigestion or heartburn.    [provider]  benzonatate (TESSALON) 100 MG capsule Take 1 capsule (100 mg total) by mouth 2 (two) times daily as needed for cough. 02/09/22   Wurst, [place], PA-C  Budeson-Glycopyrrol-Formoterol (BREZTRI AEROSPHERE) 160-9-4.8 MCG/ACT AERO Inhale 2 puffs into the lungs in the morning and at bedtime. Patient taking differently: Inhale 2 puffs into the lungs daily. 10/04/21   Rigoberto Noel, MD  chlorpheniramine-HYDROcodone 10-8 MG/5ML Take 5 mLs by mouth at bedtime as needed for cough. 02/24/22   Kinard,  Jeneen Rinks, MD  clonazePAM (KLONOPIN) 0.5 MG tablet Take 1 tablet (0.5 mg total) by mouth 2 (two) times daily as needed for anxiety. 09/07/20   Kathyrn Drown, MD  naproxen (NAPROSYN) 500 MG tablet Take 1 tablet (500 mg total) by mouth 2 (two) times daily with a meal. 01/17/22   Carvel Getting, NP  pantoprazole (PROTONIX) 40 MG tablet Take 1 tablet (40 mg total) by mouth daily. 09/27/21   Kathyrn Drown, MD  predniSONE (DELTASONE) 10 MG tablet Take 4 tablets (40 mg total) by mouth daily with breakfast for 4 days, THEN 3 tablets (30 mg  total) daily with breakfast for 4 days, THEN 2 tablets (20 mg total) daily with breakfast for 4 days, THEN 1 tablet (10 mg total) daily with breakfast for 4 days. 03/03/22 03/19/22  Rigoberto Noel, MD  promethazine-dextromethorphan (PROMETHAZINE-DM) 6.25-15 MG/5ML syrup Take 5 mLs by mouth 4 (four) times daily as needed for cough. Patient not taking: Reported on 02/24/2022 01/11/22   Gildardo Pounds, NP  sucralfate (CARAFATE) 1 g tablet Take 1 tablet (1 g total) by mouth 4 (four) times daily -  with meals and at bedtime. Crush and dissolve in 10 mL's of warm water prior to swallowing. Take 20 minutes before meals Patient not taking: Reported on 02/24/2022 01/07/22   Gery Pray, MD    Family History Family History  Problem Relation Age of Onset   Heart attack Father    Cancer Father    Cancer Brother    Cancer Mother     Social History Social History   Tobacco Use   Smoking status: Every Day    Packs/day: 1.00    Years: 30.00    Total pack years: 30.00    Types: Cigarettes   Smokeless tobacco: Never   Tobacco comments:    1 ppd 10/04/2021    1/2 ppd 11/01/2021  Substance Use Topics   Alcohol use: Yes    Comment: occasionally   Drug use: No     Allergies   Patient has no known allergies.   Review of Systems Review of Systems Per HPI  Physical Exam Triage Vital Signs ED Triage Vitals  Enc Vitals Group     BP 03/17/22 1631 (!) 160/92     Pulse Rate 03/17/22 1631 92     Resp 03/17/22 1631 16     Temp 03/17/22 1631 98.2 F (36.8 C)     Temp Source 03/17/22 1631 Oral     SpO2 03/17/22 1631 95 %     Weight --      Height --      Head Circumference --      Peak Flow --      Pain Score 03/17/22 1646 6     Pain Loc --      Pain Edu? --      Excl. in [place]? --    No data found.  Updated Vital Signs BP (!) 160/92 (BP Location: Right Arm)   Pulse 92   Temp 98.2 F (36.8 C) (Oral)   Resp 16   SpO2 95%   Visual Acuity Right Eye Distance:   Left Eye Distance:    Bilateral Distance:    Right Eye Near:   Left Eye Near:    Bilateral Near:     Physical Exam Vitals and nursing note reviewed.  Constitutional:      Appearance: Normal appearance.     Comments: Standing in the exam room as sitting  is too painful  HENT:     Head: Atraumatic.  Eyes:     Extraocular Movements: Extraocular movements intact.     Conjunctiva/sclera: Conjunctivae normal.  Cardiovascular:     Rate and Rhythm: Normal rate and regular rhythm.  Pulmonary:     Effort: Pulmonary effort is normal.     Breath sounds: Normal breath sounds.  Musculoskeletal:        General: Tenderness present. No swelling, deformity or signs of injury. Normal range of motion.     Cervical back: Normal range of motion and neck supple.     Comments: Right lumbar musculature tender to palpation.  No midline spinal tenderness to palpation diffusely.  Negative straight leg raise bilateral lower extremities.  Skin:    General: Skin is warm and dry.  Neurological:     General: No focal deficit present.     Mental Status: He is oriented to person, place, and time.     Comments: Bilateral lower extremities neurovascularly intact  Psychiatric:        Mood and Affect: Mood normal.        Thought Content: Thought content normal.        Judgment: Judgment normal.      UC Treatments / Results  Labs (all labs ordered are listed, but only abnormal results are displayed) Labs Reviewed - No data to display  EKG   Radiology No results found.  Procedures Procedures (including critical care time)  Medications Ordered in UC Medications - No data to display  Initial Impression / Assessment and Plan / UC Course  I have reviewed the triage vital signs and the nursing notes.  Pertinent labs & imaging results that were available during my care of the patient were reviewed by me and considered in my medical decision making (see chart for details).     Treat with prednisone, Flexeril, heat,  massage, Epsom salt soaks.  Return for worsening symptoms.  No red flag findings today.  Final Clinical Impressions(s) / UC Diagnoses   Final diagnoses:  Right sided sciatica   Discharge Instructions   None    ED Prescriptions     Medication Sig Dispense Auth. Provider   predniSONE (DELTASONE) 20 MG tablet Take 2 tablets (40 mg total) by mouth daily with breakfast. 10 tablet Volney American, PA-C   cyclobenzaprine (FLEXERIL) 10 MG tablet Take 1 tablet (10 mg total) by mouth 3 (three) times daily as needed for muscle spasms. Do not drink alcohol or drive while taking this medication.  May cause drowsiness. 15 tablet Volney American, [place]      PDMP not reviewed this encounter.   Volney American, [place] 03/17/22 1701

## 2022-03-17 NOTE — ED Triage Notes (Signed)
Pt presents with lower back pain that began on Saturday. Denies injury , pain radiates into right hip

## 2022-03-19 ENCOUNTER — Ambulatory Visit (HOSPITAL_COMMUNITY)
Admission: RE | Admit: 2022-03-19 | Discharge: 2022-03-19 | Disposition: A | Discharge status: Home / Self Care | Payer: 59 | Source: Ambulatory Visit | Attending: Pulmonary Disease | Admitting: Pulmonary Disease

## 2022-03-19 ENCOUNTER — Telehealth: Payer: Self-pay | Admitting: Internal Medicine

## 2022-03-19 DIAGNOSIS — R0602 Shortness of breath: Secondary | ICD-10-CM | POA: Insufficient documentation

## 2022-03-19 DIAGNOSIS — J449 Chronic obstructive pulmonary disease, unspecified: Secondary | ICD-10-CM | POA: Insufficient documentation

## 2022-03-19 NOTE — Telephone Encounter (Signed)
.  Called pt per 07 inbasket , Patient was unavailable, a message with appt time and date was left with number on file.

## 2022-03-21 ENCOUNTER — Ambulatory Visit (INDEPENDENT_AMBULATORY_CARE_PROVIDER_SITE_OTHER): Payer: 59 | Admitting: Pulmonary Disease

## 2022-03-21 ENCOUNTER — Encounter: Payer: Self-pay | Admitting: Pulmonary Disease

## 2022-03-21 VITALS — BP 132/78 | HR 86 | Temp 98.2°F | Ht 72.0 in | Wt 247.4 lb

## 2022-03-21 DIAGNOSIS — J449 Chronic obstructive pulmonary disease, unspecified: Secondary | ICD-10-CM

## 2022-03-21 DIAGNOSIS — J432 Centrilobular emphysema: Secondary | ICD-10-CM

## 2022-03-21 DIAGNOSIS — C3490 Malignant neoplasm of unspecified part of unspecified bronchus or lung: Secondary | ICD-10-CM | POA: Diagnosis not present

## 2022-03-21 MED ORDER — ALBUTEROL SULFATE (2.5 MG/3ML) 0.083% IN NEBU: 2.5000 mg | INHALATION_SOLUTION | Freq: Four times a day (QID) | RESPIRATORY_TRACT | 3 refills | Status: DC | PRN

## 2022-03-21 MED ORDER — AMOXICILLIN-POT CLAVULANATE 875-125 MG PO TABS: 1.0000 | ORAL_TABLET | Freq: Two times a day (BID) | ORAL | 0 refills | Status: AC

## 2022-03-21 MED ORDER — PREDNISONE 10 MG PO TABS: ORAL_TABLET | ORAL | 0 refills | Status: AC

## 2022-03-21 NOTE — Assessment & Plan Note (Signed)
I would favor synchronous bilateral squamous stage I cancer rather than metastatic but would await oncology input about this. He has undergone radiation treatment to about and 102-month follow-up scan appears without any evidence of recurrent disease.  Repeat CT scan will be planned for October and he is due to see oncology then, I will defer to oncology if they want to see him sooner especially if they are contemplating additional therapy

## 2022-03-21 NOTE — Assessment & Plan Note (Signed)
We will treat him as an exacerbation with a course of antibiotic and steroids. We will use Augmentin for 7 days and prednisone for 2 weeks. Alternative cause of interstitial infiltrates could be radiation pneumonitis and prednisone will work for that too. He will continue on Breztri  Prescription for albuterol nebs and a new nebulizer was provided

## 2022-03-21 NOTE — Progress Notes (Signed)
   Subjective:    Patient ID: James Serrano, male    DOB: 1966/04/03, 56 y.o.   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 PET  showed hypermetabolism in this left upper lobe nodule with SUV 10.8, there was some activity of mesenteric vessels also noted   Family history of lung cancer in his dad, brother died of sarcoma metastatic to lungs, mom has an unknown kind of cancer and also had asthma.  Chief Complaint  Patient presents with   Follow-up    CXR done 7/25 wants to talk about nebulizer machine and getting oxygen. Lots of trouble with coughing and breathing    Unfortunately, bronchoscopy showed an additional lesion in the right lower lobe which was also squamous cancer.  He underwent SBRT to the left upper lobe lung nodule in addition to ultra hypofractionated treatment to the right lower lobe endobronchial lesion.SBRT to the left upper lobe lung nodule in addition to ultra hypofractionated treatment to the right lower lobe endobronchial lesion.  CT chest  wo con 7/1 reviewed  -showed expected radiation changes. He called 7/10 for hemoptysis and worsening dyspnea and was given a course of prednisone.  He reports yellow sputum and increased shortness of breath.  He works as a Scientist, product/process development and the heat is getting to him  Chest x-ray 7/26 was reviewed shows bibasal interstitial infiltrates Significant tests/ events reviewed    11/12/21 video bronchoscopy with robotic assisted bronchoscopic navigation.  He had biopsy of the left upper lobe lung nodule but there was also a right lower lobe endobronchial lesion    LDCT 08/2021 Dominant 19.7 mm central left upper lobe irregular pulmonary nodule   PFTs 09/13/2021 severe airway obstruction, ratio 48, FEV1 1.35/34%, FVC 53%, TLC 1 1 3%, DLCO 92%/27.7 ,  postbronchodilator FEV1 was 1.47  Review of Systems neg for any significant sore throat, dysphagia, itching, sneezing, nasal congestion or excess/ purulent secretions, fever, chills, sweats, unintended wt loss, pleuritic or exertional cp, hempoptysis, orthopnea pnd or change in chronic leg swelling. Also denies presyncope, palpitations, heartburn, abdominal pain, nausea, vomiting, diarrhea or change in bowel or urinary habits, dysuria,hematuria, rash, arthralgias, visual complaints, headache, numbness weakness or ataxia.     Objective:   Physical Exam  Gen. Pleasant, well-nourished, in no distress, normal affect ENT - no pallor,icterus, no post nasal drip Neck: No JVD, no thyromegaly, no carotid bruits Lungs: no use of accessory muscles, no dullness to percussion, clear without rales or rhonchi  Cardiovascular: Rhythm regular, heart sounds  normal, no murmurs or gallops, no peripheral edema Abdomen: soft and non-tender, no hepatosplenomegaly, BS normal. Musculoskeletal: No deformities, no cyanosis or clubbing Neuro:  alert, non focal       Assessment & Plan:

## 2022-03-21 NOTE — Patient Instructions (Signed)
   X Rx Augmentin 875 bid x 7 days  Prednisone 10 mg tabs  Take 2 tabs daily with food x 7ds, then 1 tab daily with food x 7ds then STOP  X Rx for nebuliser + albuterol nebs every 6-8 h as needed #60 x 3 refills

## 2022-03-28 ENCOUNTER — Ambulatory Visit: Payer: Self-pay | Admitting: Family Medicine

## 2022-04-06 ENCOUNTER — Other Ambulatory Visit: Payer: Self-pay

## 2022-04-06 ENCOUNTER — Emergency Department (HOSPITAL_COMMUNITY)
Admission: EM | Admit: 2022-04-06 | Discharge: 2022-04-06 | Discharge status: Against Medical Advice | Payer: 59 | Attending: Emergency Medicine | Admitting: Emergency Medicine

## 2022-04-06 ENCOUNTER — Emergency Department (HOSPITAL_COMMUNITY): Payer: 59

## 2022-04-06 ENCOUNTER — Encounter (HOSPITAL_COMMUNITY): Payer: Self-pay

## 2022-04-06 DIAGNOSIS — R059 Cough, unspecified: Secondary | ICD-10-CM | POA: Diagnosis present

## 2022-04-06 DIAGNOSIS — J441 Chronic obstructive pulmonary disease with (acute) exacerbation: Secondary | ICD-10-CM | POA: Diagnosis not present

## 2022-04-06 DIAGNOSIS — Z85118 Personal history of other malignant neoplasm of bronchus and lung: Secondary | ICD-10-CM | POA: Insufficient documentation

## 2022-04-06 DIAGNOSIS — Z7952 Long term (current) use of systemic steroids: Secondary | ICD-10-CM | POA: Diagnosis not present

## 2022-04-06 DIAGNOSIS — Z7951 Long term (current) use of inhaled steroids: Secondary | ICD-10-CM | POA: Diagnosis not present

## 2022-04-06 DIAGNOSIS — Z5329 Procedure and treatment not carried out because of patient's decision for other reasons: Secondary | ICD-10-CM | POA: Diagnosis not present

## 2022-04-06 MED ORDER — IPRATROPIUM BROMIDE 0.02 % IN SOLN
0.5000 mg | Freq: Once | RESPIRATORY_TRACT | Status: AC
  Administered 2022-04-06: 0.5 mg via RESPIRATORY_TRACT
  Filled 2022-04-06: qty 2.5

## 2022-04-06 MED ORDER — PREDNISONE 50 MG PO TABS
60.0000 mg | ORAL_TABLET | Freq: Once | ORAL | Status: AC
  Administered 2022-04-06: 60 mg via ORAL
  Filled 2022-04-06: qty 1

## 2022-04-06 MED ORDER — ALBUTEROL (5 MG/ML) CONTINUOUS INHALATION SOLN
10.0000 mg/h | INHALATION_SOLUTION | Freq: Once | RESPIRATORY_TRACT | Status: DC
  Filled 2022-04-06: qty 20

## 2022-04-06 MED ORDER — ALBUTEROL SULFATE (2.5 MG/3ML) 0.083% IN NEBU
INHALATION_SOLUTION | RESPIRATORY_TRACT | Status: AC
  Administered 2022-04-06: 2.5 mg
  Filled 2022-04-06: qty 12

## 2022-04-06 NOTE — ED Notes (Signed)
MD Hillard Danker was made ware that patient would like to leave. Pt plans to leave AMA and not wait for discharge papers. Pt is alert, oriented, and ambulatory upon leaving. Bryson Corona Edd Fabian

## 2022-04-06 NOTE — ED Triage Notes (Signed)
Pov from home. Cc of cough + congestion for a few days that makes it hard to breathe at times. Getting radiation for lung cancer as well.

## 2022-04-06 NOTE — ED Provider Notes (Signed)
[facility] EMERGENCY DEPARTMENT Provider Note   CSN: [id number] Arrival date & time: 04/06/22  1940     History  Chief Complaint  Patient presents with   Cough    James Serrano is a 56 y.o. male.   Cough    Patient has a history of COPD, cancer, reflux.  Patient previously treated with radiation treatment.  Patient states he is scheduled to follow-up in October with his oncologist.  States the last few days he has had problems with cough and congestion.  Patient did see his pulmonary doctor on July 28.  He was treated with a course of antibiotics and steroids at that time.  Patient states he has been tapering down on his steroids.  According to his outpatient records he should be on 10 mg/day at this time.  Patient has noticed increasing wheezing and congestion the last couple days.  He has been feeling short of breath today.  No fevers.  No leg swelling.  Home Medications Prior to Admission medications   Medication Sig Start Date End Date Taking? Authorizing Provider  albuterol (PROVENTIL) (2.5 MG/3ML) 0.083% nebulizer solution Take 3 mLs (2.5 mg total) by nebulization every 6 (six) hours as needed for wheezing or shortness of breath. 03/21/22   Rigoberto Noel, MD  albuterol (VENTOLIN HFA) 108 (90 Base) MCG/ACT inhaler Inhale 2 puffs into the lungs every 4 (four) hours as needed. Patient taking differently: Inhale 2 puffs into the lungs 2 (two) times daily. 09/27/21   Kathyrn Drown, MD  alum & mag hydroxide-simeth (MAALOX/MYLANTA) 200-200-20 MG/5ML suspension Take 30 mLs by mouth every 6 (six) hours as needed for indigestion or heartburn.    [provider]  benzonatate (TESSALON) 100 MG capsule Take 1 capsule (100 mg total) by mouth 2 (two) times daily as needed for cough. 02/09/22   Wurst, [place], PA-C  Budeson-Glycopyrrol-Formoterol (BREZTRI AEROSPHERE) 160-9-4.8 MCG/ACT AERO Inhale 2 puffs into the lungs in the morning and at bedtime. Patient taking  differently: Inhale 2 puffs into the lungs daily. 10/04/21   Rigoberto Noel, MD  chlorpheniramine-HYDROcodone 10-8 MG/5ML Take 5 mLs by mouth at bedtime as needed for cough. 02/24/22   Gery Pray, MD  clonazePAM (KLONOPIN) 0.5 MG tablet Take 1 tablet (0.5 mg total) by mouth 2 (two) times daily as needed for anxiety. 09/07/20   Kathyrn Drown, MD  cyclobenzaprine (FLEXERIL) 10 MG tablet Take 1 tablet (10 mg total) by mouth 3 (three) times daily as needed for muscle spasms. Do not drink alcohol or drive while taking this medication.  May cause drowsiness. 03/17/22   Volney American, PA-C  naproxen (NAPROSYN) 500 MG tablet Take 1 tablet (500 mg total) by mouth 2 (two) times daily with a meal. 01/17/22   Carvel Getting, NP  pantoprazole (PROTONIX) 40 MG tablet Take 1 tablet (40 mg total) by mouth daily. 09/27/21   Kathyrn Drown, MD  predniSONE (DELTASONE) 20 MG tablet Take 2 tablets (40 mg total) by mouth daily with breakfast. 03/17/22   Volney American, PA-C  promethazine-dextromethorphan (PROMETHAZINE-DM) 6.25-15 MG/5ML syrup Take 5 mLs by mouth 4 (four) times daily as needed for cough. 01/11/22   Gildardo Pounds, NP  sucralfate (CARAFATE) 1 g tablet Take 1 tablet (1 g total) by mouth 4 (four) times daily -  with meals and at bedtime. Crush and dissolve in 10 mL's of warm water prior to swallowing. Take 20 minutes before meals 01/07/22   Gery Pray,  MD      Allergies    Patient has no known allergies.    Review of Systems   Review of Systems  Respiratory:  Positive for cough.     Physical Exam Updated Vital Signs BP 123/72 (BP Location: Right Arm)   Pulse (!) 104   Temp 98.2 F (36.8 C) (Oral)   Resp 20   Ht 1.829 m (6')   Wt 122.2 kg   SpO2 98%   BMI 36.54 kg/m  Physical Exam Vitals and nursing note reviewed.  Constitutional:      General: He is not in acute distress.    Appearance: He is well-developed. He is not diaphoretic.  HENT:     Head: Normocephalic and  atraumatic.     Right Ear: External ear normal.     Left Ear: External ear normal.  Eyes:     General: No scleral icterus.       Right eye: No discharge.        Left eye: No discharge.     Conjunctiva/sclera: Conjunctivae normal.  Neck:     Trachea: No tracheal deviation.  Cardiovascular:     Rate and Rhythm: Normal rate.  Pulmonary:     Effort: No respiratory distress.     Breath sounds: No stridor. Wheezing present.  Abdominal:     General: There is no distension.  Musculoskeletal:        General: No swelling or deformity.     Cervical back: Neck supple.  Skin:    General: Skin is warm and dry.     Findings: No rash.  Neurological:     Mental Status: He is alert.     Cranial Nerves: Cranial nerve deficit: no gross deficits.     ED Results / Procedures / Treatments   Labs (all labs ordered are listed, but only abnormal results are displayed) Labs Reviewed - No data to display  EKG None  Radiology DG Chest 2 View  Result Date: 04/06/2022 CLINICAL DATA:  Dyspnea, history of lung cancer EXAM: CHEST - 2 VIEW COMPARISON:  Radiographs 03/19/2022 FINDINGS: No focal consolidation, pleural effusion, or pneumothorax. Upper lung neoplasm is not well demonstrated. Biapical and bibasilar scarring. Normal cardiomediastinal silhouette. No acute osseous abnormality. Aortic atherosclerotic calcification. IMPRESSION: No active cardiopulmonary disease. Aortic Atherosclerosis (ICD10-I70.0). Electronically Signed   By: Placido Sou M.D.   On: 04/06/2022 20:22    Procedures Procedures    Medications Ordered in ED Medications  albuterol (PROVENTIL,VENTOLIN) solution continuous neb ( Nebulization Canceled Entry 04/06/22 2129)  ipratropium (ATROVENT) nebulizer solution 0.5 mg (0.5 mg Nebulization Given 04/06/22 2128)  predniSONE (DELTASONE) tablet 60 mg (60 mg Oral Given 04/06/22 2117)  albuterol (PROVENTIL) (2.5 MG/3ML) 0.083% nebulizer solution (2.5 mg  Given 04/06/22 2129)    ED  Course/ Medical Decision Making/ A&P Clinical Course as of 04/06/22 2305  Sun Apr 06, 2022  2045 DG Chest 2 View Chest x-ray without acute findings [JK]    Clinical Course User Index [JK] Dorie Rank, MD                           Medical Decision Making Amount and/or Complexity of Data Reviewed Radiology:  Decision-making details documented in ED Course.  Risk Prescription drug management.   Patient presented to the ED for evaluation of shortness of breath.  Known history of COPD and lung cancer.  Chest x-ray without signs of infiltrate.  No pneumonia.  Patient  had notable wheezing on exam.  Patient was given an hour-long nebulizer treatment.  He was feeling better.  He was also given a dose of steroids.  Patient told the nurse he was feeling much better and ready to discharge.  Plan was to discharge him home with a prescription for steroids.  Unfortunately patient left before I was able to prepare his discharge paperwork.        Final Clinical Impression(s) / ED Diagnoses Final diagnoses:  None    Rx / DC Orders ED Discharge Orders     None         Dorie Rank, MD 04/06/22 2305

## 2022-04-06 NOTE — ED Notes (Signed)
Pt ambulated to restroom with steady gait. James Corona Edd Fabian

## 2022-04-06 NOTE — ED Notes (Signed)
Nebulizer complete. Pt has mild expiratory wheezes in left and  inspiratory and expiratory wheezing on R which sounds improved from previous auscultation. Pt states " I'm ready to go". James Corona Edd Fabian

## 2022-04-07 ENCOUNTER — Other Ambulatory Visit: Payer: Self-pay | Admitting: Family Medicine

## 2022-04-07 ENCOUNTER — Other Ambulatory Visit: Payer: Self-pay | Admitting: *Deleted

## 2022-04-07 ENCOUNTER — Ambulatory Visit: Payer: 59

## 2022-04-07 ENCOUNTER — Telehealth: Payer: Self-pay | Admitting: Pulmonary Disease

## 2022-04-07 MED ORDER — CYCLOBENZAPRINE HCL 10 MG PO TABS: 10.0000 mg | ORAL_TABLET | Freq: Three times a day (TID) | ORAL | 1 refills | Status: DC | PRN

## 2022-04-07 MED ORDER — LEVOFLOXACIN 500 MG PO TABS: 500.0000 mg | ORAL_TABLET | Freq: Every day | ORAL | 0 refills | Status: DC

## 2022-04-07 MED ORDER — CLONAZEPAM 0.5 MG PO TABS
0.5000 mg | ORAL_TABLET | Freq: Two times a day (BID) | ORAL | 0 refills | Status: DC | PRN
Start: 2022-04-07 — End: 2022-05-12

## 2022-04-07 NOTE — Telephone Encounter (Signed)
Please send levaquin 500 mg once daily for 7 days. Take with food. Take a daily probiotic over the counter while taking. If tendon pain or >3-4 episodes of watery diarrhea develops, stop and notify immediately. Complete steroids as prescribed in ED. Use nebulizer treatments at least twice a day until symptoms improve and continue all prescribed inhalers. Mucinex [phone] mg Twice daily for chest congestion. Needs follow up in 1-2 weeks to ensure he is improving. If worsening symptoms develop, go back to ED.  Thanks!

## 2022-04-07 NOTE — Telephone Encounter (Signed)
Called and notified patients wife of recommendations and she voiced understanding. Nothing further needed. Advised her to call back if they needed anything further.

## 2022-04-07 NOTE — Telephone Encounter (Signed)
Sorry!  James Serrano, patients wife, states that they are referring to the Augmentin was prescribed at our office visit on 03/21/2022. The patient thinks he finished the course last Wednesday 8/9 and states he was not feeling any better while on the antibiotic course or afterwards.

## 2022-04-07 NOTE — Telephone Encounter (Signed)
  Primary Pulmonologist: Dr. Elsworth Soho  Last office visit and with whom: Dr. Elsworth Soho 03/21/2022 What do we see them for (pulmonary problems): COPD severe  Last OV assessment/plan: see below   Was appointment offered to patient (explain)?  No, none available    Reason for call: Pt's wife called stating pt was in the ER at [facility] last night.  Trouble breathing.  Hurting when would breathe.  Possible infection.  Had a few questions.    Patient went to ED last night because he couldn't catch breath no pneumonia on CXR  but wheezing was noted Was given Breathing treatments and steroids  Feels amoxicillin is not working and wants to know if we can send in a stronger antibiotic for patient.  He is coughing up thick yellow and green mucus   Dr. Elsworth Soho is out of office so sending to [place] NP please advise. Thank you!   No Known Allergies  Immunization History  Administered Date(s) Administered   Moderna Sars-Covid-2 Vaccination 04/06/2020    Assessment & Plan:           Assessment & Plan Note by Rigoberto Noel, MD at 03/21/2022 9:31 AM  Author: Rigoberto Noel, MD Author Type: Physician Filed: 03/21/2022  9:31 AM  Note Status: Written Cosign: Cosign Not Required Encounter Date: 03/21/2022  Problem: Squamous cell lung cancer, unspecified laterality ([place])  Editor: Rigoberto Noel, MD (Physician)             I would favor synchronous bilateral squamous stage I cancer rather than metastatic but would await oncology input about this. He has undergone radiation treatment to about and 68-month follow-up scan appears without any evidence of recurrent disease.  Repeat CT scan will be planned for October and he is due to see oncology then, I will defer to oncology if they want to see him sooner especially if they are contemplating additional therapy        Assessment & Plan Note by Rigoberto Noel, MD at 03/21/2022 9:29 AM  Author: Rigoberto Noel, MD Author Type: Physician Filed: 03/21/2022  9:30 AM  Note  Status: Written Cosign: Cosign Not Required Encounter Date: 03/21/2022  Problem: Chronic obstructive pulmonary disease  Editor: Rigoberto Noel, MD (Physician)             We will treat him as an exacerbation with a course of antibiotic and steroids. We will use Augmentin for 7 days and prednisone for 2 weeks. Alternative cause of interstitial infiltrates could be radiation pneumonitis and prednisone will work for that too. He will continue on Breztri   Prescription for albuterol nebs and a new nebulizer was provided

## 2022-04-07 NOTE — Telephone Encounter (Signed)
Is he referring to the augmentin course from when he was here last on 7/28? If so, when did he complete it and did he feel better while he was on it? Thanks.

## 2022-04-22 ENCOUNTER — Encounter: Payer: Self-pay | Admitting: Internal Medicine

## 2022-04-23 ENCOUNTER — Other Ambulatory Visit: Payer: Self-pay | Admitting: Radiation Oncology

## 2022-04-23 DIAGNOSIS — C3490 Malignant neoplasm of unspecified part of unspecified bronchus or lung: Secondary | ICD-10-CM

## 2022-04-24 ENCOUNTER — Telehealth: Payer: Self-pay | Admitting: *Deleted

## 2022-04-24 ENCOUNTER — Other Ambulatory Visit: Payer: Self-pay

## 2022-04-24 NOTE — Telephone Encounter (Signed)
Called patient to inform of fu appt. on 05-12-22 @ 1:30 pm with Dr. Sondra Come, lvm for a return call

## 2022-05-03 IMAGING — CT CT CHEST LUNG CANCER SCREENING LOW DOSE W/O CM
2 of 3 series · 15 of 36 positions shown, 18 images · non-contrast
Comparison: 06/11/2006 chest CT.

CLINICAL DATA: 55-year-old asymptomatic male current smoker with 80
pack-year smoking history.



[Series 2: axial st · axial · 0.85mm/px · z∈[+1258,+1583]mm · 12 of 77 slices shown, 15 images]
[im 6/77  mediastinal]
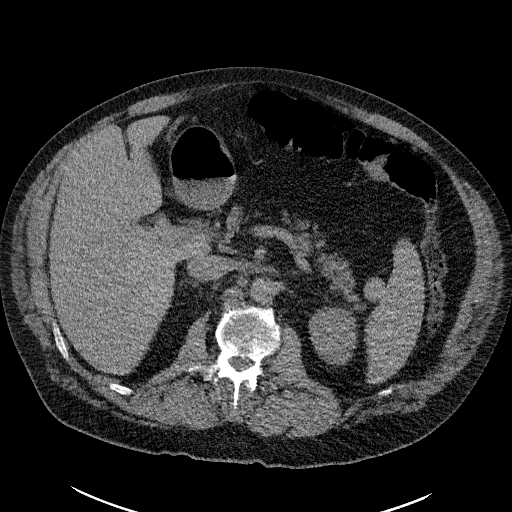
[im 6/77  lung]
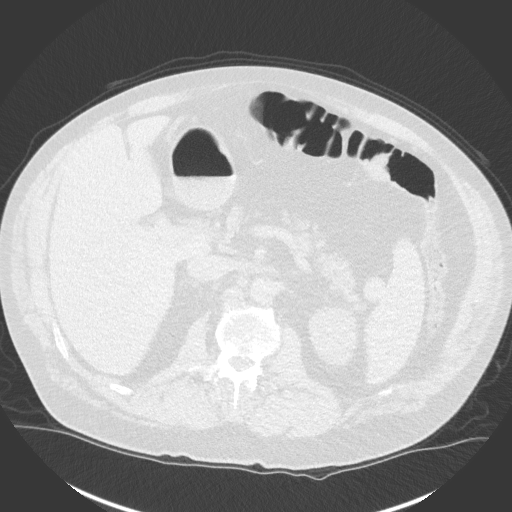
[im 12/77  lung]
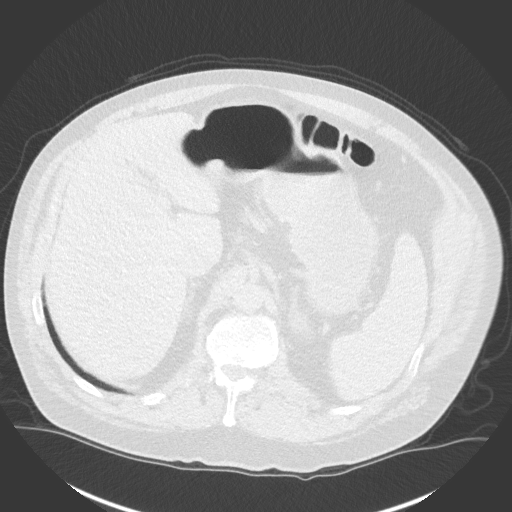
[im 17/77  lung]
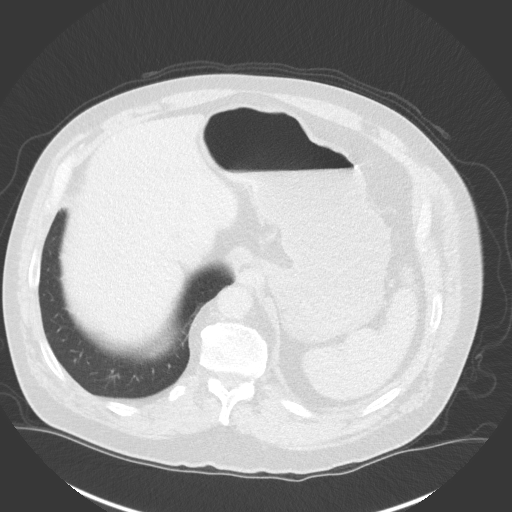
[im 23/77  lung]
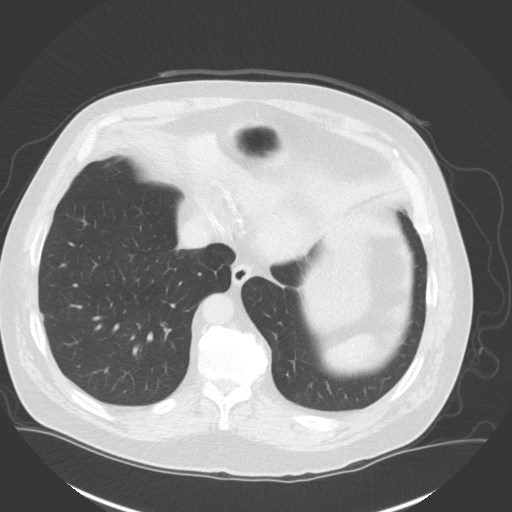
[im 29/77  mediastinal]
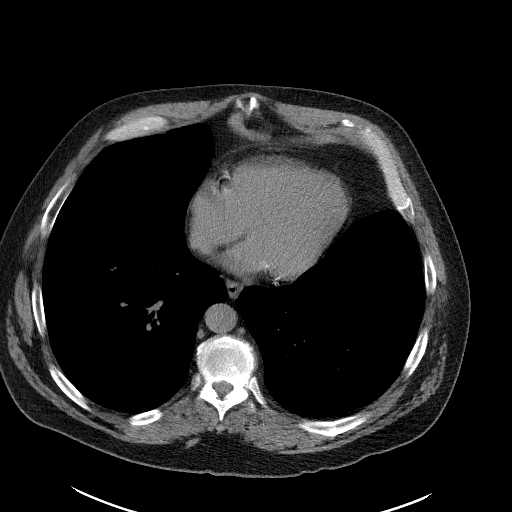
[im 29/77  lung]
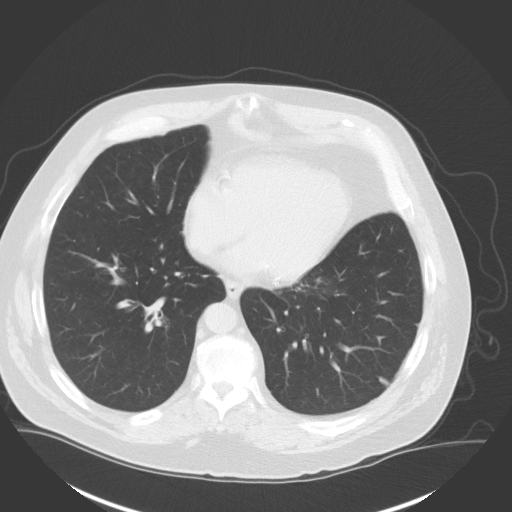
[im 34/77  lung]
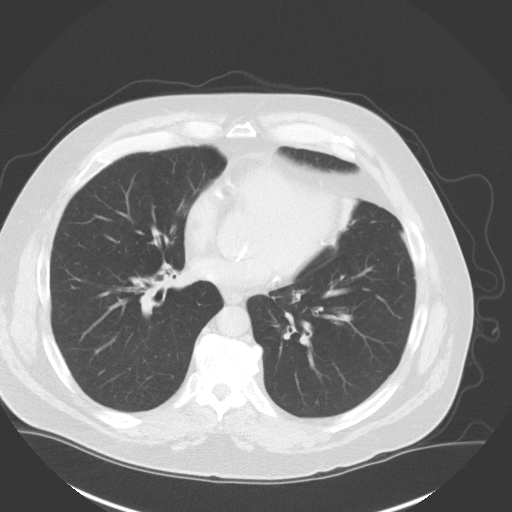
[im 43/77  lung]
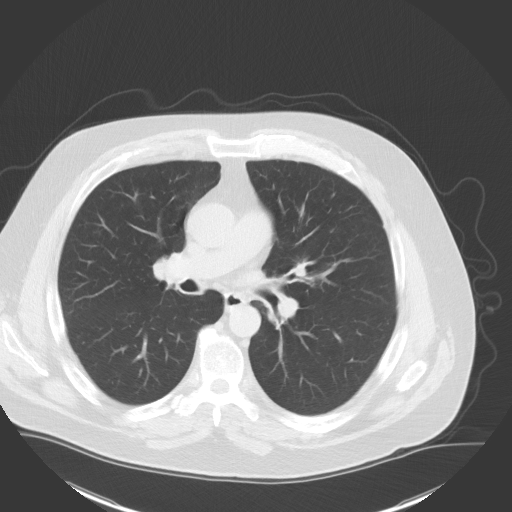
[im 48/77  lung]
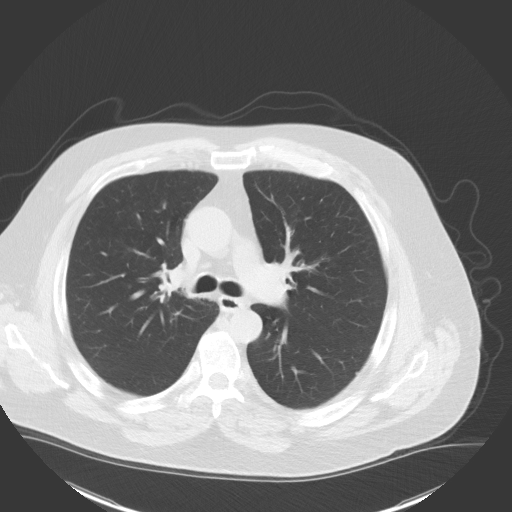
[im 54/77  mediastinal]
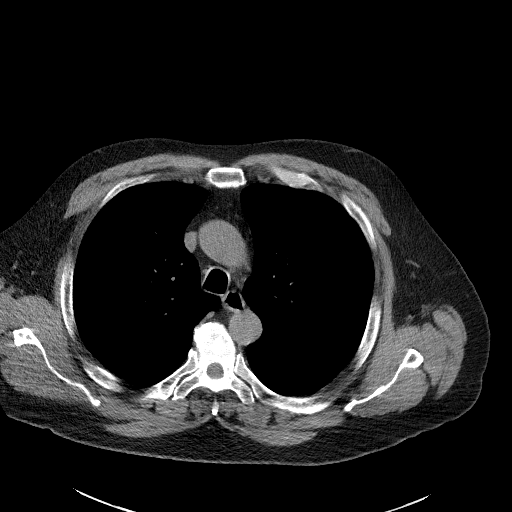
[im 54/77  lung]
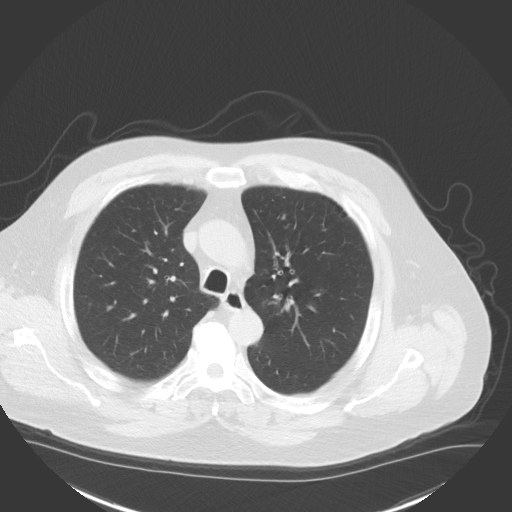
[im 60/77  lung]
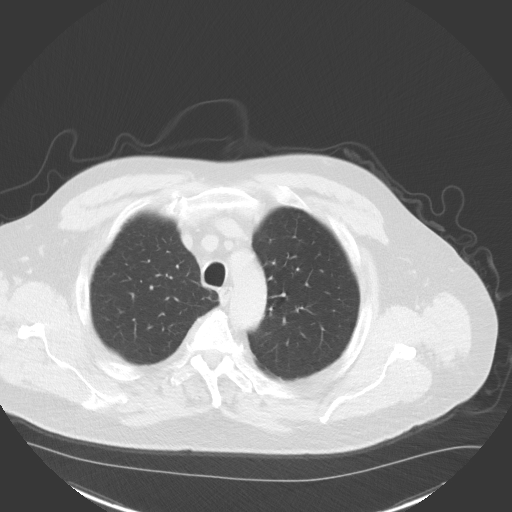
[im 65/77  lung]
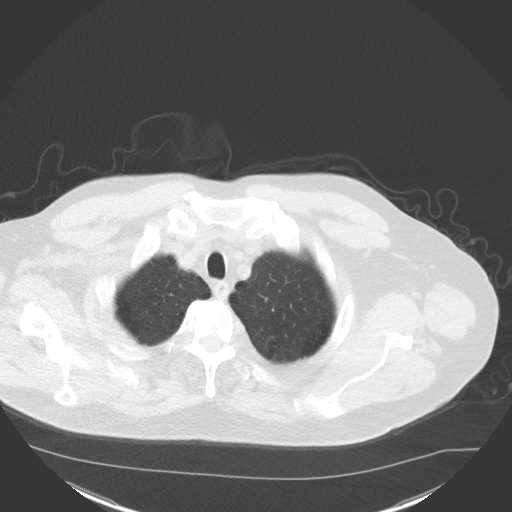
[im 71/77  lung]
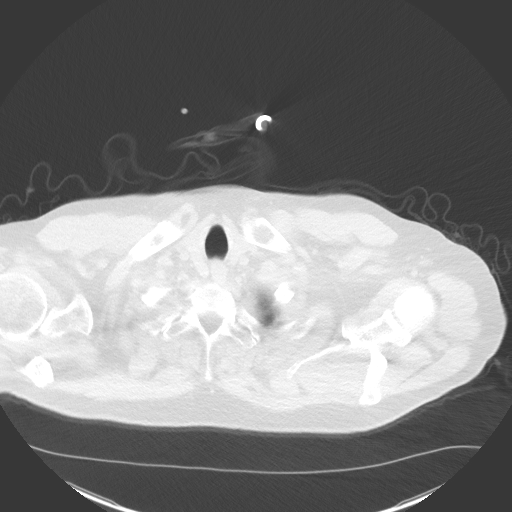

[Series 5: coronal · coronal · 0.80mm/px · 3 of 354 slices shown]
[im 71/354  lung]
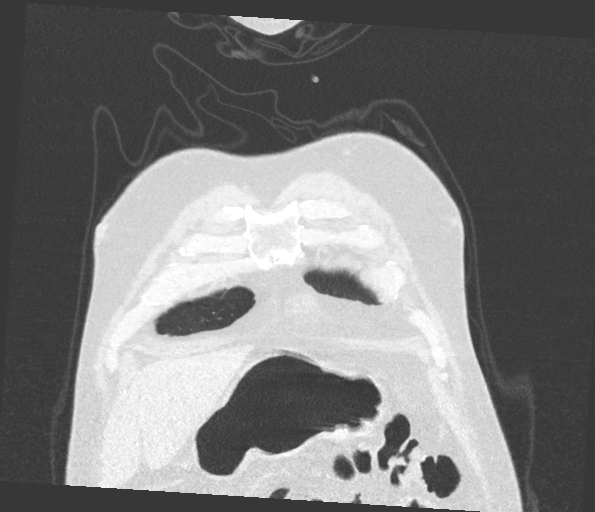
[im 142/354  lung]
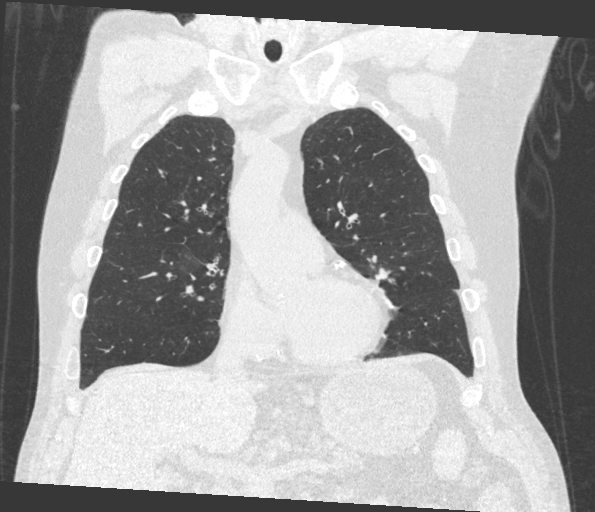
[im 212/354  lung]
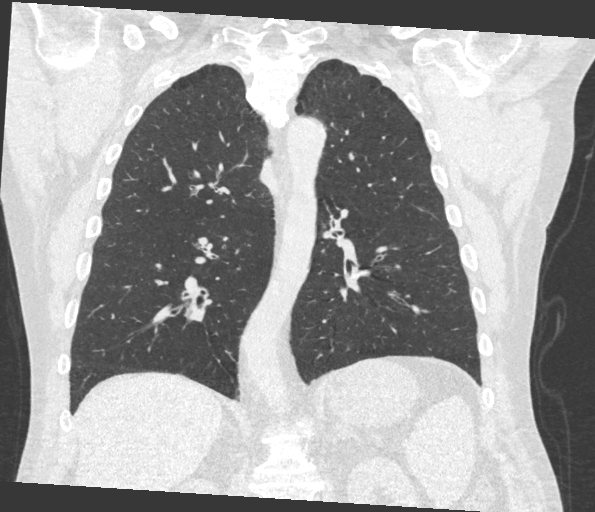

[15 of 36 positions shown; findings below may reference images not displayed]

FINDINGS: Cardiovascular: Normal heart size. No significant pericardial
effusion/thickening. Three-vessel coronary atherosclerosis.
Atherosclerotic nonaneurysmal thoracic aorta. Normal caliber
pulmonary arteries.

Mediastinum/Nodes: No discrete thyroid nodules. Unremarkable
esophagus. No pathologically enlarged axillary, mediastinal or hilar
lymph nodes, noting limited sensitivity for the detection of hilar
adenopathy on this noncontrast study.

Lungs/Pleura: No pneumothorax. No pleural effusion. Mild paraseptal
and centrilobular emphysema with diffuse bronchial wall thickening.
No acute consolidative airspace disease or lung masses. Numerous
solid pulmonary nodules scattered throughout both lungs, the largest
and most suspicious of which is a central left upper lobe irregular
nodule measuring 19.7 mm in volume derived mean diameter (series
4/image 175).

Upper abdomen: Stable 1.8 cm left adrenal nodule with density 2 HU,
compatible with benign adenoma.

Musculoskeletal: No aggressive appearing focal osseous lesions.
Moderate thoracic spondylosis.
IMPRESSION: 1. Lung-RADS 4B, suspicious. Additional imaging evaluation or
consultation with Pulmonology or Thoracic Surgery recommended.
Dominant 19.7 mm central left upper lobe irregular pulmonary nodule,
concerning for primary bronchogenic carcinoma. PET-CT suggested for
further characterization.
2. Three-vessel coronary atherosclerosis.
3. Stable left adrenal adenoma.
4. Aortic Atherosclerosis (45GQQ-[id number].K) and Emphysema ([id number]-[id number].Y).

These results will be called to the ordering clinician or
representative by the Radiologist Assistant, and communication
documented in the PACS or [REDACTED].

## 2022-05-09 ENCOUNTER — Ambulatory Visit (HOSPITAL_COMMUNITY)
Admission: RE | Admit: 2022-05-09 | Discharge: 2022-05-09 | Disposition: A | Discharge status: Home / Self Care | Payer: 59 | Source: Ambulatory Visit | Attending: Radiation Oncology | Admitting: Radiation Oncology

## 2022-05-09 ENCOUNTER — Telehealth: Payer: Self-pay | Admitting: *Deleted

## 2022-05-09 DIAGNOSIS — C3490 Malignant neoplasm of unspecified part of unspecified bronchus or lung: Secondary | ICD-10-CM | POA: Diagnosis present

## 2022-05-09 LAB — GLUCOSE, CAPILLARY: Glucose-Capillary: 107 mg/dL — ABNORMAL HIGH (ref 70–99)

## 2022-05-09 MED ORDER — FLUDEOXYGLUCOSE F - 18 (FDG) INJECTION
12.1400 | Freq: Once | INTRAVENOUS | Status: AC | PRN
  Administered 2022-05-09: 12.14 via INTRAVENOUS

## 2022-05-09 NOTE — Telephone Encounter (Signed)
Returned patient's wife's phone call, spoke with patient's wife- [name]

## 2022-05-11 ENCOUNTER — Other Ambulatory Visit: Payer: Self-pay | Admitting: Family Medicine

## 2022-05-11 NOTE — Progress Notes (Signed)
Radiation Oncology         [phone] ________________________________  Name: James Serrano MRN: [id number]  Date: 05/12/2022  DOB: 03/20/1966  Follow-Up Visit Note  CC: Kathyrn Drown, MD  [name], DO  No diagnosis found.  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   Since his last visit, the patient presented to an urgent care on 03/17/22 with a 2-day history of right-sided low back pain radiating down the back of his right leg and into his hip. He denied injury to the area but stated that he broke his back when he was young and has occasional episodic flareups resulting from this. Ultimately, the patient was treated for right sided sciatica and prescribed prednisone and muscle relaxer's.                 Several weeks later, the patient presented to the ED on 04/06/22 for evaluation of worsening cough and congestion. (Prior to this, the patient saw his pulmonologist on 03/21/22 and was given a course of antibiotics and steroids).  Chest x-ray performed showed no signs of infiltrate or evidence of pneumonia. Wheezing was noted on examination and he was given an hour-long nebulizer treatment with improvement.  He was also given a dose of steroids. (Pt was going to be discharged home with further steroids but left before paper work was complete).   His most recent restaging PET scan on 05/09/22 showed decreased metabolic activity within the lingular nodule, with residual activity just above blood pool, consistent with a response to therapy. PET also showed new asymmetric  hypermetabolic activity within the right nasopharynx (presumably inflammatory/reactive in etiology), and a slight increase in anal activity since the previous study. Otherwise, PET showed no evidence of metastatic disease.   ***  Allergies:  has No Known Allergies.  Meds: Current Outpatient Medications  Medication Sig Dispense Refill   clonazePAM (KLONOPIN) 0.5 MG tablet Take 1 tablet (0.5 mg total) by mouth 2 (two) times daily as needed for anxiety. 20 tablet 0   albuterol (PROVENTIL) (2.5 MG/3ML) 0.083% nebulizer solution Take 3 mLs (2.5 mg total) by nebulization every 6 (six) hours as needed for wheezing or shortness of breath. 180 mL 3   albuterol (VENTOLIN HFA) 108 (90 Base) MCG/ACT inhaler Inhale 2 puffs into the lungs every 4 (four) hours as needed. (Patient taking differently: Inhale 2 puffs into the lungs 2 (two) times daily.) 9 g 12   alum & mag hydroxide-simeth (MAALOX/MYLANTA) 200-200-20 MG/5ML suspension Take 30 mLs by mouth every 6 (six) hours as needed for indigestion or heartburn.     benzonatate (TESSALON) 100 MG capsule Take 1 capsule (100 mg total) by mouth 2 (two) times daily as needed for cough. 20 capsule 0   Budeson-Glycopyrrol-Formoterol (BREZTRI AEROSPHERE) 160-9-4.8 MCG/ACT AERO Inhale 2 puffs into the lungs in the morning and at bedtime. (Patient taking differently: Inhale 2 puffs into the lungs daily.) 10.7 g 11   chlorpheniramine-HYDROcodone 10-8 MG/5ML Take 5 mLs by mouth at bedtime as needed for cough. 115 mL 0   cyclobenzaprine (FLEXERIL) 10 MG tablet  Take 1 tablet (10 mg total) by mouth 3 (three) times daily as needed for muscle spasms. Do not drink alcohol or drive while taking this medication.  May cause drowsiness. 15 tablet 1   levofloxacin (LEVAQUIN) 500 MG tablet Take 1 tablet (500 mg total) by mouth daily. 7 tablet 0   naproxen (NAPROSYN) 500 MG tablet Take 1 tablet (500 mg total) by mouth 2 (two) times daily with a meal. 14 tablet 0   pantoprazole  (PROTONIX) 40 MG tablet Take 1 tablet (40 mg total) by mouth daily. 30 tablet 5   predniSONE (DELTASONE) 20 MG tablet Take 2 tablets (40 mg total) by mouth daily with breakfast. 10 tablet 0   promethazine-dextromethorphan (PROMETHAZINE-DM) 6.25-15 MG/5ML syrup Take 5 mLs by mouth 4 (four) times daily as needed for cough. 180 mL 0   sucralfate (CARAFATE) 1 g tablet Take 1 tablet (1 g total) by mouth 4 (four) times daily -  with meals and at bedtime. Crush and dissolve in 10 mL's of warm water prior to swallowing. Take 20 minutes before meals 120 tablet 1   No current facility-administered medications for this encounter.    Physical Findings: The patient is in no acute distress. Patient is alert and oriented.  vitals were not taken for this visit. .  No significant changes. Lungs are clear to auscultation bilaterally. Heart has regular rate and rhythm. No palpable cervical, supraclavicular, or axillary adenopathy. Abdomen soft, non-tender, normal bowel sounds.   Lab Findings: Lab Results  Component Value Date   WBC 5.1 12/05/2021   HGB 16.7 12/05/2021   HCT 48.3 12/05/2021   MCV 98.2 12/05/2021   PLT 170 12/05/2021    Radiographic Findings: NM PET Image Restag (PS) Skull Base To Thigh  Result Date: 05/11/2022 CLINICAL DATA:  Subsequent treatment strategy for non-small cell lung cancer. Radiation therapy completed 4 months ago. EXAM: NUCLEAR MEDICINE PET SKULL BASE TO THIGH TECHNIQUE: 12.14 mCi F-18 FDG was injected intravenously. Full-ring PET imaging was performed from the skull base to thigh after the radiotracer. CT data was obtained and used for attenuation correction and anatomic localization. Fasting blood glucose: 107 mg/dl COMPARISON:  PET-CT 09/27/2021.  Chest CT 02/21/2022. FINDINGS: Mediastinal blood pool activity: SUV max 2.3 NECK: No hypermetabolic cervical lymph nodes are identified.New asymmetric hypermetabolic activity within the right nasopharynx (SUV max 7.9) associated with  mild asymmetry of the lymphoid tissue, but no gross mass or right mastoid effusion.No other suspicious activity identified within the pharyngeal mucosal space. Incidental CT findings: none CHEST: There are no hypermetabolic mediastinal, hilar or axillary lymph nodes. The lingular nodule shows interval decreased metabolic activity (SUV max 3.0, previously 10.8). The nodule is indistinct and difficult to separate from the adjacent pulmonary vessels, but measures approximately 1.3 x 1.1 cm on image 41/7 (approximately 2.0 x 1.1 cm on previous PET-CT). Additional scattered small pulmonary nodules are unchanged. No other suspicious pulmonary metabolic activity. Incidental CT findings: Atherosclerosis of the aorta, great vessels and coronary arteries. Grossly stable scattered small pulmonary nodules bilaterally. ABDOMEN/PELVIS: There is no hypermetabolic activity within the liver, adrenal glands, spleen or pancreas. There is no hypermetabolic nodal activity in the abdomen or pelvis. As before, there is prominent focal hypermetabolic activity at the anus which currently has an SUV max of 9.1 (previously 7.8). No obvious associated mass lesion on CT images. There is also prominent activity throughout the gastric body which has increased (SUV max 7.2), without definite focal mucosal abnormality. Incidental CT findings: Previously demonstrated swirling  of the central mesenteric vasculature is no longer present. Mild aortic atherosclerosis. Stable 2.1 cm left adrenal adenoma with a density of -10 HU and no hypermetabolic activity. Mild prostatomegaly. SKELETON: There is no hypermetabolic activity to suggest osseous metastatic disease. Incidental CT findings: none IMPRESSION: 1. Decreased metabolic activity within the lingular nodule, with residual activity just above blood pool, consistent with response to therapy. 2. No evidence of metastatic disease. 3. Prominent anal activity is again noted, slightly increased from the  previous study. As noted previously, this is typically incidental although warrants correlation with digital rectal examination. 4. New asymmetric hypermetabolic activity within the right nasopharynx which is presumably inflammatory/reactive given interval development in less than 3 months. 5. Increased nonfocal gastric activity, suggesting gastritis. Electronically Signed   By: Richardean Sale M.D.   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  ***  Plan:  ***   *** minutes of total time was spent for this patient encounter, including preparation, face-to-face counseling with the patient and coordination of care, physical exam, and documentation of the encounter. ____________________________________  Blair Promise, PhD, MD  This document serves as a record of services personally performed by Gery Pray, MD. It was created on his behalf by Roney Mans, a trained medical scribe. The creation of this record is based on the scribe's personal observations and the provider's statements to them. This document has been checked and approved by the attending provider.

## 2022-05-12 ENCOUNTER — Ambulatory Visit: Payer: 59 | Admitting: Family Medicine

## 2022-05-12 ENCOUNTER — Other Ambulatory Visit: Payer: Self-pay

## 2022-05-12 ENCOUNTER — Ambulatory Visit
Admission: RE | Admit: 2022-05-12 | Discharge: 2022-05-12 | Disposition: A | Discharge status: Home / Self Care | Payer: 59 | Source: Ambulatory Visit | Attending: Radiation Oncology | Admitting: Radiation Oncology

## 2022-05-12 DIAGNOSIS — C3490 Malignant neoplasm of unspecified part of unspecified bronchus or lung: Secondary | ICD-10-CM | POA: Insufficient documentation

## 2022-05-12 NOTE — Progress Notes (Signed)
James Serrano is here today for follow up post radiation to the lung.   Does the patient complain of any of the following: Pain:no Shortness of breath w/wo exertion: yes Cough: frequent cough - productive in the mornings with yellowish/clear sputum Hemoptysis: no Pain with swallowing: no Swallowing/choking concerns: no Appetite: good Energy Level: down Post radiation skin Changes: no    Additional comments if applicable: Requesting a refill on Tessalon for his cough.  BP (!) 152/93 (BP Location: Right Arm, Patient Position: Sitting, Cuff Size: Normal)   Pulse 79   Temp 97.7 F (36.5 C)   Resp 20   Ht 6' (1.829 m)   Wt 243 lb 9.6 oz (110.5 kg)   SpO2 97%   BMI 33.04 kg/m

## 2022-05-16 ENCOUNTER — Encounter: Payer: Self-pay | Admitting: Pulmonary Disease

## 2022-05-16 ENCOUNTER — Other Ambulatory Visit: Payer: Self-pay | Admitting: Family

## 2022-05-16 ENCOUNTER — Other Ambulatory Visit: Payer: Self-pay | Admitting: Nurse Practitioner

## 2022-05-16 DIAGNOSIS — J208 Acute bronchitis due to other specified organisms: Secondary | ICD-10-CM

## 2022-05-17 ENCOUNTER — Telehealth: Payer: 59 | Admitting: Nurse Practitioner

## 2022-05-17 DIAGNOSIS — R051 Acute cough: Secondary | ICD-10-CM

## 2022-05-17 MED ORDER — PREDNISONE 20 MG PO TABS
40.0000 mg | ORAL_TABLET | Freq: Every day | ORAL | 0 refills | Status: AC
Start: 2022-05-17 — End: 2022-05-22

## 2022-05-17 NOTE — Progress Notes (Signed)
We are sorry that you are not feeling well.  Here is how we plan to help!  Based on your presentation I believe you most likely have A cough due to a virus.  This is called viral bronchitis and is best treated by rest, plenty of fluids and control of the cough.  You may use Ibuprofen or Tylenol as directed to help your symptoms.     In addition you may use A non-prescription cough medication called Mucinex DM: take 2 tablets every 12 hours.  Prednisone 20mg  2 po daily for 5 days  From your responses in the eVisit questionnaire you describe inflammation in the upper respiratory tract which is causing a significant cough.  This is commonly called Bronchitis and has four common causes:   Allergies Viral Infections Acid Reflux Bacterial Infection Allergies, viruses and acid reflux are treated by controlling symptoms or eliminating the cause. An example might be a cough caused by taking certain blood pressure medications. You stop the cough by changing the medication. Another example might be a cough caused by acid reflux. Controlling the reflux helps control the cough.  USE OF BRONCHODILATOR ("RESCUE") INHALERS: There is a risk from using your bronchodilator too frequently.  The risk is that over-reliance on a medication which only relaxes the muscles surrounding the breathing tubes can reduce the effectiveness of medications prescribed to reduce swelling and congestion of the tubes themselves.  Although you feel brief relief from the bronchodilator inhaler, your asthma may actually be worsening with the tubes becoming more swollen and filled with mucus.  This can delay other crucial treatments, such as oral steroid medications. If you need to use a bronchodilator inhaler daily, several times per day, you should discuss this with your provider.  There are probably better treatments that could be used to keep your asthma under control.     HOME CARE Only take medications as instructed by your medical  team. Complete the entire course of an antibiotic. Drink plenty of fluids and get plenty of rest. Avoid close contacts especially the very young and the elderly Cover your mouth if you cough or cough into your sleeve. Always remember to wash your hands A steam or ultrasonic humidifier can help congestion.   GET HELP RIGHT AWAY IF: You develop worsening fever. You become short of breath You cough up blood. Your symptoms persist after you have completed your treatment plan MAKE SURE YOU  Understand these instructions. Will watch your condition. Will get help right away if you are not doing well or get worse.    Thank you for choosing an e-visit.  Your e-visit answers were reviewed by a board certified advanced clinical practitioner to complete your personal care plan. Depending upon the condition, your plan could have included both over the counter or prescription medications.  Please review your pharmacy choice. Make sure the pharmacy is open so you can pick up prescription now. If there is a problem, you may contact your provider through [employer] and have the prescription routed to another pharmacy.  Your safety is important to [place]. If you have drug allergies check your prescription carefully.   For the next 24 hours you can use MyChart to ask questions about today's visit, request a non-urgent call back, or ask for a work or school excuse. You will get an email in the next two days asking about your experience. I hope that your e-visit has been valuable and will speed your recovery.   Mary-Margaret Hassell Done, FNP  5-10 minutes spent reviewing and documenting in chart.

## 2022-05-20 ENCOUNTER — Telehealth: Payer: Self-pay | Admitting: Pulmonary Disease

## 2022-05-20 MED ORDER — BENZONATATE 200 MG PO CAPS: 200.0000 mg | ORAL_CAPSULE | Freq: Three times a day (TID) | ORAL | 2 refills | Status: DC | PRN

## 2022-05-20 NOTE — Telephone Encounter (Signed)
Called and spoke with James Serrano and notified her that refill was sent in. Nothing further needed

## 2022-05-20 NOTE — Telephone Encounter (Signed)
Pt seen Dr. Sondra Come last week and was to send in [facility] for cough but wasn't sent in.  States cough is really bad.  Asking if Dr. Elsworth Soho could send a prescription in.   Dr. Elsworth Soho are you okay with [place] sending this in for patient?

## 2022-05-30 ENCOUNTER — Telehealth: Payer: Self-pay

## 2022-05-30 NOTE — Telephone Encounter (Signed)
Nurses-please talk with patient and/or wife find out from them where would they like to go regarding gastroenterology His PET scan showed increased metabolic activity within the rectal area will need gastroenterology referral for this May go ahead with referral Also keep follow-up visit later in October as planned

## 2022-05-30 NOTE — Telephone Encounter (Signed)
Caller name:Jayston Lacinda Axon III   On DPR? :Yes  Call back [place]  Provider they see: [place]   Reason for call:Pt seen Gery Pray and got results back on test. Pt is needing a referral to [facility] for possible cancer per wife will the pt need a office visit first or can the referral be sent?

## 2022-06-01 ENCOUNTER — Encounter: Payer: Self-pay | Admitting: Family Medicine

## 2022-06-01 DIAGNOSIS — R948 Abnormal results of function studies of other organs and systems: Secondary | ICD-10-CM

## 2022-06-02 NOTE — Telephone Encounter (Signed)
Nurses Please put in stat referral to [facility] gastroenterology due to evidence of increased uptake in the rectum area  Find out from James Serrano and his wife James Serrano if they have a preference of whom otherwise I can call down to [facility] requesting as quick as possible appointment-certainly I sympathize with her situation  You may share a copy of this message with Kerry Dory as well thank you

## 2022-06-02 NOTE — Telephone Encounter (Signed)
See my chart message with communication from patient concerning this matter

## 2022-06-03 MED ORDER — PROMETHAZINE-DM 6.25-15 MG/5ML PO SYRP: 5.0000 mL | ORAL_SOLUTION | Freq: Four times a day (QID) | ORAL | 0 refills | Status: DC | PRN

## 2022-06-06 ENCOUNTER — Encounter: Payer: Self-pay | Admitting: Internal Medicine

## 2022-06-09 ENCOUNTER — Ambulatory Visit: Payer: 59 | Admitting: Internal Medicine

## 2022-06-13 ENCOUNTER — Ambulatory Visit (INDEPENDENT_AMBULATORY_CARE_PROVIDER_SITE_OTHER): Payer: 59 | Admitting: Pulmonary Disease

## 2022-06-13 ENCOUNTER — Encounter: Payer: Self-pay | Admitting: Pulmonary Disease

## 2022-06-13 VITALS — BP 116/76 | HR 87 | Temp 97.9°F | Ht 72.0 in | Wt 245.0 lb

## 2022-06-13 DIAGNOSIS — Z23 Encounter for immunization: Secondary | ICD-10-CM | POA: Diagnosis not present

## 2022-06-13 DIAGNOSIS — J432 Centrilobular emphysema: Secondary | ICD-10-CM | POA: Diagnosis not present

## 2022-06-13 DIAGNOSIS — J328 Other chronic sinusitis: Secondary | ICD-10-CM

## 2022-06-13 DIAGNOSIS — C3490 Malignant neoplasm of unspecified part of unspecified bronchus or lung: Secondary | ICD-10-CM

## 2022-06-13 MED ORDER — ALBUTEROL SULFATE HFA 108 (90 BASE) MCG/ACT IN AERS: 2.0000 | INHALATION_SPRAY | RESPIRATORY_TRACT | 12 refills | Status: DC | PRN

## 2022-06-13 MED ORDER — BREZTRI AEROSPHERE 160-9-4.8 MCG/ACT IN AERO: 2.0000 | INHALATION_SPRAY | Freq: Two times a day (BID) | RESPIRATORY_TRACT | 11 refills | Status: DC

## 2022-06-13 MED ORDER — PREDNISONE 10 MG PO TABS: ORAL_TABLET | ORAL | 0 refills | Status: AC

## 2022-06-13 NOTE — Assessment & Plan Note (Signed)
Continue Breztri. Use albuterol as needed for rescue Flu shot today. We discussed COPD action plan and signs and symptoms of COPD exacerbation

## 2022-06-13 NOTE — Progress Notes (Signed)
   Subjective:    Patient ID: James Serrano, male    DOB: September 04, 1965, 56 y.o.   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 PET  showed hypermetabolism in this left upper lobe nodule with SUV 10.8, there was some activity of mesenteric vessels also noted Unfortunately, bronchoscopy showed an additional lesion in the right lower lobe which was also squamous cancer.  He underwent SBRT to the left upper lobe lung nodule in addition to ultra hypofractionated treatment to the right lower lobe    Family history of lung cancer in his dad, brother died of sarcoma metastatic to lungs, mom has an unknown kind of cancer and also had asthma.  Chief Complaint  Patient presents with   Follow-up    He is still having some shortness of breath with exertion, wheezing, chest tightness, and cough that is clear.    He underwent radiation therapy. He still complains of dyspnea.  He is compliant with Breztri. He complains of a chronic cough for the past 3 to 4 months.  Tessalon Perles only helped a little bit.  He has been prescribed Tussionex. PET 04/2022 reviewed, hypermetabolism has decreased, left upper lobe nodule has decreased in size from 2 cm to 1 cm, no activity on the right.  Some activity around the anal area which was noted back in February and in the nasopharynx  He complains of postnasal drip regardless of season changes. He also complains of intermittent reflux symptoms.  Has not been using Protonix .  By his wife today who corroborates history. I have reviewed radiation oncology note and previous PET scan  Significant tests/ events reviewed   11/12/21 video bronchoscopy with robotic assisted bronchoscopic navigation.  He had biopsy of the left upper lobe lung  nodule but there was also a right lower lobe endobronchial lesion      LDCT 08/2021 Dominant 19.7 mm central left upper lobe irregular pulmonary nodule   PFTs 09/13/2021 severe airway obstruction, ratio 48, FEV1 1.35/34%, FVC 53%, TLC 1 1 3%, DLCO 92%/27.7 , postbronchodilator FEV1 was 1.47   Review of Systems neg for any significant sore throat, dysphagia, itching, sneezing, nasal congestion or excess/ purulent secretions, fever, chills, sweats, unintended wt loss, pleuritic or exertional cp, hempoptysis, orthopnea pnd or change in chronic leg swelling. Also denies presyncope, palpitations, heartburn, abdominal pain, nausea, vomiting, diarrhea or change in bowel or urinary habits, dysuria,hematuria, rash, arthralgias, visual complaints, headache, numbness weakness or ataxia.     Objective:   Physical Exam  Gen. Pleasant, well-nourished, in no distress ENT - no thrush, no pallor/icterus,no post nasal drip Neck: No JVD, no thyromegaly, no carotid bruits Lungs: no use of accessory muscles, no dullness to percussion, clear without rales or rhonchi  Cardiovascular: Rhythm regular, heart sounds  normal, no murmurs or gallops, no peripheral edema Musculoskeletal: No deformities, no cyanosis or clubbing        Assessment & Plan:   Chronic cough -likely related to upper airway cough syndrome/postnasal drip or reflux. We will treat him with 4 generation antihistaminic/decongestant combination for 3 to 4 weeks and then sequentially for reflux with Protonix. There does not seem to be significant radiation pneumonitis

## 2022-06-13 NOTE — Assessment & Plan Note (Signed)
Appears synchronous bilateral stage I rather than metastatic. PET scan is reassuring for no metastatic disease. He will undergo ENT evaluation for PET scan positivity in the nasopharynx but I feel this may be related to sinus drip

## 2022-06-13 NOTE — Patient Instructions (Addendum)
   Trial of chlorpheniramine 4 mg at bedtime x 3 weeks + phenylephrine 10 mg daytime  (combination [facility] brand 'sinus PE')  Take protonix daily before dinner x 4-6 weeks  xRefills on breztri & albuterol  X Prednisone 10 mg tabs  Take 2 tabs daily with food x 5ds, then 1 tab daily with food x 5ds then STOP  X ENT evaluation for sinusitis & + PET scan  X flu shot

## 2022-06-17 ENCOUNTER — Ambulatory Visit: Payer: 59 | Admitting: Family Medicine

## 2022-06-19 ENCOUNTER — Ambulatory Visit: Payer: 59 | Admitting: Internal Medicine

## 2022-06-24 ENCOUNTER — Ambulatory Visit: Payer: 59 | Admitting: Internal Medicine

## 2022-06-30 ENCOUNTER — Other Ambulatory Visit: Payer: Self-pay | Admitting: Pulmonary Disease

## 2022-06-30 ENCOUNTER — Other Ambulatory Visit: Payer: Self-pay

## 2022-06-30 ENCOUNTER — Inpatient Hospital Stay: Payer: 59 | Attending: Internal Medicine | Admitting: Internal Medicine

## 2022-06-30 VITALS — BP 138/91 | HR 77 | Temp 97.7°F | Wt 247.8 lb

## 2022-06-30 DIAGNOSIS — J4489 Other specified chronic obstructive pulmonary disease: Secondary | ICD-10-CM | POA: Diagnosis not present

## 2022-06-30 DIAGNOSIS — R0609 Other forms of dyspnea: Secondary | ICD-10-CM | POA: Diagnosis not present

## 2022-06-30 DIAGNOSIS — C3490 Malignant neoplasm of unspecified part of unspecified bronchus or lung: Secondary | ICD-10-CM

## 2022-06-30 DIAGNOSIS — Z79899 Other long term (current) drug therapy: Secondary | ICD-10-CM | POA: Insufficient documentation

## 2022-06-30 DIAGNOSIS — Z923 Personal history of irradiation: Secondary | ICD-10-CM | POA: Insufficient documentation

## 2022-06-30 DIAGNOSIS — C3431 Malignant neoplasm of lower lobe, right bronchus or lung: Secondary | ICD-10-CM | POA: Diagnosis present

## 2022-06-30 DIAGNOSIS — Z8616 Personal history of COVID-19: Secondary | ICD-10-CM | POA: Insufficient documentation

## 2022-06-30 DIAGNOSIS — J328 Other chronic sinusitis: Secondary | ICD-10-CM

## 2022-06-30 NOTE — Progress Notes (Signed)
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  PD-L1 expression is negative.   PRIOR THERAPY: Status post SBRT to the squamous cell carcinoma of the left upper lobe and right lower lobe under the care of Dr. Sondra Come completed on 01/07/2022  CURRENT THERAPY: Observation.  INTERVAL HISTORY: James Serrano 56 y.o. male returns to the clinic today for follow-up visit accompanied by his wife.  The patient is feeling fine today with no concerning complaints except for shortness of breath with exertion.  He was recently seen by Dr. Elsworth Soho and was given a course of steroid treatment as well as inhalers.  He denied having any current chest pain, cough or hemoptysis.  He has no nausea, vomiting, diarrhea or constipation.  He has no headache or visual changes.  He denied having any significant weight loss or night sweats.  He had a PET scan performed in September 2023 and he is here for evaluation and recommendation regarding his condition.  MEDICAL HISTORY: Past Medical History:  Diagnosis Date   Anxiety    Asthma    Bronchitis    COPD (chronic obstructive pulmonary disease) ([place])    COVID-19 08/2020   GERD (gastroesophageal reflux disease)    History of radiation therapy    Right chest 12/25/21-01/07/22- Dr. Gery Pray   History of radiation therapy    Left Lung- 12/25/21-01/07/22- Dr. Gery Pray   Lung cancer [facility]) 2023   Tobacco use     ALLERGIES:  has No Known Allergies.  MEDICATIONS:  Current Outpatient Medications  Medication Sig Dispense Refill   albuterol (PROVENTIL) (2.5 MG/3ML) 0.083% nebulizer solution Take 3 mLs (2.5 mg total) by nebulization every 6  (six) hours as needed for wheezing or shortness of breath. 180 mL 3   albuterol (VENTOLIN HFA) 108 (90 Base) MCG/ACT inhaler Inhale 2 puffs into the lungs every 4 (four) hours as needed. 9 g 12   alum & mag hydroxide-simeth (MAALOX/MYLANTA) 200-200-20 MG/5ML suspension Take 30 mLs by mouth every 6 (six) hours as needed for indigestion or heartburn.     benzonatate (TESSALON) 200 MG capsule Take 1 capsule (200 mg total) by mouth 3 (three) times daily as needed for cough. 90 capsule 2   Budeson-Glycopyrrol-Formoterol (BREZTRI AEROSPHERE) 160-9-4.8 MCG/ACT AERO Inhale 2 puffs into the lungs in the morning and at bedtime. 10.7 g 11   clonazePAM (KLONOPIN) 0.5 MG tablet Take 1 tablet by mouth twice daily as needed for anxiety 20 tablet 0   cyclobenzaprine (FLEXERIL) 10 MG tablet Take 1 tablet (10 mg total) by mouth 3 (three) times daily as needed for muscle spasms. Do not drink alcohol or drive while taking this medication.  May cause drowsiness. 15 tablet 1   naproxen (NAPROSYN) 500 MG tablet Take 1 tablet (500 mg total) by mouth 2 (two) times daily with a meal. 14 tablet 0   pantoprazole (PROTONIX) 40 MG tablet Take 1 tablet (40 mg total) by mouth daily. 30 tablet 5   No current facility-administered medications for this visit.    SURGICAL HISTORY:  Past Surgical History:  Procedure Laterality Date   BRONCHIAL BIOPSY  11/12/2021   Procedure: BRONCHIAL BIOPSIES;  Surgeon: June Leap  L, DO;  Location: [place];  Service: Pulmonary;;   BRONCHIAL BRUSHINGS  11/12/2021   Procedure: BRONCHIAL BRUSHINGS;  Surgeon: [name], DO;  Location: [place];  Service: Pulmonary;;   BRONCHIAL NEEDLE ASPIRATION BIOPSY  11/12/2021   Procedure: BRONCHIAL NEEDLE ASPIRATION BIOPSIES;  Surgeon: [name], DO;  Location: [facility];  Service: Pulmonary;;   ORIF ACETABULAR FRACTURE     TONSILLECTOMY      REVIEW OF SYSTEMS:  A comprehensive review of systems was negative except for:  Respiratory: positive for dyspnea on exertion   PHYSICAL EXAMINATION: General appearance: alert, cooperative, fatigued, and no distress Head: Normocephalic, without obvious abnormality, atraumatic Neck: no adenopathy, no JVD, supple, symmetrical, trachea midline, and thyroid not enlarged, symmetric, no tenderness/mass/nodules Lymph nodes: Cervical, supraclavicular, and axillary nodes normal. Resp: clear to auscultation bilaterally Back: symmetric, no curvature. ROM normal. No CVA tenderness. Cardio: regular rate and rhythm, S1, S2 normal, no murmur, click, rub or gallop GI: soft, non-tender; bowel sounds normal; no masses,  no organomegaly Extremities: extremities normal, atraumatic, no cyanosis or edema  ECOG PERFORMANCE STATUS: 1 - Symptomatic but completely ambulatory  There were no vitals taken for this visit.  LABORATORY DATA: Lab Results  Component Value Date   WBC 5.1 12/05/2021   HGB 16.7 12/05/2021   HCT 48.3 12/05/2021   MCV 98.2 12/05/2021   PLT 170 12/05/2021      Chemistry      Component Value Date/Time   NA 137 12/05/2021 1100   NA 139 03/16/2020 1010   K 4.3 12/05/2021 1100   CL 103 12/05/2021 1100   CO2 30 12/05/2021 1100   BUN 7 12/05/2021 1100   BUN 7 03/16/2020 1010   CREATININE 0.97 12/05/2021 1100      Component Value Date/Time   CALCIUM 9.5 12/05/2021 1100   ALKPHOS 62 12/05/2021 1100   AST 18 12/05/2021 1100   ALT 16 12/05/2021 1100   BILITOT 0.7 12/05/2021 1100       RADIOGRAPHIC STUDIES: No results found.  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 The patient is status post SBRT under the care of Dr. Sondra Come. He had repeat PET scan performed in September 2023  that showed no evidence for disease progression and there was some improvement in his disease. I recommended for the patient to continue on observation with repeat CT scan of the chest in 4 months. For the shortness of breath and COPD, he will continue his routine follow-up visit and evaluation by Dr. Elsworth Soho. The patient was advised to call immediately if he has any other concerning symptoms in the interval. The patient voices understanding of current disease status and treatment options and is in agreement with the current care plan.  All questions were answered. The patient knows to call the clinic with any problems, questions or concerns. We can certainly see the patient much sooner if necessary.  The total time spent in the appointment was 20 minutes.  Disclaimer: This note was dictated with voice recognition software. Similar sounding words can inadvertently be transcribed and may not be corrected upon review.

## 2022-07-01 ENCOUNTER — Telehealth: Payer: Self-pay | Admitting: Pulmonary Disease

## 2022-07-01 MED ORDER — PREDNISONE 10 MG PO TABS: ORAL_TABLET | ORAL | 0 refills | Status: AC

## 2022-07-01 MED ORDER — AZITHROMYCIN 250 MG PO TABS: ORAL_TABLET | ORAL | 0 refills | Status: AC

## 2022-07-01 NOTE — Telephone Encounter (Signed)
[name] wife states patient having symptoms of shortness of breath and coughing and fatigue.  States the [employer] are not working. States he is wheezing.  Has done at home covid test and it was negative. Denies fever. Has headaches and feels "his nose is about to explode." States he is coughing more today.    Took over the counter Mucinex and nasal spray.   Wants to know if theres something we can call in to help. Pharmacy is [facility].

## 2022-07-01 NOTE — Telephone Encounter (Signed)
Called and notified patients wife [name] of response from Dr. Elsworth Soho. She voiced understanding of medication instructions. Nothing further needed

## 2022-07-16 ENCOUNTER — Encounter: Payer: Self-pay | Admitting: Pulmonary Disease

## 2022-08-04 ENCOUNTER — Ambulatory Visit: Payer: 59 | Admitting: Internal Medicine

## 2022-08-19 ENCOUNTER — Other Ambulatory Visit: Payer: Self-pay | Admitting: Pulmonary Disease

## 2022-08-19 ENCOUNTER — Other Ambulatory Visit: Payer: Self-pay | Admitting: *Deleted

## 2022-08-19 MED ORDER — AZITHROMYCIN 250 MG PO TABS: ORAL_TABLET | ORAL | 0 refills | Status: AC

## 2022-08-19 MED ORDER — PREDNISONE 10 MG PO TABS: ORAL_TABLET | ORAL | 0 refills | Status: DC

## 2022-08-19 MED ORDER — ALBUTEROL SULFATE (2.5 MG/3ML) 0.083% IN NEBU: INHALATION_SOLUTION | RESPIRATORY_TRACT | 0 refills | Status: DC

## 2022-08-19 NOTE — Telephone Encounter (Signed)
Primary Pulmonologist: Dr. Elsworth Soho  Last office visit and with whom: 06/13/2022 [facility]  What do we see them for (pulmonary problems): Centrilobular emphysema/ squamous cell lung cancer  Last OV assessment/plan: see below    Was appointment offered to patient (explain)?  No availability in [facility] office      Reason for call:  Called and spoke to [place], patients wife and states for the last few days patient has had some increased SOB. Feels he has  a lot of chest congestion he is having a hard time getting to come up. Denies fever/ n/v/d/ wheezing. Patients wife is asking for a z pak and steroids for patient and a refill on Proventil neb solution.    Dr. Elsworth Soho is out office today, so sending to DOD Please advise    No Known Allergies       Immunization History  Administered Date(s) Administered   Influenza,inj,Quad PF,6+ Mos 06/13/2022   Moderna Sars-Covid-2 Vaccination 04/06/2020    Assessment & Plan:    Chronic cough -likely related to upper airway cough syndrome/postnasal drip or reflux. We will treat him with 4 generation antihistaminic/decongestant combination for 3 to 4 weeks and then sequentially for reflux with Protonix. There does not seem to be significant radiation pneumonitis         Assessment & Plan Note by Rigoberto Noel, MD at 06/13/2022 10:24 AM           Author: Rigoberto Noel, MD Author Type: Physician Filed: 06/13/2022 10:24 AM  Note Status: Written Cosign: Cosign Not Required Encounter Date: 06/13/2022  Problem: Squamous cell lung cancer, unspecified laterality ([place])  Editor: Rigoberto Noel, MD (Physician)                       Appears synchronous bilateral stage I rather than metastatic. PET scan is reassuring for no metastatic disease. He will undergo ENT evaluation for PET scan positivity in the nasopharynx but I feel this may be related to sinus drip             Assessment & Plan Note by Rigoberto Noel, MD at 06/13/2022 10:23 AM           Author: Rigoberto Noel, MD Author Type: Physician Filed: 06/13/2022 10:24 AM  Note Status: Written Cosign: Cosign Not Required Encounter Date: 06/13/2022  Problem: Chronic obstructive pulmonary disease  Editor: Rigoberto Noel, MD (Physician)                       Continue Judithann Sauger. Use albuterol as needed for rescue Flu shot today. We discussed COPD action plan and signs and symptoms of COPD exacerbation             Patient Instructions by Rigoberto Noel, MD at 06/13/2022 9:15 AM           Author: Rigoberto Noel, MD Author Type: Physician Filed: 06/13/2022  9:47 AM  Note Status: Addendum Cosign: Cosign Not Required Encounter Date: 06/13/2022  Editor: Rigoberto Noel, MD (Physician)          Prior Versions: 1. Rigoberto Noel, MD (Physician) at 06/13/2022  9:47 AM - Signed     Trial of chlorpheniramine 4 mg at bedtime x 3 weeks + phenylephrine 10 mg daytime  (combination [facility] brand 'sinus PE')   Take protonix daily before dinner x 4-6 weeks   xRefills on breztri & albuterol   X Prednisone 10 mg tabs  Take 2 tabs daily with food x 5ds, then 1 tab daily with food x 5ds then STOP   X ENT evaluation for sinusitis & + PET scan   X flu shot

## 2022-08-19 NOTE — Telephone Encounter (Signed)
Primary Pulmonologist: Dr. Elsworth Soho  Last office visit and with whom: 06/13/2022 [facility]  What do we see them for (pulmonary problems): Centrilobular emphysema/ squamous cell lung cancer  Last OV assessment/plan: see below   Was appointment offered to patient (explain)?  No availability in [facility] office    Reason for call:  Called and spoke to [place], patients wife and states for the last few days patient has had some increased SOB. Feels he has  a lot of chest congestion he is having a hard time getting to come up. Denies fever/ n/v/d/ wheezing. Patients wife is asking for a z pak and steroids for patient and a refill on Proventil neb solution.   Dr. Elsworth Soho is out office today, so sending to [facility]OD Please advise   No Known Allergies  Immunization History  Administered Date(s) Administered   Influenza,inj,Quad PF,6+ Mos 06/13/2022   Moderna Sars-Covid-2 Vaccination 04/06/2020    Assessment & Plan:    Chronic cough -likely related to upper airway cough syndrome/postnasal drip or reflux. We will treat him with 4 generation antihistaminic/decongestant combination for 3 to 4 weeks and then sequentially for reflux with Protonix. There does not seem to be significant radiation pneumonitis        Assessment & Plan Note by Rigoberto Noel, MD at 06/13/2022 10:24 AM  Author: Rigoberto Noel, MD Author Type: Physician Filed: 06/13/2022 10:24 AM  Note Status: Written Cosign: Cosign Not Required Encounter Date: 06/13/2022  Problem: Squamous cell lung cancer, unspecified laterality ([place])  Editor: Rigoberto Noel, MD (Physician)             Appears synchronous bilateral stage I rather than metastatic. PET scan is reassuring for no metastatic disease. He will undergo ENT evaluation for PET scan positivity in the nasopharynx but I feel this may be related to sinus drip        Assessment & Plan Note by Rigoberto Noel, MD at 06/13/2022 10:23 AM  Author: Rigoberto Noel, MD Author Type: Physician Filed:  06/13/2022 10:24 AM  Note Status: Written Cosign: Cosign Not Required Encounter Date: 06/13/2022  Problem: Chronic obstructive pulmonary disease  Editor: Rigoberto Noel, MD (Physician)             Continue Judithann Sauger. Use albuterol as needed for rescue Flu shot today. We discussed COPD action plan and signs and symptoms of COPD exacerbation        Patient Instructions by Rigoberto Noel, MD at 06/13/2022 9:15 AM  Author: Rigoberto Noel, MD Author Type: Physician Filed: 06/13/2022  9:47 AM  Note Status: Addendum Cosign: Cosign Not Required Encounter Date: 06/13/2022  Editor: Rigoberto Noel, MD (Physician)      Prior Versions: 1. Rigoberto Noel, MD (Physician) at 06/13/2022  9:47 AM - Signed     Trial of chlorpheniramine 4 mg at bedtime x 3 weeks + phenylephrine 10 mg daytime  (combination [facility] brand 'sinus PE')   Take protonix daily before dinner x 4-6 weeks   xRefills on breztri & albuterol   X Prednisone 10 mg tabs  Take 2 tabs daily with food x 5ds, then 1 tab daily with food x 5ds then STOP   X ENT evaluation for sinusitis & + PET scan   X flu shot

## 2022-08-19 NOTE — Telephone Encounter (Signed)
Medical history significant for COPD, last seen by Dr. Elsworth Soho June 13, 2022   Send like a COPD exacerbation can have a Z-Pak No. 1 take as directed, prednisone taper prednisone 10mg  4 tabs for 2 days, then 3 tabs for 2 days, 2 tabs for 2 days, then 1 tab for 2 days, then stop #20  Continue on maintenance regimen. If not improving will need sooner follow-up. Rx sent to pharm .   Please contact office for sooner follow up if symptoms do not improve or worsen or seek emergency care

## 2022-08-19 NOTE — Telephone Encounter (Signed)
Called and spoke with patients wife and she voiced understanding of recs. Nothing further needed

## 2022-08-20 ENCOUNTER — Encounter: Payer: Self-pay | Admitting: Internal Medicine

## 2022-08-20 NOTE — Progress Notes (Signed)
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 Received verbal permission from Dr. Melvyn Novas since Dr. Elsworth Soho is out of office today and Dr. Melvyn Novas has agreed to sign the letter. ATC patients wife to verify dates and also to let her know that letter would be available for pick up this afternoon (when Dr. Melvyn Novas will be in office to sign. Nothing further needed)

## 2022-08-29 ENCOUNTER — Ambulatory Visit (INDEPENDENT_AMBULATORY_CARE_PROVIDER_SITE_OTHER): Payer: 59

## 2022-08-29 ENCOUNTER — Ambulatory Visit
Admission: RE | Admit: 2022-08-29 | Discharge: 2022-08-29 | Disposition: A | Discharge status: Home / Self Care | Payer: 59 | Source: Ambulatory Visit | Attending: Nurse Practitioner | Admitting: Nurse Practitioner

## 2022-08-29 VITALS — BP 183/79 | HR 91 | Temp 98.9°F | Resp 22

## 2022-08-29 DIAGNOSIS — R062 Wheezing: Secondary | ICD-10-CM | POA: Diagnosis not present

## 2022-08-29 DIAGNOSIS — Z1152 Encounter for screening for COVID-19: Secondary | ICD-10-CM | POA: Diagnosis not present

## 2022-08-29 DIAGNOSIS — B349 Viral infection, unspecified: Secondary | ICD-10-CM | POA: Diagnosis not present

## 2022-08-29 DIAGNOSIS — R0602 Shortness of breath: Secondary | ICD-10-CM

## 2022-08-29 DIAGNOSIS — R059 Cough, unspecified: Secondary | ICD-10-CM | POA: Diagnosis not present

## 2022-08-29 MED ORDER — METHYLPREDNISOLONE SODIUM SUCC 125 MG IJ SOLR
125.0000 mg | Freq: Once | INTRAMUSCULAR | Status: AC
  Administered 2022-08-29: 125 mg via INTRAMUSCULAR

## 2022-08-29 MED ORDER — ALBUTEROL SULFATE (2.5 MG/3ML) 0.083% IN NEBU
2.5000 mg | INHALATION_SOLUTION | Freq: Once | RESPIRATORY_TRACT | Status: AC
  Administered 2022-08-29: 2.5 mg via RESPIRATORY_TRACT

## 2022-08-29 MED ORDER — OSELTAMIVIR PHOSPHATE 75 MG PO CAPS: 75.0000 mg | ORAL_CAPSULE | Freq: Two times a day (BID) | ORAL | 0 refills | Status: DC

## 2022-08-29 MED ORDER — BENZONATATE 100 MG PO CAPS: 100.0000 mg | ORAL_CAPSULE | Freq: Three times a day (TID) | ORAL | 0 refills | Status: DC | PRN

## 2022-08-29 NOTE — ED Provider Notes (Signed)
[facility]    CSN: [id number] Arrival date & time: 08/29/22  1525      History   Chief Complaint Chief Complaint  Patient presents with   Wheezing    Severe cough, headache, congestion. Recovering from lung cancer completed radiation. Also have copd just finished zpack an prednisone. Worse conditions - Entered by patient   Appointment    Harrison III is a 57 y.o. male.   The history is provided by the patient.   The patient presents for complaints of body aches, headache, cough, and chest congestion that been present for the past 2 days.  Patient denies fever, chills, ear pain, sore throat, difficulty breathing.  Patient reports that he has been wheezing and experiencing some shortness of breath.  Patient does have a history of lung cancer, completing his radiation in June of last year.  He recently finished a Z-Pak and prednisone.  He also states that he was exposed to COVID 3 days ago.  He reports that he did receive his influenza vaccine.  He states that he has been using his nebulizer machine, with his last use this morning. Past Medical History:  Diagnosis Date   Anxiety    Asthma    Bronchitis    COPD (chronic obstructive pulmonary disease) ([place])    COVID-19 08/2020   GERD (gastroesophageal reflux disease)    History of radiation therapy    Right chest 12/25/21-01/07/22- Dr. Gery Pray   History of radiation therapy    Left Lung- 12/25/21-01/07/22- Dr. Gery Pray   Lung cancer [facility]) 2023   Tobacco use     Patient Active Problem List   Diagnosis Date Noted   Squamous cell lung cancer, unspecified laterality ([place]) 10/04/2021   Tobacco abuse 05/24/2021   Chronic obstructive pulmonary disease ([place]) 02/17/2014   Chest pain 02/12/2014    Past Surgical History:  Procedure Laterality Date   BRONCHIAL BIOPSY  11/12/2021   Procedure: BRONCHIAL BIOPSIES;  Surgeon: [name], DO;  Location: [facility];  Service: Pulmonary;;    BRONCHIAL BRUSHINGS  11/12/2021   Procedure: BRONCHIAL BRUSHINGS;  Surgeon: [name], DO;  Location: [facility];  Service: Pulmonary;;   BRONCHIAL NEEDLE ASPIRATION BIOPSY  11/12/2021   Procedure: BRONCHIAL NEEDLE ASPIRATION BIOPSIES;  Surgeon: [name], DO;  Location: [facility];  Service: Pulmonary;;   ORIF ACETABULAR FRACTURE     TONSILLECTOMY         Home Medications    Prior to Admission medications   Medication Sig Start Date End Date Taking? Authorizing Provider  benzonatate (TESSALON PERLES) 100 MG capsule Take 1 capsule (100 mg total) by mouth 3 (three) times daily as needed for cough. 08/29/22  Yes Suzzette Gasparro-Warren, Alda Lea, NP  oseltamivir (TAMIFLU) 75 MG capsule Take 1 capsule (75 mg total) by mouth every 12 (twelve) hours. 08/29/22  Yes Shanasia Ibrahim-Warren, Alda Lea, NP  albuterol (PROVENTIL) (2.5 MG/3ML) 0.083% nebulizer solution USE 1 VIAL IN NEBULIZER EVERY 6 HOURS AS NEEDED FOR WHEEZING OR SHORTNESS OF BREATH 08/19/22   Rigoberto Noel, MD  albuterol (VENTOLIN HFA) 108 (90 Base) MCG/ACT inhaler Inhale 2 puffs into the lungs every 4 (four) hours as needed. 06/13/22   Rigoberto Noel, MD  alum & mag hydroxide-simeth (MAALOX/MYLANTA) 200-200-20 MG/5ML suspension Take 30 mLs by mouth every 6 (six) hours as needed for indigestion or heartburn.    [provider]  Budeson-Glycopyrrol-Formoterol (BREZTRI AEROSPHERE) 160-9-4.8 MCG/ACT AERO Inhale 2  puffs into the lungs in the morning and at bedtime. 06/13/22   Rigoberto Noel, MD  clonazePAM (KLONOPIN) 0.5 MG tablet Take 1 tablet by mouth twice daily as needed for anxiety 05/12/22   Kathyrn Drown, MD  cyclobenzaprine (FLEXERIL) 10 MG tablet Take 1 tablet (10 mg total) by mouth 3 (three) times daily as needed for muscle spasms. Do not drink alcohol or drive while taking this medication.  May cause drowsiness. 04/07/22   Kathyrn Drown, MD  naproxen (NAPROSYN) 500 MG tablet Take 1 tablet (500 mg total) by mouth 2 (two)  times daily with a meal. 01/17/22   Carvel Getting, NP  pantoprazole (PROTONIX) 40 MG tablet Take 1 tablet (40 mg total) by mouth daily. 09/27/21   Kathyrn Drown, MD  predniSONE (DELTASONE) 10 MG tablet 4 tabs for 2 days, then 3 tabs for 2 days, 2 tabs for 2 days, then 1 tab for 2 days, then stop 08/19/22   Parrett, Fonnie Mu, NP    Family History Family History  Problem Relation Age of Onset   Heart attack Father    Cancer Father    Cancer Brother    Cancer Mother     Social History Social History   Tobacco Use   Smoking status: Every Day    Packs/day: 1.00    Years: 30.00    Total pack years: 30.00    Types: Cigarettes   Smokeless tobacco: Never   Tobacco comments:    1 ppd 06/13/22.   Substance Use Topics   Alcohol use: Yes    Comment: occasionally   Drug use: No     Allergies   Patient has no known allergies.   Review of Systems Review of Systems Per HPI  Physical Exam Triage Vital Signs ED Triage Vitals  Enc Vitals Group     BP 08/29/22 1619 (!) 183/79     Pulse Rate 08/29/22 1619 91     Resp 08/29/22 1619 (!) 22     Temp 08/29/22 1619 98.9 F (37.2 C)     Temp Source 08/29/22 1619 Oral     SpO2 08/29/22 1619 94 %     Weight --      Height --      Head Circumference --      Peak Flow --      Pain Score 08/29/22 1623 5     Pain Loc --      Pain Edu? --      Excl. in [place]? --    No data found.  Updated Vital Signs BP (!) 183/79 (BP Location: Right Arm)   Pulse 91   Temp 98.9 F (37.2 C) (Oral)   Resp (!) 22   SpO2 94%   Visual Acuity Right Eye Distance:   Left Eye Distance:   Bilateral Distance:    Right Eye Near:   Left Eye Near:    Bilateral Near:     Physical Exam Vitals and nursing note reviewed.  Constitutional:      Appearance: Normal appearance.  HENT:     Head: Normocephalic.     Right Ear: Tympanic membrane, ear canal and external ear normal.     Left Ear: Tympanic membrane, ear canal and external ear normal.     Nose:  Congestion present. No rhinorrhea.     Mouth/Throat:     Mouth: Mucous membranes are moist.     Pharynx: No posterior oropharyngeal erythema.  Eyes:  Extraocular Movements: Extraocular movements intact.     Conjunctiva/sclera: Conjunctivae normal.     Pupils: Pupils are equal, round, and reactive to light.  Cardiovascular:     Rate and Rhythm: Normal rate and regular rhythm.     Pulses: Normal pulses.     Heart sounds: Normal heart sounds.  Pulmonary:     Effort: Pulmonary effort is normal.     Breath sounds: Wheezing present.  Abdominal:     General: Bowel sounds are normal.     Palpations: Abdomen is soft.     Tenderness: There is no abdominal tenderness.  Musculoskeletal:     Cervical back: Normal range of motion.  Lymphadenopathy:     Cervical: No cervical adenopathy.  Skin:    General: Skin is warm and dry.  Neurological:     General: No focal deficit present.     Mental Status: He is alert and oriented to person, place, and time.  Psychiatric:        Mood and Affect: Mood normal.        Behavior: Behavior normal.      UC Treatments / Results  Labs (all labs ordered are listed, but only abnormal results are displayed) Labs Reviewed  SARS CORONAVIRUS 2 (TAT 6-24 HRS)    EKG   Radiology DG Chest 2 View  Result Date: 08/29/2022 CLINICAL DATA:  Cough, wheezing and shortness of breath. History of lung cancer. EXAM: CHEST - 2 VIEW COMPARISON:  Chest x-ray 04/06/2022. PET-CT 05/09/2022 FINDINGS: Hyperinflation. Interstitial prominence which is likely chronic. No consolidation, pneumothorax or effusion. Slight area of opacity seen in the medial left lung base which may correspond to the finding by PET-CT scan of a nodule. Please correlate with prior workup. Normal cardiopericardial silhouette. Calcified aorta. Multiple air-filled distended loops of bowel are seen in the epigastric region of the abdomen. Please correlate with any symptomatology. IMPRESSION:  Hyperinflation with chronic lung changes. Subtle nodularity towards the lingula. Please correlate with history. Multiple air-filled distended loops of bowel in the epigastric region of the abdomen. Please correlate with any specific symptoms and additional evaluation as clinically directed Electronically Signed   By: Jill Side M.D.   On: 08/29/2022 17:05    Procedures Procedures (including critical care time)  Medications Ordered in UC Medications  albuterol (PROVENTIL) (2.5 MG/3ML) 0.083% nebulizer solution 2.5 mg (2.5 mg Nebulization Given 08/29/22 1711)  methylPREDNISolone sodium succinate (SOLU-MEDROL) 125 mg/2 mL injection 125 mg (125 mg Intramuscular Given 08/29/22 1711)    Initial Impression / Assessment and Plan / UC Course  I have reviewed the triage vital signs and the nursing notes.  Pertinent labs & imaging results that were available during my care of the patient were reviewed by me and considered in my medical decision making (see chart for details).  The patient is well-appearing, he is in no acute distress, he is hypertensive and mildly tachycardic, but is speaking in complete sentences without difficulty.  COVID test is pending.  Patient is a candidate to receive Paxlovid if his COVID test is positive.  For the patient's wheezing, Solu-Medrol 125 mg IM along with an albuterol nebulizer were administered. Suspect a viral illness at this time.  Symptoms consistent with influenza.  Will start patient on Tamiflu 75 mg empirically.  For his cough, benzonatate 100 mg was also prescribed.  Patient was advised to continue use of his albuterol nebulizer solution at home for any wheezing or shortness of breath.  Discussed x-ray findings with the patient  regarding his bowel distention.  Patient was advised to increase fluids, and consider use of a stool softener or medication such as Gas-X to relieve the gas and bloating he may be experiencing.  Patient was also given supportive care  recommendations to include use of use of humidifier in the bedroom at nighttime and sleeping elevated on pillows.  Patient was given strict ER precautions.  Patient verbalizes understanding.  All questions were answered.  Patient stable for discharge.  Final Clinical Impressions(s) / UC Diagnoses   Final diagnoses:  Encounter for screening for COVID-19  Viral illness     Discharge Instructions      The chest x-ray does not show any signs of pneumonia. COVID test is pending.  You are a candidate to receive Paxlovid if your COVID test is positive.  You will be contacted if the results are positive.  Results are also available using your MyChart account. Take medication as prescribed. Increase fluids and allow for plenty of rest. Continue use of your albuterol nebulizer and albuterol inhaler as needed for wheezing or shortness of breath. Go to the emergency department if you experience worsening shortness of breath, difficulty breathing, or other concerns. Recommend that you follow-up with your pulmonologist within the next 5 to 7 days for reevaluation or if symptoms are not improving. Follow-up as needed.      ED Prescriptions     Medication Sig Dispense Auth. Provider   oseltamivir (TAMIFLU) 75 MG capsule Take 1 capsule (75 mg total) by mouth every 12 (twelve) hours. 10 capsule Viktoria Gruetzmacher-Warren, Alda Lea, NP   benzonatate (TESSALON PERLES) 100 MG capsule Take 1 capsule (100 mg total) by mouth 3 (three) times daily as needed for cough. 30 capsule Rasul Decola-Warren, Alda Lea, NP      PDMP not reviewed this encounter.   Tish Men, NP 08/29/22 1735

## 2022-08-29 NOTE — Discharge Instructions (Addendum)
The chest x-ray does not show any signs of pneumonia. COVID test is pending.  You are a candidate to receive Paxlovid if your COVID test is positive.  You will be contacted if the results are positive.  Results are also available using your MyChart account. Take medication as prescribed. Increase fluids and allow for plenty of rest. Continue use of your albuterol nebulizer and albuterol inhaler as needed for wheezing or shortness of breath. Go to the emergency department if you experience worsening shortness of breath, difficulty breathing, or other concerns. Recommend that you follow-up with your pulmonologist within the next 5 to 7 days for reevaluation or if symptoms are not improving. Follow-up as needed.

## 2022-08-29 NOTE — ED Triage Notes (Signed)
Pt reports  cough, headache, congestion x 2 days. Reports he finished radiation for lung cancer in June, 2023. Pt has history of COPD. Pt finished Z-Pak and prednisone 2 days ago, gave some relief.   Reports he was exposed to COVID 3 days ago.

## 2022-08-30 LAB — SARS CORONAVIRUS 2 (TAT 6-24 HRS): SARS Coronavirus 2: NEGATIVE

## 2022-09-12 ENCOUNTER — Ambulatory Visit (INDEPENDENT_AMBULATORY_CARE_PROVIDER_SITE_OTHER): Payer: 59 | Admitting: Gastroenterology

## 2022-09-12 ENCOUNTER — Encounter: Payer: Self-pay | Admitting: Gastroenterology

## 2022-09-12 VITALS — BP 120/80 | HR 100 | Ht 72.0 in | Wt 242.0 lb

## 2022-09-12 DIAGNOSIS — Z1211 Encounter for screening for malignant neoplasm of colon: Secondary | ICD-10-CM | POA: Diagnosis not present

## 2022-09-12 DIAGNOSIS — R948 Abnormal results of function studies of other organs and systems: Secondary | ICD-10-CM | POA: Diagnosis not present

## 2022-09-12 DIAGNOSIS — R142 Eructation: Secondary | ICD-10-CM

## 2022-09-12 DIAGNOSIS — R140 Abdominal distension (gaseous): Secondary | ICD-10-CM

## 2022-09-12 MED ORDER — NA SULFATE-K SULFATE-MG SULF 17.5-3.13-1.6 GM/177ML PO SOLN: 1.0000 | Freq: Once | ORAL | 0 refills | Status: AC

## 2022-09-12 NOTE — Patient Instructions (Addendum)
You have been scheduled for an endoscopy and colonoscopy. Please follow the written instructions given to you at your visit today. Please pick up your prep supplies at the pharmacy within the next 1-3 days. If you use inhalers (even only as needed), please bring them with you on the day of your procedure.   _______________________________________________________  If your blood pressure at your visit was 140/90 or greater, please contact your primary care physician to follow up on this.  _______________________________________________________  If you are age 58 or younger, your body mass index should be between 19-25. Your Body mass index is 32.82 kg/m. If this is out of the aformentioned range listed, please consider follow up with your Primary Care Provider.   __________________________________________________________  The [facility] providers would like to encourage you to use [facility] to communicate with providers for non-urgent requests or questions.  Due to long hold times on the telephone, sending your provider a message by [facility] may be a faster and more efficient way to get a response.  Please allow 48 business hours for a response.  Please remember that this is for non-urgent requests.   Due to recent changes in healthcare laws, you may see the results of your imaging and laboratory studies on MyChart before your provider has had a chance to review them.  We understand that in some cases there may be results that are confusing or concerning to you. Not all laboratory results come back in the same time frame and the provider may be waiting for multiple results in order to interpret others.  Please give [place] 48 hours in order for your provider to thoroughly review all the results before contacting the office for clarification of your results.    Thank you for choosing me and [facility].  Vito Cirigliano, D.O.

## 2022-09-12 NOTE — Progress Notes (Signed)
Chief Complaint: Abnormal imaging study   Referring Provider:   Dr. Gery Pray  HPI:     James Serrano is a 57 y.o. 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  - 09/27/2021: PET/CT: Exaggerated anal activity with SUV 7.8 without well-defined mass on CT, bone, incidental/physiologic.  Otherwise normal GI tract. -05/09/2022: PET/CT: Prominent focal hypermetabolic activity in the anus with SUV 9.1 (previously 7.8) without associated mass lesion on CT.  Prominent activity throughout gastric body with SUV 7.2 but no definite mucosal abnormality, suggestive of gastritis.  Otherwise normal GI tract.  He was seen by Dr. Earlie Server in the Oncology Clinic on 06/30/2022 for follow-up and review of the PET scan.  Does report a prior hx of symptomatic hemorrhoids with perianal pain and bleeding years ago. Would treat with Sitz bath, Preparation H. Now very rare sxs, with last approximately 1 month ago with perianal itching and scant BRB on tissue paper.   Had lost weight with radiation, but has been stable since then. Good appetite. No changes in bowel habits. No n/v. Occasional upper abdominal bloating when he is on a long car ride/sitting too long.  Also with intermittent belching, but no heartburn, regurgitation, dysphagia.  No prior colonoscopy or EGD.   No known family history of CRC, GI malignancy, liver disease, pancreatic disease, or IBD.        Latest Ref Rng & Units 12/05/2021   11:00 AM 11/12/2021    8:29 AM 08/20/2020    4:41 PM  CBC  WBC 4.0 - 10.5 K/uL 5.1  4.5  8.5   Hemoglobin  13.0 - 17.0 g/dL 16.7  16.4  17.7   Hematocrit 39.0 - 52.0 % 48.3  47.7  52.2   Platelets 150 - 400 K/uL 170  192  215       Latest Ref Rng & Units 12/05/2021   11:00 AM 03/16/2020   10:10 AM 02/12/2014   11:31 AM  CMP  Glucose 70 - 99 mg/dL 109  104  106   BUN 6 - 20 mg/dL 7  7  8    Creatinine 0.61 - 1.24 mg/dL 0.97  0.94  0.84   Sodium 135 - 145 mmol/L 137  139  139   Potassium 3.5 - 5.1 mmol/L 4.3  4.9  4.3   Chloride 98 - 111 mmol/L 103  99  99   CO2 22 - 32 mmol/L 30  26  27    Calcium 8.9 - 10.3 mg/dL 9.5  9.8  9.6   Total Protein 6.5 - 8.1 g/dL 7.1  7.6    Total Bilirubin 0.3 - 1.2 mg/dL 0.7  0.4    Alkaline Phos 38 - 126 U/L 62  72    AST 15 - 41 U/L 18  18    ALT 0 - 44 U/L 16  20       Past Medical History:  Diagnosis Date   Anxiety    Asthma    Bronchitis    COPD (chronic obstructive pulmonary disease) ([facility])  COVID-19 08/2020   GERD (gastroesophageal reflux disease)    History of radiation therapy    Right chest 12/25/21-01/07/22- Dr. Antony Blackbird   History of radiation therapy    Left Lung- 12/25/21-01/07/22- Dr. Antony Blackbird   Lung cancer [facility]) 2023   Tobacco use      Past Surgical History:  Procedure Laterality Date   BRONCHIAL BIOPSY  11/12/2021   Procedure: BRONCHIAL BIOPSIES;  Surgeon: Josephine Igo, DO;  Location: [facility];  Service: Pulmonary;;   BRONCHIAL BRUSHINGS  11/12/2021   Procedure: BRONCHIAL BRUSHINGS;  Surgeon: Josephine Igo, DO;  Location: [facility];  Service: Pulmonary;;   BRONCHIAL NEEDLE ASPIRATION BIOPSY  11/12/2021   Procedure: BRONCHIAL NEEDLE ASPIRATION BIOPSIES;  Surgeon: Josephine Igo, DO;  Location: [facility];  Service: Pulmonary;;   ORIF ACETABULAR FRACTURE     TONSILLECTOMY     Family History  Problem Relation Age of Onset   Heart attack Father    Cancer Father    Cancer Brother    Cancer Mother    Social History   Tobacco Use   Smoking status: Every Day    Packs/day: 1.00    Years: 30.00     Total pack years: 30.00    Types: Cigarettes   Smokeless tobacco: Never   Tobacco comments:    1 ppd 06/13/22.   Substance Use Topics   Alcohol use: Yes    Comment: occasionally   Drug use: No   Current Outpatient Medications  Medication Sig Dispense Refill   albuterol (PROVENTIL) (2.5 MG/3ML) 0.083% nebulizer solution USE 1 VIAL IN NEBULIZER EVERY 6 HOURS AS NEEDED FOR WHEEZING OR SHORTNESS OF BREATH 180 mL 0   albuterol (VENTOLIN HFA) 108 (90 Base) MCG/ACT inhaler Inhale 2 puffs into the lungs every 4 (four) hours as needed. 9 g 12   alum & mag hydroxide-simeth (MAALOX/MYLANTA) 200-200-20 MG/5ML suspension Take 30 mLs by mouth every 6 (six) hours as needed for indigestion or heartburn.     benzonatate (TESSALON PERLES) 100 MG capsule Take 1 capsule (100 mg total) by mouth 3 (three) times daily as needed for cough. 30 capsule 0   Budeson-Glycopyrrol-Formoterol (BREZTRI AEROSPHERE) 160-9-4.8 MCG/ACT AERO Inhale 2 puffs into the lungs in the morning and at bedtime. 10.7 g 11   clonazePAM (KLONOPIN) 0.5 MG tablet Take 1 tablet by mouth twice daily as needed for anxiety 20 tablet 0   cyclobenzaprine (FLEXERIL) 10 MG tablet Take 1 tablet (10 mg total) by mouth 3 (three) times daily as needed for muscle spasms. Do not drink alcohol or drive while taking this medication.  May cause drowsiness. 15 tablet 1   No current facility-administered medications for this visit.   No Known Allergies   Review of Systems: All systems reviewed and negative except where noted in HPI.     Physical Exam:    Wt Readings from Last 3 Encounters:  09/12/22 242 lb (109.8 kg)  06/30/22 247 lb 12.8 oz (112.4 kg)  06/13/22 245 lb (111.1 kg)    BP 120/80   Pulse 100   Ht 6' (1.829 m)   Wt 242 lb (109.8 kg)   BMI 32.82 kg/m  Constitutional:  Pleasant, in no acute distress. Psychiatric: Normal mood and affect. Behavior is normal. EENT: Pupils normal.  Conjunctivae are normal. No scleral  icterus. Pulmonary/chest: Effort normal and breath sounds normal. No wheezing, rales or rhonchi. Abdominal: Soft, nondistended, nontender. Bowel sounds active throughout. There are no masses palpable. No hepatomegaly. Neurological:  Alert and oriented to person place and time. Skin: Skin is warm and dry. No rashes noted. Rectal: Exam deferred to time of colonoscopy.     ASSESSMENT AND PLAN;   1) Abnormal PET/CT PET/CT in 09/2021 with exaggerated anal activity, and repeat PET/CT in 04/2022 with continued focal hypermetabolic activity in the anal canal with increased SUV of 9.1 (compared to 7.8).  Discussed DDx with the patient today, and while hemorrhoids and physiologic activity could potentially be the source, certainly cannot rule out other etiologies without colonoscopy.  Additionally, he is due for routine CRC screening colonoscopy as below.  Separately, more recent PET/CT with prominent activity throughout the gastric body with SUV 7.2 which was not seen on the previous PET/CT.  Could be benign gastritis, but given some concomitant UGI symptoms, reasonable to proceed with upper endoscopy as well.  - Schedule colonoscopy and EGD to evaluate abnormal PET/CT findings  2) Colon cancer screening - As above, scheduling colonoscopy  3) Abdominal bloating 4) Belching - Evaluate for mucosal/luminal pathology at time of EGD  5) Lung cancer 6) COPD - Continue follow-up in the Oncology and Pulmonary Clinics  The indications, risks, and benefits of EGD and colonoscopy were explained to the patient in detail. Risks include but are not limited to bleeding, perforation, adverse reaction to medications, and cardiopulmonary compromise. Sequelae include but are not limited to the possibility of surgery, hospitalization, and mortality. The patient verbalized understanding and wished to proceed. All questions answered, referred to scheduler and bowel prep ordered. Further recommendations pending results of  the exam.      Shellia Cleverly, DO, FACG  09/12/2022, 3:40 PM   Cc: Antony Blackbird, MD Babs Sciara, MD

## 2022-09-13 ENCOUNTER — Telehealth: Payer: 59 | Admitting: Physician Assistant

## 2022-09-13 DIAGNOSIS — R051 Acute cough: Secondary | ICD-10-CM

## 2022-09-14 ENCOUNTER — Encounter: Payer: Self-pay | Admitting: Physician Assistant

## 2022-09-14 MED ORDER — PREDNISONE 20 MG PO TABS: 40.0000 mg | ORAL_TABLET | Freq: Every day | ORAL | 0 refills | Status: AC

## 2022-09-14 MED ORDER — BENZONATATE 100 MG PO CAPS: 100.0000 mg | ORAL_CAPSULE | Freq: Three times a day (TID) | ORAL | 0 refills | Status: DC | PRN

## 2022-09-14 NOTE — Progress Notes (Signed)
We are sorry that you are not feeling well.  Here is how we plan to help!  Based on your presentation I believe you most likely have A cough due to a virus.  This is called viral bronchitis and is best treated by rest, plenty of fluids and control of the cough.  You may use Ibuprofen or Tylenol as directed to help your symptoms.     In addition you may use A prescription cough medication called Tessalon Perles 100mg . You may take 1-2 capsules every 8 hours as needed for your cough.  Prednisone 20mg  2 PO daily for 5 days   From your responses in the eVisit questionnaire you describe inflammation in the upper respiratory tract which is causing a significant cough.  This is commonly called Bronchitis and has four common causes:   Allergies Viral Infections Acid Reflux Bacterial Infection Allergies, viruses and acid reflux are treated by controlling symptoms or eliminating the cause. An example might be a cough caused by taking certain blood pressure medications. You stop the cough by changing the medication. Another example might be a cough caused by acid reflux. Controlling the reflux helps control the cough.  USE OF BRONCHODILATOR ("RESCUE") INHALERS: There is a risk from using your bronchodilator too frequently.  The risk is that over-reliance on a medication which only relaxes the muscles surrounding the breathing tubes can reduce the effectiveness of medications prescribed to reduce swelling and congestion of the tubes themselves.  Although you feel brief relief from the bronchodilator inhaler, your asthma may actually be worsening with the tubes becoming more swollen and filled with mucus.  This can delay other crucial treatments, such as oral steroid medications. If you need to use a bronchodilator inhaler daily, several times per day, you should discuss this with your provider.  There are probably better treatments that could be used to keep your asthma under control.     HOME CARE Only take  medications as instructed by your medical team. Complete the entire course of an antibiotic. Drink plenty of fluids and get plenty of rest. Avoid close contacts especially the very young and the elderly Cover your mouth if you cough or cough into your sleeve. Always remember to wash your hands A steam or ultrasonic humidifier can help congestion.   GET HELP RIGHT AWAY IF: You develop worsening fever. You become short of breath You cough up blood. Your symptoms persist after you have completed your treatment plan MAKE SURE YOU  Understand these instructions. Will watch your condition. Will get help right away if you are not doing well or get worse.    Thank you for choosing an e-visit.  Your e-visit answers were reviewed by a board certified advanced clinical practitioner to complete your personal care plan. Depending upon the condition, your plan could have included both over the counter or prescription medications.  Please review your pharmacy choice. Make sure the pharmacy is open so you can pick up prescription now. If there is a problem, you may contact your provider through [employer] and have the prescription routed to another pharmacy.  Your safety is important to [place]. If you have drug allergies check your prescription carefully.   For the next 24 hours you can use MyChart to ask questions about today's visit, request a non-urgent call back, or ask for a work or school excuse. You will get an email in the next two days asking about your experience. I hope that your e-visit has been valuable and  will speed your recovery.  I have spent 5 minutes in review of e-visit questionnaire, review and updating patient chart, medical decision making and response to patient.   Kasandra Knudsen Mayers, PA-C

## 2022-09-26 ENCOUNTER — Other Ambulatory Visit: Payer: Self-pay | Admitting: Pulmonary Disease

## 2022-09-27 NOTE — Telephone Encounter (Signed)
Refill x 5 on albuterol neb sent in

## 2022-10-24 ENCOUNTER — Ambulatory Visit
Admission: RE | Admit: 2022-10-24 | Discharge: 2022-10-24 | Disposition: A | Discharge status: Home / Self Care | Payer: 59 | Source: Ambulatory Visit | Attending: Nurse Practitioner | Admitting: Nurse Practitioner

## 2022-10-24 ENCOUNTER — Ambulatory Visit (INDEPENDENT_AMBULATORY_CARE_PROVIDER_SITE_OTHER): Payer: 59

## 2022-10-24 ENCOUNTER — Ambulatory Visit: Payer: 59

## 2022-10-24 VITALS — BP 139/90 | HR 87 | Temp 99.0°F | Resp 20

## 2022-10-24 DIAGNOSIS — J441 Chronic obstructive pulmonary disease with (acute) exacerbation: Secondary | ICD-10-CM | POA: Diagnosis not present

## 2022-10-24 DIAGNOSIS — R0789 Other chest pain: Secondary | ICD-10-CM

## 2022-10-24 DIAGNOSIS — R059 Cough, unspecified: Secondary | ICD-10-CM

## 2022-10-24 MED ORDER — ALBUTEROL SULFATE (2.5 MG/3ML) 0.083% IN NEBU
2.5000 mg | INHALATION_SOLUTION | Freq: Once | RESPIRATORY_TRACT | Status: AC
  Administered 2022-10-24: 2.5 mg via RESPIRATORY_TRACT

## 2022-10-24 MED ORDER — TIZANIDINE HCL 4 MG PO TABS: 4.0000 mg | ORAL_TABLET | Freq: Three times a day (TID) | ORAL | 0 refills | Status: DC | PRN

## 2022-10-24 MED ORDER — LIDOCAINE 5 % EX PTCH: 1.0000 | MEDICATED_PATCH | CUTANEOUS | 0 refills | Status: DC

## 2022-10-24 MED ORDER — PREDNISONE 20 MG PO TABS: 40.0000 mg | ORAL_TABLET | Freq: Every day | ORAL | 0 refills | Status: AC

## 2022-10-24 NOTE — Discharge Instructions (Signed)
As we discussed, the chest x-ray does not show any acute bony fractures in your rib cage.  Your lungs also looked great.  You can continue to take Tylenol or ibuprofen for pain, you can also try the tizanidine or lidocaine patch on your skin to help with pain.  We gave you a breathing treatment today which helped open up your lungs.  Continue to use the nebulizer at home every 4-6 hours as needed for wheezing or shortness of breath.  Continue to try to take deep breaths even though it hurts to help prevent pneumonia.  Start the oral steroid to help with inflammation in your lungs.  Seek care if symptoms worsen or persist despite treatment.

## 2022-10-24 NOTE — ED Provider Notes (Signed)
[facility]    CSN: [id number] Arrival date & time: 10/24/22  1428      History   Chief Complaint Chief Complaint  Patient presents with   Fall    Had coughing spell blacked out fell hurting in rib area when breathing - Entered by patient   Appointment    1500    HPI James Serrano is a 57 y.o. male.   Patient presents today for rib cage pain after fall from coughing last night.  Reports history of lung cancer and has a chronic cough.  Coughed so hard last night he "blacked out" and fell.  Does not know how he landed.  Now, is having pain in both rib cages worse on the left and over the sternum.  Pain is worse with deep breathing, coughing, laughing.  No new shortness of breath, fever, chills, sore throat, headache, nausea/vomiting, dizziness, vision changes.  Reports he has been wheezing, however has been unable to take his rescue inhaler today because he was traveling home from a job in a different part of the [place].    Past Medical History:  Diagnosis Date   Anxiety    Asthma    Bronchitis    COPD (chronic obstructive pulmonary disease) ([place])    COVID-19 08/2020   GERD (gastroesophageal reflux disease)    History of radiation therapy    Right chest 12/25/21-01/07/22- Dr. Gery Pray   History of radiation therapy    Left Lung- 12/25/21-01/07/22- Dr. Gery Pray   Lung cancer [facility]) 2023   Tobacco use     Patient Active Problem List   Diagnosis Date Noted   Squamous cell lung cancer, unspecified laterality ([place]) 10/04/2021   Tobacco abuse 05/24/2021   Chronic obstructive pulmonary disease ([place]) 02/17/2014   Chest pain 02/12/2014    Past Surgical History:  Procedure Laterality Date   BRONCHIAL BIOPSY  11/12/2021   Procedure: BRONCHIAL BIOPSIES;  Surgeon: [name], DO;  Location: [facility];  Service: Pulmonary;;   BRONCHIAL BRUSHINGS  11/12/2021   Procedure: BRONCHIAL BRUSHINGS;  Surgeon: [name], DO;  Location: [facility];  Service: Pulmonary;;   BRONCHIAL NEEDLE ASPIRATION BIOPSY  11/12/2021   Procedure: BRONCHIAL NEEDLE ASPIRATION BIOPSIES;  Surgeon: [name], DO;  Location: [facility];  Service: Pulmonary;;   ORIF ACETABULAR FRACTURE     TONSILLECTOMY         Home Medications    Prior to Admission medications   Medication Sig Start Date End Date Taking? Authorizing Provider  lidocaine (LIDODERM) 5 % Place 1 patch onto the skin daily. Remove & Discard patch within 12 hours or as directed by MD 10/24/22  Yes Eulogio Bear, NP  predniSONE (DELTASONE) 20 MG tablet Take 2 tablets (40 mg total) by mouth daily with breakfast for 5 days. 10/24/22 10/29/22 Yes Eulogio Bear, NP  tiZANidine (ZANAFLEX) 4 MG tablet Take 1 tablet (4 mg total) by mouth every 8 (eight) hours as needed for muscle spasms. Do not take with alcohol or while driving or operating heavy machinery.  May cause drowsiness. 10/24/22  Yes Noemi Chapel A, NP  albuterol (PROVENTIL) (2.5 MG/3ML) 0.083% nebulizer solution USE 1 VIAL IN NEBULIZER EVERY 6 HOURS AS NEEDED FOR WHEEZING OR SHORTNESS OF BREATH 09/27/22   Rigoberto Noel, MD  albuterol (VENTOLIN HFA) 108 (90 Base) MCG/ACT inhaler Inhale 2 puffs into the lungs every 4 (four) hours as needed. 06/13/22   Rigoberto Noel, MD  alum & mag hydroxide-simeth (MAALOX/MYLANTA) 200-200-20 MG/5ML suspension Take 30 mLs by mouth every 6 (six) hours as needed for indigestion or heartburn.    [provider]  benzonatate (TESSALON PERLES) 100 MG capsule Take 1 capsule (100 mg total) by mouth 3 (three) times daily as needed for cough. 09/14/22   Mayers, Cari S, PA-C  Budeson-Glycopyrrol-Formoterol (BREZTRI AEROSPHERE) 160-9-4.8 MCG/ACT AERO Inhale 2 puffs into the lungs in the morning and at bedtime. 06/13/22   Rigoberto Noel, MD  clonazePAM [place]bye [place]) 0.5 MG tablet Take 1 tablet by mouth twice daily as needed for anxiety 05/12/22   Kathyrn Drown, MD    Family History Family  History  Problem Relation Age of Onset   Heart attack Father    Cancer Father    Cancer Brother    Cancer Mother     Social History Social History   Tobacco Use   Smoking status: Every Day    Packs/day: 1.00    Years: 30.00    Total pack years: 30.00    Types: Cigarettes   Smokeless tobacco: Never   Tobacco comments:    1 ppd 06/13/22.   Substance Use Topics   Alcohol use: Yes    Comment: occasionally   Drug use: No     Allergies   Patient has no known allergies.   Review of Systems Review of Systems Per HPI  Physical Exam Triage Vital Signs ED Triage Vitals  Enc Vitals Group     BP 10/24/22 1509 (!) 139/90     Pulse Rate 10/24/22 1509 87     Resp 10/24/22 1509 20     Temp 10/24/22 1509 99 F (37.2 C)     Temp Source 10/24/22 1509 Oral     SpO2 10/24/22 1509 93 %     Weight --      Height --      Head Circumference --      Peak Flow --      Pain Score 10/24/22 1508 10     Pain Loc --      Pain Edu? --      Excl. in [place]? --    No data found.  Updated Vital Signs BP (!) 139/90 (BP Location: Right Arm)   Pulse 87   Temp 99 F (37.2 C) (Oral)   Resp 20   SpO2 93%   Visual Acuity Right Eye Distance:   Left Eye Distance:   Bilateral Distance:    Right Eye Near:   Left Eye Near:    Bilateral Near:     Physical Exam Vitals and nursing note reviewed.  Constitutional:      General: He is not in acute distress.    Appearance: Normal appearance. He is not toxic-appearing.  HENT:     Mouth/Throat:     Mouth: Mucous membranes are moist.     Pharynx: Oropharynx is clear. No oropharyngeal exudate or posterior oropharyngeal erythema.  Eyes:     General: No scleral icterus.    Extraocular Movements: Extraocular movements intact.  Pulmonary:     Effort: No respiratory distress.     Breath sounds: Decreased air movement present. Wheezing present. No rhonchi or rales.  Chest:     Chest wall: Tenderness present. No mass, lacerations, deformity,  swelling or edema.       Comments: Chest wall is diffusely tender to palpation; no swelling, obvious deformity, bruising, or redness noted. Skin:    General: Skin is warm and dry.  Capillary Refill: Capillary refill takes less than 2 seconds.     Coloration: Skin is not jaundiced or pale.     Findings: No erythema or rash.  Neurological:     Mental Status: He is alert and oriented to person, place, and time.  Psychiatric:        Behavior: Behavior is cooperative.      UC Treatments / Results  Labs (all labs ordered are listed, but only abnormal results are displayed) Labs Reviewed - No data to display  EKG   Radiology DG Chest 2 View  Result Date: 10/24/2022 CLINICAL DATA:  Cough EXAM: CHEST - 2 VIEW COMPARISON:  CXR 08/29/22 FINDINGS: No pleural effusion. No pneumothorax. Unchanged cardiac and mediastinal contours. Redemonstrated bilateral perihilar pulmonary opacities, not significantly changed from prior exam. No radiographically apparent displaced rib fractures. Visualized upper abdomen is unremarkable. Vertebral body heights are maintained. IMPRESSION: Redemonstrated bilateral perihilar pulmonary opacities, not significantly changed from prior exam. No new airspace opacity. Electronically Signed   By: Marin Roberts M.D.   On: 10/24/2022 15:39    Procedures Procedures (including critical care time)  Medications Ordered in UC Medications  albuterol (PROVENTIL) (2.5 MG/3ML) 0.083% nebulizer solution 2.5 mg (2.5 mg Nebulization Given 10/24/22 1544)    Initial Impression / Assessment and Plan / UC Course  I have reviewed the triage vital signs and the nursing notes.  Pertinent labs & imaging results that were available during my care of the patient were reviewed by me and considered in my medical decision making (see chart for details).   Patient is well-appearing, normotensive, afebrile, not tachycardic, not tachypneic, oxygenating well on room air.   1. Chest wall  pain No rib fractures identified on chest x-ray today Supportive care discussed including continuing analgesic/anti-inflammatory Can also start tizanidine for possible pulled muscle Can also start lidocaine patch to help with pain directly over painful area Encourage deep breathing to prevent pneumonia  2. COPD exacerbation ([place]) Breathing treatment given for wheezing today in urgent care which helped open airway; oxygenation increased to 96% on room air Continue breathing treatments at home every 4-6 hours as needed for wheezing or shortness of breath Start oral prednisone tomorrow Seek care if symptoms persist or worsen; strict ER precautions discussed with patient  The patient was given the opportunity to ask questions.  All questions answered to their satisfaction.  The patient is in agreement to this plan.    Final Clinical Impressions(s) / UC Diagnoses   Final diagnoses:  Chest wall pain  COPD exacerbation ([place])     Discharge Instructions      As we discussed, the chest x-ray does not show any acute bony fractures in your rib cage.  Your lungs also looked great.  You can continue to take Tylenol or ibuprofen for pain, you can also try the tizanidine or lidocaine patch on your skin to help with pain.  We gave you a breathing treatment today which helped open up your lungs.  Continue to use the nebulizer at home every 4-6 hours as needed for wheezing or shortness of breath.  Continue to try to take deep breaths even though it hurts to help prevent pneumonia.  Start the oral steroid to help with inflammation in your lungs.  Seek care if symptoms worsen or persist despite treatment.     ED Prescriptions     Medication Sig Dispense Auth. Provider   predniSONE (DELTASONE) 20 MG tablet Take 2 tablets (40 mg total) by mouth daily  with breakfast for 5 days. 10 tablet Noemi Chapel A, NP   tiZANidine (ZANAFLEX) 4 MG tablet Take 1 tablet (4 mg total) by mouth every 8 (eight)  hours as needed for muscle spasms. Do not take with alcohol or while driving or operating heavy machinery.  May cause drowsiness. 30 tablet Noemi Chapel A, NP   lidocaine (LIDODERM) 5 % Place 1 patch onto the skin daily. Remove & Discard patch within 12 hours or as directed by MD 30 patch Eulogio Bear, NP      PDMP not reviewed this encounter.   Eulogio Bear, NP 10/24/22 [phone]

## 2022-10-24 NOTE — ED Triage Notes (Signed)
Pt reports rib cage pain since last night after black out in the bathroom when he was coughing. Pain is worse when taking deep breaths, laughing, coughing. Ibuprofen gives no relief. Denies headache, nausea, vomiting, dizziness, vision changes.   Pt reports he has a laceration in the upper lip due to the fall last night.

## 2022-10-26 ENCOUNTER — Other Ambulatory Visit: Payer: Self-pay

## 2022-10-26 ENCOUNTER — Emergency Department (HOSPITAL_COMMUNITY): Payer: 59

## 2022-10-26 ENCOUNTER — Encounter (HOSPITAL_COMMUNITY): Payer: Self-pay

## 2022-10-26 ENCOUNTER — Emergency Department (HOSPITAL_COMMUNITY)
Admission: EM | Admit: 2022-10-26 | Discharge: 2022-10-26 | Disposition: A | Discharge status: Home / Self Care | Payer: 59 | Attending: Emergency Medicine | Admitting: Emergency Medicine

## 2022-10-26 DIAGNOSIS — W19XXXA Unspecified fall, initial encounter: Secondary | ICD-10-CM | POA: Insufficient documentation

## 2022-10-26 DIAGNOSIS — S2241XA Multiple fractures of ribs, right side, initial encounter for closed fracture: Secondary | ICD-10-CM | POA: Diagnosis not present

## 2022-10-26 DIAGNOSIS — Z85118 Personal history of other malignant neoplasm of bronchus and lung: Secondary | ICD-10-CM | POA: Insufficient documentation

## 2022-10-26 DIAGNOSIS — R0789 Other chest pain: Secondary | ICD-10-CM | POA: Diagnosis present

## 2022-10-26 LAB — CBC WITH DIFFERENTIAL/PLATELET
Abs Immature Granulocytes: 0.02 10*3/uL (ref 0.00–0.07)
Basophils Absolute: 0 10*3/uL (ref 0.0–0.1)
Basophils Relative: 0 %
Eosinophils Absolute: 0.2 10*3/uL (ref 0.0–0.5)
Eosinophils Relative: 4 %
HCT: 45.4 % (ref 39.0–52.0)
Hemoglobin: 15.8 g/dL (ref 13.0–17.0)
Immature Granulocytes: 0 %
Lymphocytes Relative: 15 %
Lymphs Abs: 0.9 10*3/uL (ref 0.7–4.0)
MCH: 35 pg — ABNORMAL HIGH (ref 26.0–34.0)
MCHC: 34.8 g/dL (ref 30.0–36.0)
MCV: 100.4 fL — ABNORMAL HIGH (ref 80.0–100.0)
Monocytes Absolute: 0.7 10*3/uL (ref 0.1–1.0)
Monocytes Relative: 12 %
Neutro Abs: 3.9 10*3/uL (ref 1.7–7.7)
Neutrophils Relative %: 69 %
Platelets: 159 10*3/uL (ref 150–400)
RBC: 4.52 MIL/uL (ref 4.22–5.81)
RDW: 12.3 % (ref 11.5–15.5)
WBC: 5.7 10*3/uL (ref 4.0–10.5)
nRBC: 0 % (ref 0.0–0.2)

## 2022-10-26 LAB — COMPREHENSIVE METABOLIC PANEL
ALT: 17 U/L (ref 0–44)
AST: 20 U/L (ref 15–41)
Albumin: 3.9 g/dL (ref 3.5–5.0)
Alkaline Phosphatase: 59 U/L (ref 38–126)
Anion gap: 7 (ref 5–15)
BUN: 9 mg/dL (ref 6–20)
CO2: 26 mmol/L (ref 22–32)
Calcium: 8.6 mg/dL — ABNORMAL LOW (ref 8.9–10.3)
Chloride: 100 mmol/L (ref 98–111)
Creatinine, Ser: 0.77 mg/dL (ref 0.61–1.24)
GFR, Estimated: >60 mL/min (ref >60)
Glucose, Bld: 99 mg/dL (ref 70–99)
Potassium: 4 mmol/L (ref 3.5–5.1)
Sodium: 133 mmol/L — ABNORMAL LOW (ref 135–145)
Total Bilirubin: 0.9 mg/dL (ref 0.3–1.2)
Total Protein: 6.8 g/dL (ref 6.5–8.1)

## 2022-10-26 LAB — TROPONIN I (HIGH SENSITIVITY): Troponin I (High Sensitivity): 4 ng/L (ref <18)

## 2022-10-26 MED ORDER — HYDROMORPHONE HCL 1 MG/ML IJ SOLN
1.0000 mg | Freq: Once | INTRAMUSCULAR | Status: AC
  Administered 2022-10-26: 1 mg via INTRAVENOUS
  Filled 2022-10-26: qty 1

## 2022-10-26 MED ORDER — KETOROLAC TROMETHAMINE 30 MG/ML IJ SOLN
15.0000 mg | Freq: Once | INTRAMUSCULAR | Status: AC
  Administered 2022-10-26: 15 mg via INTRAVENOUS
  Filled 2022-10-26: qty 1

## 2022-10-26 MED ORDER — ONDANSETRON HCL 4 MG/2ML IJ SOLN
4.0000 mg | Freq: Once | INTRAMUSCULAR | Status: AC
  Administered 2022-10-26: 4 mg via INTRAVENOUS
  Filled 2022-10-26: qty 2

## 2022-10-26 MED ORDER — OXYCODONE-ACETAMINOPHEN 5-325 MG PO TABS: 1.0000 | ORAL_TABLET | ORAL | 0 refills | Status: DC | PRN

## 2022-10-26 NOTE — ED Triage Notes (Signed)
Pt arrived via POV c/o sharp right side ribcage pain. Pt seen 2 days ago at [facility] for ribcage pain and was told it was COPD. Pt reports no relief after evaluation or medications that were prescribed. Pt also has Hx of lung cancer.

## 2022-10-26 NOTE — ED Notes (Signed)
Respiratory contacted for incentive spirometer.

## 2022-10-27 ENCOUNTER — Telehealth: Payer: Self-pay

## 2022-10-27 ENCOUNTER — Other Ambulatory Visit: Payer: 59

## 2022-10-27 ENCOUNTER — Telehealth: Payer: Self-pay | Admitting: Pulmonary Disease

## 2022-10-27 ENCOUNTER — Ambulatory Visit: Payer: 59 | Admitting: Internal Medicine

## 2022-10-27 NOTE — Transitions of Care (Post Inpatient/ED Visit) (Signed)
   10/27/2022  Name: James Serrano MRN: [id number] DOB: 04/08/1966  Today's TOC FU Call Status: Today's TOC FU Call Status:: Successful TOC FU Call Competed TOC FU Call Complete Date: 10/27/22  Transition Care Management Follow-up Telephone Call Date of Discharge: 10/26/22 Discharge [facility]: [facility] Type of Discharge: Emergency Department Reason for ED Visit: Other: (mult fractures) How have you been since you were released from the hospital?: Same Any questions or concerns?: No  Items Reviewed: Did you receive and understand the discharge instructions provided?: Yes Medications obtained and verified?: Yes (Medications Reviewed) Any new allergies since your discharge?: No Dietary orders reviewed?: NA Do you have support at home?: Yes People in Home: spouse  Home Care and Equipment/Supplies: [place] Ordered?: NA Any new equipment or medical supplies ordered?: NA  Functional Questionnaire: Do you need assistance with bathing/showering or dressing?: Yes Do you need assistance with meal preparation?: No Do you need assistance with eating?: No Do you have difficulty maintaining continence: No Do you need assistance with getting out of bed/getting out of a chair/moving?: No Do you have difficulty managing or taking your medications?: No  Folllow up appointments reviewed: PCP Follow-up appointment confirmed?: [facility] Follow-up appointment confirmed?: NA Do you need transportation to your follow-up appointment?: No Do you understand care options if your condition(s) worsen?: Yes-patient verbalized understanding    [place], [place] Direct Dial [phone]

## 2022-10-27 NOTE — ED Provider Notes (Signed)
[facility] Provider Note   CSN: [id number] Arrival date & time: 10/26/22  0541     History  Chief Complaint  Patient presents with   Chest Pain    James Serrano is a 57 y.o. male.  Patient presented to the emergency department for evaluation of right-sided chest pain.  Patient reports a fall, landing on the right side of his chest causing the pain.  He reports having an outpatient x-ray that did not show any injury.  Patient reports increased pain, worsening with movement, coughing.       Home Medications Prior to Admission medications   Medication Sig Start Date End Date Taking? Authorizing Provider  albuterol (PROVENTIL) (2.5 MG/3ML) 0.083% nebulizer solution USE 1 VIAL IN NEBULIZER EVERY 6 HOURS AS NEEDED FOR WHEEZING OR SHORTNESS OF BREATH 09/27/22  Yes Rigoberto Noel, MD  albuterol (VENTOLIN HFA) 108 (90 Base) MCG/ACT inhaler Inhale 2 puffs into the lungs every 4 (four) hours as needed. 06/13/22  Yes Rigoberto Noel, MD  benzonatate (TESSALON PERLES) 100 MG capsule Take 1 capsule (100 mg total) by mouth 3 (three) times daily as needed for cough. 09/14/22  Yes Mayers, Cari S, PA-C  benzonatate (TESSALON) 200 MG capsule Take 200 mg by mouth 3 (three) times daily as needed. 10/18/22  Yes [provider]  Budeson-Glycopyrrol-Formoterol (BREZTRI AEROSPHERE) 160-9-4.8 MCG/ACT AERO Inhale 2 puffs into the lungs in the morning and at bedtime. 06/13/22  Yes Rigoberto Noel, MD  lidocaine (LIDODERM) 5 % Place 1 patch onto the skin daily. Remove & Discard patch within 12 hours or as directed by MD 10/24/22  Yes Eulogio Bear, NP  oxyCODONE-acetaminophen (PERCOCET) 5-325 MG tablet Take 1-2 tablets by mouth every 4 (four) hours as needed. 10/26/22  Yes Sindi Beckworth, Gwenyth Allegra, MD  predniSONE (DELTASONE) 20 MG tablet Take 2 tablets (40 mg total) by mouth daily with breakfast for 5 days. 10/24/22 10/29/22 Yes Eulogio Bear, NP   promethazine-dextromethorphan (PROMETHAZINE-DM) 6.25-15 MG/5ML syrup Take 5 mLs by mouth 4 (four) times daily as needed. 06/30/22  Yes [provider]  tiZANidine (ZANAFLEX) 4 MG tablet Take 1 tablet (4 mg total) by mouth every 8 (eight) hours as needed for muscle spasms. Do not take with alcohol or while driving or operating heavy machinery.  May cause drowsiness. 10/24/22  Yes Eulogio Bear, NP  alum & mag hydroxide-simeth (MAALOX/MYLANTA) 200-200-20 MG/5ML suspension Take 30 mLs by mouth every 6 (six) hours as needed for indigestion or heartburn.    [provider]  clonazePAM (KLONOPIN) 0.5 MG tablet Take 1 tablet by mouth twice daily as needed for anxiety 05/12/22   Kathyrn Drown, MD      Allergies    Patient has no known allergies.    Review of Systems   Review of Systems  Physical Exam Updated Vital Signs BP 136/74   Pulse 68   Temp 98.2 F (36.8 C)   Resp 19   Ht 6' (1.829 m)   Wt 109.8 kg   SpO2 99%   BMI 32.83 kg/m  Physical Exam Vitals and nursing note reviewed.  Constitutional:      General: He is not in acute distress.    Appearance: He is well-developed.  HENT:     Head: Normocephalic and atraumatic.     Mouth/Throat:     Mouth: Mucous membranes are moist.  Eyes:     General: Vision grossly intact. Gaze aligned  appropriately.     Extraocular Movements: Extraocular movements intact.     Conjunctiva/sclera: Conjunctivae normal.  Cardiovascular:     Rate and Rhythm: Normal rate and regular rhythm.     Pulses: Normal pulses.     Heart sounds: Normal heart sounds, S1 normal and S2 normal. No murmur heard.    No friction rub. No gallop.  Pulmonary:     Effort: Pulmonary effort is normal. No respiratory distress.     Breath sounds: Normal breath sounds.  Chest:     Chest wall: Tenderness present. No deformity or crepitus.  Abdominal:     Palpations: Abdomen is soft.     Tenderness: There is no abdominal tenderness. There is no guarding  or rebound.     Hernia: No hernia is present.  Musculoskeletal:        General: No swelling.     Cervical back: Full passive range of motion without pain, normal range of motion and neck supple. No pain with movement, spinous process tenderness or muscular tenderness. Normal range of motion.     Right lower leg: No edema.     Left lower leg: No edema.  Skin:    General: Skin is warm and dry.     Capillary Refill: Capillary refill takes less than 2 seconds.     Findings: No ecchymosis, erythema, lesion or wound.  Neurological:     Mental Status: He is alert and oriented to person, place, and time.     GCS: GCS eye subscore is 4. GCS verbal subscore is 5. GCS motor subscore is 6.     Cranial Nerves: Cranial nerves 2-12 are intact.     Sensory: Sensation is intact.     Motor: Motor function is intact. No weakness or abnormal muscle tone.     Coordination: Coordination is intact.  Psychiatric:        Mood and Affect: Mood normal.        Speech: Speech normal.        Behavior: Behavior normal.     ED Results / Procedures / Treatments   Labs (all labs ordered are listed, but only abnormal results are displayed) Labs Reviewed  CBC WITH DIFFERENTIAL/PLATELET - Abnormal; Notable for the following components:      Result Value   MCV 100.4 (*)    MCH 35.0 (*)    All other components within normal limits  COMPREHENSIVE METABOLIC PANEL - Abnormal; Notable for the following components:   Sodium 133 (*)    Calcium 8.6 (*)    All other components within normal limits  TROPONIN I (HIGH SENSITIVITY)    EKG EKG Interpretation  Date/Time:  Sunday October 26 2022 05:57:04 EST Ventricular Rate:  80 PR Interval:  160 QRS Duration: 99 QT Interval:  370 QTC Calculation: 427 R Axis:   87 Text Interpretation: Sinus rhythm Minimal ST elevation, inferior leads Confirmed by Orpah Greek [id number]) on 10/26/2022 6:52:47 AM  Radiology DG Ribs Bilateral W/Chest  Result Date:  10/26/2022 CLINICAL DATA:  Fall. EXAM: BILATERAL RIBS AND CHEST - 4 VIEW COMPARISON:  10/24/2022 FINDINGS: Anterolateral right fourth and sixth rib fractures. See oblique views. No hemothorax or pneumothorax. Normal heart size. Artifact from EKG leads. IMPRESSION: Anterior right fourth and sixth rib fractures.  No pneumothorax. Electronically Signed   By: Jorje Guild M.D.   On: 10/26/2022 06:39    Procedures Procedures    Medications Ordered in ED Medications  HYDROmorphone (DILAUDID) injection 1 mg (1 mg  Intravenous Given 10/26/22 0716)  ondansetron (ZOFRAN) injection 4 mg (4 mg Intravenous Given 10/26/22 0715)  ketorolac (TORADOL) 30 MG/ML injection 15 mg (15 mg Intravenous Given 10/26/22 0715)    ED Course/ Medical Decision Making/ A&P                             Medical Decision Making Amount and/or Complexity of Data Reviewed Labs: ordered. Radiology: ordered.  Risk Prescription drug management.   Presents with right-sided chest pain.  Differential diagnosis considered includes, but not limited to: Acute coronary syndrome, pneumonia, pleurisy, pneumothorax, rib fracture  Patient is very tender to deep palpation.  He has severe worsening of his pain with movement.  This is all after a fall with direct impact to the chest wall.  X-ray confirms at least 2 rib fractures.  No pneumothorax or pulmonary contusion.  This is the cause of the patient's pain, no cardiac involvement.  Discharged with analgesia, incentive spirometry.  Given return precautions.        Final Clinical Impression(s) / ED Diagnoses Final diagnoses:  Closed fracture of multiple ribs of right side, initial encounter    Rx / DC Orders ED Discharge Orders          Ordered    oxyCODONE-acetaminophen (PERCOCET) 5-325 MG tablet  Every 4 hours PRN        10/26/22 0704              Orpah Greek, MD 10/27/22 [phone]

## 2022-10-27 NOTE — Telephone Encounter (Signed)
Called and spoke with patient's wife [name]. She stated that the patient had a hard fall on Thursday after blacking out during a coughing spell. She took him to the UC in [place] on 03/01 where they told him he did not have any cracked or bruised ribs. His breathing got significantly worse Saturday night and she took him to the ED. They did a CXR at the ED and they told him he had 3 cracked ribs. They discharged him on prednisone '40mg'$  for 5 days and percocet.   She stated that he is still having a hard time with catching his breath. Due to the pain, he is unable to take deep breaths. She can tell he has some chest congestion but he is unable to cough up anything due to the pain.   She confirmed that he is still using his Breztri twice daily as well as the albuterol as needed. Denied any fevers.   Pharmacy is [facility] in [place].   Dr. Elsworth Soho, can you please advise? Thanks!

## 2022-10-27 NOTE — Telephone Encounter (Signed)
Called and spoke with [name]. She verbalized understanding. She is aware to call us if anything changes.   Nothing further needed at time of call.

## 2022-10-29 ENCOUNTER — Telehealth: Payer: Self-pay

## 2022-10-29 NOTE — Telephone Encounter (Signed)
Patient wife calling for patient , they have tried OTC remedies for constipation. Tried stool softener, miralax , dulcolax  ect  none have helped him. Last BM 5 days ago. Currently taking oxycodone and using lidocaine patches otc the prescription strength is not covered by ins. Is wrapping recommended for rib pain, please advise.

## 2022-10-29 NOTE — Telephone Encounter (Signed)
Oxycodone can cause significant constipation issues.  Sometimes individuals have to back off the use of oxycodone in order for the bowels to move better.  It would be wise for her to do a enema to try to help him.  She may do 1 enema-fleets enema.  If that does not get adequate response within 2 hours may do a second enema. I would also make sure that using proper amount of MiraLAX 1 capful in 8 ounces of water twice daily until having a bowel movement. May continue to also use Duca locks to try to stimulate bowels to move them.  If showing signs of bowel obstruction such as severe abdominal pain nausea vomiting then it would be wise to go to the emergency department. If ongoing issues or problems please alert [place] thank you

## 2022-10-29 NOTE — Telephone Encounter (Signed)
Called and left message for patient to call office.

## 2022-10-30 ENCOUNTER — Telehealth: Payer: Self-pay

## 2022-10-30 NOTE — Telephone Encounter (Signed)
Patient's wife called and advised per Dr Nicki Reaper Oxycodone can cause significant constipation issues.  Sometimes individuals have to back off the use of oxycodone in order for the bowels to move better.   It would be wise for her to do a enema to try to help him.  She may do 1 enema-fleets enema.  If that does not get adequate response within 2 hours may do a second enema. I would also make sure that using proper amount of MiraLAX 1 capful in 8 ounces of water twice daily until having a bowel movement. May continue to also use Duca locks to try to stimulate bowels to move them.   If showing signs of bowel obstruction such as severe abdominal pain nausea vomiting then it would be wise to go to the emergency department. If ongoing issues or problems please alert [place]. Wife verbalized understanding and would make the patient aware.

## 2022-10-31 ENCOUNTER — Ambulatory Visit: Payer: 59 | Admitting: Pulmonary Disease

## 2022-10-31 ENCOUNTER — Ambulatory Visit: Payer: 59

## 2022-10-31 ENCOUNTER — Ambulatory Visit (HOSPITAL_COMMUNITY)
Admission: RE | Admit: 2022-10-31 | Discharge: 2022-10-31 | Disposition: A | Discharge status: Home / Self Care | Payer: 59 | Source: Ambulatory Visit | Attending: Internal Medicine | Admitting: Internal Medicine

## 2022-10-31 DIAGNOSIS — C3490 Malignant neoplasm of unspecified part of unspecified bronchus or lung: Secondary | ICD-10-CM | POA: Insufficient documentation

## 2022-10-31 MED ORDER — IOHEXOL 300 MG/ML  SOLN
75.0000 mL | Freq: Once | INTRAMUSCULAR | Status: AC | PRN
  Administered 2022-10-31: 75 mL via INTRAVENOUS

## 2022-11-03 ENCOUNTER — Telehealth: Payer: Self-pay | Admitting: *Deleted

## 2022-11-03 ENCOUNTER — Other Ambulatory Visit: Payer: Self-pay | Admitting: Internal Medicine

## 2022-11-03 DIAGNOSIS — C3490 Malignant neoplasm of unspecified part of unspecified bronchus or lung: Secondary | ICD-10-CM

## 2022-11-03 NOTE — Telephone Encounter (Signed)
Patient's wife called. Patient's CT on Friday 3/8 did not happen - they couldn't find a vein. Needs new order so patient can r/s before he sees you on 3/18. Patient wife said she tried to r/s this morning, but Radiology told him CT needs to be reordered because order was cancelled/completed when they couldn't find vein. Dr. Julien Nordmann informed, CT ordered. Contacted patient/wife and LVM with info as above

## 2022-11-07 ENCOUNTER — Telehealth: Payer: Self-pay | Admitting: Pulmonary Disease

## 2022-11-07 MED ORDER — PREDNISONE 10 MG PO TABS: ORAL_TABLET | ORAL | 0 refills | Status: AC

## 2022-11-07 NOTE — Telephone Encounter (Signed)
Called and spoke with pt's spouse James Serrano who states that pt has been working out of town and has been around more humidity and pollen. Pt has been having problems with wheezing and also has had increased SOB.   Pt has had to use nebulizer at least three times a day. Has been using his inhalers which have not been helping like they  usually do.  Pt is also coughing and is getting up phlegm that is mostly clear but does have some green to it and it is worse in the mornings.  Due to his symptoms, James Serrano and pt are wanting to know if pt can have more prednisone prescribed to help with his symptoms. Dr. Elsworth Soho, please advise.

## 2022-11-07 NOTE — Telephone Encounter (Signed)
Pt. Wife calling pr. Is still sick and was wondering if could get more predniSONE  called into pharmacy he just had a fall and broke 2 ribs 2 weeks ago

## 2022-11-07 NOTE — Telephone Encounter (Signed)
Rigoberto Noel, MD  You4 minutes ago (3:35 PM)    Prednisone 10 mg tabs Take 4 tabs  daily with food x 4 days, then 3 tabs daily x 4 days, then 2 tabs daily x 4 days, then 1 tab daily x4 days then stop. #40   Please also ensure that he is taking OTC either Claritin or Zyrtec     Called and spoke with [name] letting her know that we were sending Rx for prednisone to pharmacy for pt and she verbalized understanding. Also told her other recs per RA. Nothing further needed.

## 2022-11-09 ENCOUNTER — Ambulatory Visit (HOSPITAL_BASED_OUTPATIENT_CLINIC_OR_DEPARTMENT_OTHER)
Admission: RE | Admit: 2022-11-09 | Discharge: 2022-11-09 | Disposition: A | Discharge status: Home / Self Care | Payer: 59 | Source: Ambulatory Visit | Attending: Internal Medicine | Admitting: Internal Medicine

## 2022-11-09 DIAGNOSIS — C3490 Malignant neoplasm of unspecified part of unspecified bronchus or lung: Secondary | ICD-10-CM | POA: Diagnosis present

## 2022-11-09 MED ORDER — IOHEXOL 300 MG/ML  SOLN
100.0000 mL | Freq: Once | INTRAMUSCULAR | Status: AC | PRN
  Administered 2022-11-09: 75 mL via INTRAVENOUS

## 2022-11-10 ENCOUNTER — Other Ambulatory Visit: Payer: Self-pay

## 2022-11-10 ENCOUNTER — Inpatient Hospital Stay: Payer: 59 | Attending: Nurse Practitioner

## 2022-11-10 ENCOUNTER — Inpatient Hospital Stay (HOSPITAL_BASED_OUTPATIENT_CLINIC_OR_DEPARTMENT_OTHER): Payer: 59 | Admitting: Internal Medicine

## 2022-11-10 VITALS — BP 137/92 | HR 74 | Temp 98.0°F | Resp 17 | Wt 241.0 lb

## 2022-11-10 DIAGNOSIS — C3412 Malignant neoplasm of upper lobe, left bronchus or lung: Secondary | ICD-10-CM | POA: Diagnosis present

## 2022-11-10 DIAGNOSIS — Z8616 Personal history of COVID-19: Secondary | ICD-10-CM | POA: Insufficient documentation

## 2022-11-10 DIAGNOSIS — K219 Gastro-esophageal reflux disease without esophagitis: Secondary | ICD-10-CM | POA: Insufficient documentation

## 2022-11-10 DIAGNOSIS — F17210 Nicotine dependence, cigarettes, uncomplicated: Secondary | ICD-10-CM | POA: Diagnosis not present

## 2022-11-10 DIAGNOSIS — J4489 Other specified chronic obstructive pulmonary disease: Secondary | ICD-10-CM | POA: Insufficient documentation

## 2022-11-10 DIAGNOSIS — J449 Chronic obstructive pulmonary disease, unspecified: Secondary | ICD-10-CM | POA: Insufficient documentation

## 2022-11-10 DIAGNOSIS — J45909 Unspecified asthma, uncomplicated: Secondary | ICD-10-CM | POA: Diagnosis not present

## 2022-11-10 DIAGNOSIS — J432 Centrilobular emphysema: Secondary | ICD-10-CM | POA: Diagnosis not present

## 2022-11-10 DIAGNOSIS — C3490 Malignant neoplasm of unspecified part of unspecified bronchus or lung: Secondary | ICD-10-CM

## 2022-11-10 DIAGNOSIS — Z923 Personal history of irradiation: Secondary | ICD-10-CM | POA: Insufficient documentation

## 2022-11-10 DIAGNOSIS — Z7952 Long term (current) use of systemic steroids: Secondary | ICD-10-CM | POA: Diagnosis not present

## 2022-11-10 DIAGNOSIS — D3502 Benign neoplasm of left adrenal gland: Secondary | ICD-10-CM | POA: Insufficient documentation

## 2022-11-10 LAB — CBC WITH DIFFERENTIAL (CANCER CENTER ONLY)
Abs Immature Granulocytes: 0.02 10*3/uL (ref 0.00–0.07)
Basophils Absolute: 0 10*3/uL (ref 0.0–0.1)
Basophils Relative: 1 %
Eosinophils Absolute: 0.3 10*3/uL (ref 0.0–0.5)
Eosinophils Relative: 6 %
HCT: 45.1 % (ref 39.0–52.0)
Hemoglobin: 15.7 g/dL (ref 13.0–17.0)
Immature Granulocytes: 0 %
Lymphocytes Relative: 13 %
Lymphs Abs: 0.7 10*3/uL (ref 0.7–4.0)
MCH: 34.7 pg — ABNORMAL HIGH (ref 26.0–34.0)
MCHC: 34.8 g/dL (ref 30.0–36.0)
MCV: 99.8 fL (ref 80.0–100.0)
Monocytes Absolute: 0.4 10*3/uL (ref 0.1–1.0)
Monocytes Relative: 7 %
Neutro Abs: 4.1 10*3/uL (ref 1.7–7.7)
Neutrophils Relative %: 73 %
Platelet Count: 206 10*3/uL (ref 150–400)
RBC: 4.52 MIL/uL (ref 4.22–5.81)
RDW: 12 % (ref 11.5–15.5)
WBC Count: 5.6 10*3/uL (ref 4.0–10.5)
nRBC: 0 % (ref 0.0–0.2)

## 2022-11-10 LAB — CMP (CANCER CENTER ONLY)
ALT: 15 U/L (ref 0–44)
AST: 18 U/L (ref 15–41)
Albumin: 4.1 g/dL (ref 3.5–5.0)
Alkaline Phosphatase: 73 U/L (ref 38–126)
Anion gap: 5 (ref 5–15)
BUN: 8 mg/dL (ref 6–20)
CO2: 30 mmol/L (ref 22–32)
Calcium: 9.2 mg/dL (ref 8.9–10.3)
Chloride: 102 mmol/L (ref 98–111)
Creatinine: 0.8 mg/dL (ref 0.61–1.24)
GFR, Estimated: >60 mL/min (ref >60)
Glucose, Bld: 97 mg/dL (ref 70–99)
Potassium: 4.3 mmol/L (ref 3.5–5.1)
Sodium: 137 mmol/L (ref 135–145)
Total Bilirubin: 0.5 mg/dL (ref 0.3–1.2)
Total Protein: 6.7 g/dL (ref 6.5–8.1)

## 2022-11-10 NOTE — Progress Notes (Signed)
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  PD-L1 expression is negative.   PRIOR THERAPY: Status post SBRT to the squamous cell carcinoma of the left upper lobe and right lower lobe under the care of Dr. Sondra Come completed on 01/07/2022  CURRENT THERAPY: Observation.  INTERVAL HISTORY: James Serrano 57 y.o. male returns to the clinic today for 4 months follow-up visit accompanied by his wife.  The patient is feeling fine today with no concerning complaints except for occasional pain in the right and left side of the chest from the subacute rib fractures.  He denied having any significant shortness of breath, but has cough with no hemoptysis.  He has no nausea, vomiting, diarrhea or constipation.  He has no headache or visual changes.  He has no recent weight loss or night sweats.  He is here today for evaluation with repeat CT scan of the chest for restaging of his disease.   MEDICAL HISTORY: Past Medical History:  Diagnosis Date   Anxiety    Asthma    Bronchitis    COPD (chronic obstructive pulmonary disease) ([facility])    COVID-19 08/2020   GERD (gastroesophageal reflux disease)    History of radiation therapy    Right chest 12/25/21-01/07/22- Dr. Gery Pray   History of radiation therapy    Left Lung- 12/25/21-01/07/22- Dr. Gery Pray   Lung cancer [facility]) 2023   Tobacco use     ALLERGIES:  has No Known Allergies.  MEDICATIONS:  Current Outpatient Medications  Medication Sig Dispense Refill   albuterol (PROVENTIL) (2.5 MG/3ML) 0.083% nebulizer solution USE 1 VIAL IN NEBULIZER EVERY 6 HOURS AS NEEDED FOR WHEEZING OR SHORTNESS OF BREATH 180 mL 5    albuterol (VENTOLIN HFA) 108 (90 Base) MCG/ACT inhaler Inhale 2 puffs into the lungs every 4 (four) hours as needed. 9 g 12   alum & mag hydroxide-simeth (MAALOX/MYLANTA) 200-200-20 MG/5ML suspension Take 30 mLs by mouth every 6 (six) hours as needed for indigestion or heartburn.     benzonatate (TESSALON PERLES) 100 MG capsule Take 1 capsule (100 mg total) by mouth 3 (three) times daily as needed for cough. 30 capsule 0   benzonatate (TESSALON) 200 MG capsule Take 200 mg by mouth 3 (three) times daily as needed.     Budeson-Glycopyrrol-Formoterol (BREZTRI AEROSPHERE) 160-9-4.8 MCG/ACT AERO Inhale 2 puffs into the lungs in the morning and at bedtime. 10.7 g 11   clonazePAM (KLONOPIN) 0.5 MG tablet Take 1 tablet by mouth twice daily as needed for anxiety 20 tablet 0   lidocaine (LIDODERM) 5 % Place 1 patch onto the skin daily. Remove & Discard patch within 12 hours or as directed by MD 30 patch 0   oxyCODONE-acetaminophen (PERCOCET) 5-325 MG tablet Take 1-2 tablets by mouth every 4 (four) hours as needed. 20 tablet 0   predniSONE (DELTASONE) 10 MG tablet Take 4 tablets (40 mg total) by mouth daily with breakfast for 4 days, THEN 3 tablets (30 mg total) daily with breakfast for 4 days, THEN 2 tablets (20 mg total) daily with breakfast for 4 days, THEN 1 tablet (10 mg total) daily with breakfast for 4 days. 40 tablet 0  promethazine-dextromethorphan (PROMETHAZINE-DM) 6.25-15 MG/5ML syrup Take 5 mLs by mouth 4 (four) times daily as needed.     tiZANidine (ZANAFLEX) 4 MG tablet Take 1 tablet (4 mg total) by mouth every 8 (eight) hours as needed for muscle spasms. Do not take with alcohol or while driving or operating heavy machinery.  May cause drowsiness. 30 tablet 0   No current facility-administered medications for this visit.    SURGICAL HISTORY:  Past Surgical History:  Procedure Laterality Date   BRONCHIAL BIOPSY  11/12/2021   Procedure: BRONCHIAL BIOPSIES;  Surgeon: [name], DO;   Location: [facility];  Service: Pulmonary;;   BRONCHIAL BRUSHINGS  11/12/2021   Procedure: BRONCHIAL BRUSHINGS;  Surgeon: [name], DO;  Location: [facility];  Service: Pulmonary;;   BRONCHIAL NEEDLE ASPIRATION BIOPSY  11/12/2021   Procedure: BRONCHIAL NEEDLE ASPIRATION BIOPSIES;  Surgeon: [name], DO;  Location: [facility];  Service: Pulmonary;;   ORIF ACETABULAR FRACTURE     TONSILLECTOMY      REVIEW OF SYSTEMS:  A comprehensive review of systems was negative except for: Respiratory: positive for pleurisy/chest pain   PHYSICAL EXAMINATION: General appearance: alert, cooperative, and no distress Head: Normocephalic, without obvious abnormality, atraumatic Neck: no adenopathy, no JVD, supple, symmetrical, trachea midline, and thyroid not enlarged, symmetric, no tenderness/mass/nodules Lymph nodes: Cervical, supraclavicular, and axillary nodes normal. Resp: clear to auscultation bilaterally Back: symmetric, no curvature. ROM normal. No CVA tenderness. Cardio: regular rate and rhythm, S1, S2 normal, no murmur, click, rub or gallop GI: soft, non-tender; bowel sounds normal; no masses,  no organomegaly Extremities: extremities normal, atraumatic, no cyanosis or edema  ECOG PERFORMANCE STATUS: 1 - Symptomatic but completely ambulatory  Blood pressure (!) 137/92, pulse 74, temperature 98 F (36.7 C), temperature source Oral, resp. rate 17, weight 241 lb (109.3 kg), SpO2 99 %.  LABORATORY DATA: Lab Results  Component Value Date   WBC 5.6 11/10/2022   HGB 15.7 11/10/2022   HCT 45.1 11/10/2022   MCV 99.8 11/10/2022   PLT 206 11/10/2022      Chemistry      Component Value Date/Time   NA 133 (L) 10/26/2022 0600   NA 139 03/16/2020 1010   K 4.0 10/26/2022 0600   CL 100 10/26/2022 0600   CO2 26 10/26/2022 0600   BUN 9 10/26/2022 0600   BUN 7 03/16/2020 1010   CREATININE 0.77 10/26/2022 0600   CREATININE 0.97 12/05/2021 1100      Component Value Date/Time    CALCIUM 8.6 (L) 10/26/2022 0600   ALKPHOS 59 10/26/2022 0600   AST 20 10/26/2022 0600   AST 18 12/05/2021 1100   ALT 17 10/26/2022 0600   ALT 16 12/05/2021 1100   BILITOT 0.9 10/26/2022 0600   BILITOT 0.7 12/05/2021 1100       RADIOGRAPHIC STUDIES: CT Chest W Contrast  Result Date: 11/09/2022 CLINICAL DATA:  Non-small-cell lung cancer. Restaging. * Tracking Code: BO * EXAM: CT CHEST WITH CONTRAST TECHNIQUE: Multidetector CT imaging of the chest was performed during intravenous contrast administration. RADIATION DOSE REDUCTION: This exam was performed according to the departmental dose-optimization program which includes automated exposure control, adjustment of the mA and/or kV according to patient size and/or use of iterative reconstruction technique. CONTRAST:  18mL OMNIPAQUE IOHEXOL 300 MG/ML  SOLN COMPARISON:  PET-CT 05/09/2022.  Chest CT 02/21/2022. FINDINGS: Cardiovascular: The heart size is normal. No substantial pericardial effusion. Coronary artery calcification is evident. Mild atherosclerotic calcification is noted in the wall of  the thoracic aorta. Mediastinum/Nodes: No mediastinal lymphadenopathy. There is no hilar lymphadenopathy. The esophagus has normal imaging features. There is no axillary lymphadenopathy. Lungs/Pleura: Centrilobular and paraseptal emphysema evident. Volume loss with bandlike opacity in the left upper lobe centrally and in the lingula additional scattered bilateral pulmonary nodules, most of which are subpleural are stable in the interval including one of the more dominant nodules in the left lower lobe measuring 7 mm on image 122/4. There is no new suspicious pulmonary nodule or mass. No focal airspace consolidation. No pleural effusion. Is stable in the interval, likely treatment related scarring. Index nodule measured previously in this region at 1.3 x 1.1 cm is 1.1 x 1.1 cm today on image 85/4. Upper Abdomen: Stable small left adrenal adenoma. No followup imaging  is recommended. Visualized upper abdomen otherwise unremarkable. Musculoskeletal: No worrisome lytic or sclerotic osseous abnormality. Subacute fractures are noted anterior right fourth through sixth ribs and anterior left fifth through seventh ribs. IMPRESSION: 1. No new or progressive findings to suggest recurrent or metastatic disease in the chest. 2. Subacute fractures anterior right fourth through sixth ribs and anterior left fifth through seventh ribs. No pneumothorax or pleural effusion. No focal airspace consolidation. 3. Stable small left adrenal adenoma. 4. Aortic Atherosclerosis (ICD10-I70.0) and Emphysema (ICD10-J43.9). Electronically Signed   By: Misty Stanley M.D.   On: 11/09/2022 17:45   DG Ribs Bilateral W/Chest  Result Date: 10/26/2022 CLINICAL DATA:  Fall. EXAM: BILATERAL RIBS AND CHEST - 4 VIEW COMPARISON:  10/24/2022 FINDINGS: Anterolateral right fourth and sixth rib fractures. See oblique views. No hemothorax or pneumothorax. Normal heart size. Artifact from EKG leads. IMPRESSION: Anterior right fourth and sixth rib fractures.  No pneumothorax. Electronically Signed   By: Jorje Guild M.D.   On: 10/26/2022 06:39   DG Chest 2 View  Result Date: 10/24/2022 CLINICAL DATA:  Cough EXAM: CHEST - 2 VIEW COMPARISON:  CXR 08/29/22 FINDINGS: No pleural effusion. No pneumothorax. Unchanged cardiac and mediastinal contours. Redemonstrated bilateral perihilar pulmonary opacities, not significantly changed from prior exam. No radiographically apparent displaced rib fractures. Visualized upper abdomen is unremarkable. Vertebral body heights are maintained. IMPRESSION: Redemonstrated bilateral perihilar pulmonary opacities, not significantly changed from prior exam. No new airspace opacity. Electronically Signed   By: Marin Roberts M.D.   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 The patient is status post SBRT under the care of Dr. Sondra Come. The patient is currently on observation and he is feeling fine with no concerning complaints. He had repeat CT scan of the chest performed recently.  I personally and independently reviewed the scan with the patient and his wife.  It showed no concerning findings for disease recurrence or metastasis.  He had the subacute fractures of the anterior right fourth through sixth ribs as well as anterior left fifth through seventh ribs likely from the cough or previous radiotherapy. I recommended for the patient to continue on observation with repeat CT scan of the chest in 6 months. For the shortness of breath and COPD, he will continue his routine follow-up visit and evaluation by Dr. Elsworth Soho. The patient was advised to call immediately if he has any concerning symptoms in the  interval. The patient voices understanding of current disease status and treatment options and is in agreement with the current care plan.  All questions were answered. The patient knows to call the clinic with any problems, questions or concerns. We can certainly see the patient much sooner if necessary.  The total time spent in the appointment was 20 minutes.  Disclaimer: This note was dictated with voice recognition software. Similar sounding words can inadvertently be transcribed and may not be corrected upon review.

## 2022-11-11 ENCOUNTER — Telehealth: Payer: Self-pay | Admitting: *Deleted

## 2022-11-11 ENCOUNTER — Encounter: Payer: Self-pay | Admitting: Internal Medicine

## 2022-11-11 DIAGNOSIS — R142 Eructation: Secondary | ICD-10-CM

## 2022-11-11 DIAGNOSIS — R140 Abdominal distension (gaseous): Secondary | ICD-10-CM

## 2022-11-11 DIAGNOSIS — R948 Abnormal results of function studies of other organs and systems: Secondary | ICD-10-CM

## 2022-11-11 DIAGNOSIS — Z1211 Encounter for screening for malignant neoplasm of colon: Secondary | ICD-10-CM

## 2022-11-11 NOTE — Telephone Encounter (Signed)
Dr. Bryan Lemma,  This pt has severe COPD and his procedure will need to be done at the hospital.  Thanks,  Osvaldo Angst

## 2022-11-13 NOTE — Telephone Encounter (Signed)
Called and left patient a detailed vm letting him know that we cancelled his upcoming procedures in the [place] due to severe COPD. I asked that patient give me a call back to discuss Dr. Vivia Ewing available hospital dates.   Wednesday, 12/03/22 at [facility] 9:15 am appt, arrive 7:45 am Tuesday, 12/30/22 at [facility] 9:45 am appt , arrive 8:15 am  [facility] procedures cancelled.

## 2022-11-13 NOTE — Telephone Encounter (Signed)
Patient's wife returned call, stated they could schedule her husband's procedure for Tuesday 12/30/22 at [facility] at 9:45 AM. She is inquiring if the prep medication can still be used for this procedure.

## 2022-11-14 ENCOUNTER — Other Ambulatory Visit: Payer: Self-pay

## 2022-11-14 ENCOUNTER — Encounter: Payer: Self-pay | Admitting: Gastroenterology

## 2022-11-14 DIAGNOSIS — R948 Abnormal results of function studies of other organs and systems: Secondary | ICD-10-CM

## 2022-11-14 DIAGNOSIS — Z1211 Encounter for screening for malignant neoplasm of colon: Secondary | ICD-10-CM

## 2022-11-14 DIAGNOSIS — R140 Abdominal distension (gaseous): Secondary | ICD-10-CM

## 2022-11-14 DIAGNOSIS — R142 Eructation: Secondary | ICD-10-CM

## 2022-11-14 NOTE — Telephone Encounter (Signed)
EGD/colon at [facility] on 12/30/22 at 9:45 am. Pt is to arrive at 8:15 am with a care partner.  Called and spoke with patient's wife. [name] is aware that patient can use his current prep. He will receive new instructions in the mail and via [place]. [name] verbalized understanding and had no concerns at the end of the call.  Ambulatory referral to GI in epic.  Updated instructions mailed and sent to patient via MyChart.

## 2022-11-17 ENCOUNTER — Encounter: Payer: 59 | Admitting: Gastroenterology

## 2022-11-25 ENCOUNTER — Ambulatory Visit (HOSPITAL_BASED_OUTPATIENT_CLINIC_OR_DEPARTMENT_OTHER): Payer: 59 | Admitting: Pulmonary Disease

## 2022-12-10 ENCOUNTER — Telehealth: Payer: Self-pay | Admitting: Gastroenterology

## 2022-12-10 NOTE — Telephone Encounter (Signed)
Called and spoke with patient's wife. They were requesting a Monday or Friday appt. I advised that Dr. Frankey Shown hospital dates fall on Thursday's. Pt's wife states that patient has a lot going on right now and she requested that we reschedule the EGD/colonoscopy on 02/12/23 at 9:45 am. I informed Toniann Fail that appt has been rescheduled. Toniann Fail is aware that I will mail and send updated instructions to patient via MyChart. Toniann Fail verbalized understanding and had no concerns at the end of the call.

## 2022-12-10 NOTE — Telephone Encounter (Signed)
Inbound call from patient spouse inquiring if patient can be rescheduled for a Friday procedure due to his work schedule. Please advise.  Thank you

## 2022-12-17 ENCOUNTER — Other Ambulatory Visit: Payer: Self-pay | Admitting: Family Medicine

## 2022-12-19 ENCOUNTER — Other Ambulatory Visit: Payer: Self-pay | Admitting: Family Medicine

## 2022-12-20 ENCOUNTER — Encounter: Payer: Self-pay | Admitting: Family Medicine

## 2022-12-20 ENCOUNTER — Telehealth: Payer: 59 | Admitting: Family

## 2022-12-20 DIAGNOSIS — J441 Chronic obstructive pulmonary disease with (acute) exacerbation: Secondary | ICD-10-CM | POA: Diagnosis not present

## 2022-12-21 MED ORDER — PREDNISONE 10 MG (21) PO TBPK: ORAL_TABLET | ORAL | 0 refills | Status: DC

## 2022-12-21 NOTE — Progress Notes (Signed)
E-Visit for Cough  We are sorry that you are not feeling well.  Here is how we plan to help!  Based on your presentation I believe you most likely have A cough due to a virus.  This is called viral bronchitis and is best treated by rest, plenty of fluids and control of the cough.  You may use Ibuprofen or Tylenol as directed to help your symptoms.     In addition you may use A non-prescription cough medication called Mucinex DM: take 2 tablets every 12 hours.  Prednisone 10 mg daily for 6 days (see taper instructions below)  Directions for 6 day taper: Day 1: 2 tablets before breakfast, 1 after both lunch & dinner and 2 at bedtime Day 2: 1 tab before breakfast, 1 after both lunch & dinner and 2 at bedtime Day 3: 1 tab at each meal & 1 at bedtime Day 4: 1 tab at breakfast, 1 at lunch, 1 at bedtime Day 5: 1 tab at breakfast & 1 tab at bedtime Day 6: 1 tab at breakfast  From your responses in the eVisit questionnaire you describe inflammation in the upper respiratory tract which is causing a significant cough.  This is commonly called Bronchitis and has four common causes:   Allergies Viral Infections Acid Reflux Bacterial Infection Allergies, viruses and acid reflux are treated by controlling symptoms or eliminating the cause. An example might be a cough caused by taking certain blood pressure medications. You stop the cough by changing the medication. Another example might be a cough caused by acid reflux. Controlling the reflux helps control the cough.  USE OF BRONCHODILATOR ("RESCUE") INHALERS: There is a risk from using your bronchodilator too frequently.  The risk is that over-reliance on a medication which only relaxes the muscles surrounding the breathing tubes can reduce the effectiveness of medications prescribed to reduce swelling and congestion of the tubes themselves.  Although you feel brief relief from the bronchodilator inhaler, your asthma may actually be worsening with the  tubes becoming more swollen and filled with mucus.  This can delay other crucial treatments, such as oral steroid medications. If you need to use a bronchodilator inhaler daily, several times per day, you should discuss this with your provider.  There are probably better treatments that could be used to keep your asthma under control.     HOME CARE Only take medications as instructed by your medical team. Complete the entire course of an antibiotic. Drink plenty of fluids and get plenty of rest. Avoid close contacts especially the very young and the elderly Cover your mouth if you cough or cough into your sleeve. Always remember to wash your hands A steam or ultrasonic humidifier can help congestion.   GET HELP RIGHT AWAY IF: You develop worsening fever. You become short of breath You cough up blood. Your symptoms persist after you have completed your treatment plan MAKE SURE YOU  Understand these instructions. Will watch your condition. Will get help right away if you are not doing well or get worse.    Thank you for choosing an e-visit.  Your e-visit answers were reviewed by a board certified advanced clinical practitioner to complete your personal care plan. Depending upon the condition, your plan could have included both over the counter or prescription medications.  Please review your pharmacy choice. Make sure the pharmacy is open so you can pick up prescription now. If there is a problem, you may contact your provider through [employer] and  have the prescription routed to another pharmacy.  Your safety is important to [place]. If you have drug allergies check your prescription carefully.   For the next 24 hours you can use MyChart to ask questions about today's visit, request a non-urgent call back, or ask for a work or school excuse. You will get an email in the next two days asking about your experience. I hope that your e-visit has been valuable and will speed your  recovery.  Approximately 5 minutes was spent documenting and reviewing patient's chart.

## 2022-12-22 ENCOUNTER — Other Ambulatory Visit: Payer: Self-pay | Admitting: Family Medicine

## 2022-12-22 MED ORDER — PROMETHAZINE-DM 6.25-15 MG/5ML PO SYRP: 5.0000 mL | ORAL_SOLUTION | Freq: Four times a day (QID) | ORAL | 4 refills | Status: DC | PRN

## 2023-01-02 ENCOUNTER — Encounter (HOSPITAL_BASED_OUTPATIENT_CLINIC_OR_DEPARTMENT_OTHER): Payer: Self-pay | Admitting: Pulmonary Disease

## 2023-01-02 ENCOUNTER — Ambulatory Visit (INDEPENDENT_AMBULATORY_CARE_PROVIDER_SITE_OTHER): Payer: 59 | Admitting: Pulmonary Disease

## 2023-01-02 VITALS — BP 122/80 | HR 92 | Ht 72.0 in | Wt 238.4 lb

## 2023-01-02 DIAGNOSIS — J432 Centrilobular emphysema: Secondary | ICD-10-CM

## 2023-01-02 DIAGNOSIS — C3490 Malignant neoplasm of unspecified part of unspecified bronchus or lung: Secondary | ICD-10-CM | POA: Diagnosis not present

## 2023-01-02 DIAGNOSIS — Z720 Tobacco use: Secondary | ICD-10-CM | POA: Diagnosis not present

## 2023-01-02 MED ORDER — PREDNISONE 10 MG PO TABS: ORAL_TABLET | ORAL | 0 refills | Status: AC

## 2023-01-02 MED ORDER — NICOTINE 10 MG IN INHA: 1.0000 | RESPIRATORY_TRACT | 2 refills | Status: DC | PRN

## 2023-01-02 NOTE — Assessment & Plan Note (Signed)
We will treat him as an exacerbation likely related to pollen and sinus drip. Course of prednisone starting at 40 mg and taper over 2 weeks. He will continue on Breztri.  He will use albuterol nebs as needed. For sinus drip I asked him to take chlorpheniramine/phenylephrine combination for 2 to 3 weeks

## 2023-01-02 NOTE — Progress Notes (Signed)
   Subjective:    Patient ID: James Serrano, male    DOB: 08-15-1966, 57 y.o.   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 PET  showed hypermetabolism in this left upper lobe nodule with SUV 10.8, there was some activity of mesenteric vessels also noted Unfortunately, bronchoscopy showed an additional lesion in the right lower lobe which was also squamous cancer.  He underwent SBRT to the left upper lobe lung nodule in addition to ultra hypofractionated treatment to the right lower lobe    Family history of lung cancer in his dad, brother died of sarcoma metastatic to lungs, mom has an unknown kind of cancer and also had asthma.  Chief Complaint  Patient presents with   Follow-up    Pt states he feels like his breathing has become a little worse compared to last viist.    Accompanied by his wife.  He reports increased dyspnea and coughing. He reports persistent sinus drip.  He has tried Zyrtec OTC. We reviewed oncology consultation and CT scan from 10/2022 which does not show any evidence of recurrent disease or metastasis. He has been compliant with Markus Daft We discussed possibility of disability  Significant tests/ events reviewed  PET 04/2022 reviewed, hypermetabolism has decreased, left upper lobe nodule has decreased in size from 2 cm to 1 cm, no activity on the right.  Some activity around the anal area which was noted back in February and in the nasopharynx     11/12/21 video bronchoscopy with robotic assisted bronchoscopic navigation.  He had biopsy of the left upper lobe lung nodule but there was also a right lower lobe endobronchial lesion      LDCT 08/2021 Dominant 19.7 mm central left upper lobe irregular pulmonary nodule   PFTs 09/13/2021  severe airway obstruction, ratio 48, FEV1 1.35/34%, FVC 53%, TLC 1 1 3%, DLCO 92%/27.7 , postbronchodilator FEV1 was 1.47  Review of Systems neg for any significant sore throat, dysphagia, itching, sneezing, nasal congestion or excess/ purulent secretions, fever, chills, sweats, unintended wt loss, pleuritic or exertional cp, hempoptysis, orthopnea pnd or change in chronic leg swelling. Also denies presyncope, palpitations, heartburn, abdominal pain, nausea, vomiting, diarrhea or change in bowel or urinary habits, dysuria,hematuria, rash, arthralgias, visual complaints, headache, numbness weakness or ataxia.     Objective:   Physical Exam  Gen. Pleasant, well-nourished, in no distress ENT - no thrush, no pallor/icterus,no post nasal drip Neck: No JVD, no thyromegaly, no carotid bruits Lungs: no use of accessory muscles, no dullness to percussion, clear without rales or rhonchi  Cardiovascular: Rhythm regular, heart sounds  normal, no murmurs or gallops, no peripheral edema Musculoskeletal: No deformities, no cyanosis or clubbing        Assessment & Plan:

## 2023-01-02 NOTE — Assessment & Plan Note (Signed)
Smoking cessation was encouraged. He has been prescribed Nicotrol inhaler but has not used this yet.  I encouraged him to set a quit date

## 2023-01-02 NOTE — Patient Instructions (Signed)
X Prednisone 10 mg tabs Take 4 tabs  daily with food x 4 days, then 3 tabs daily x 4 days, then 2 tabs daily x 4 days, then 1 tab daily x4 days then stop. #40  Trial of chlorpheniramine 4 mg at bedtime  + phenylephrine 10 mg  (combination [employer] brand 'sinus PE') x3 weeks  Take INSTEAD of zyrtec  Try nicotrol inhaler to help you quit

## 2023-01-02 NOTE — Assessment & Plan Note (Signed)
No evidence of recurrence on recent CT chest

## 2023-01-05 ENCOUNTER — Telehealth: Payer: Self-pay

## 2023-01-05 NOTE — Telephone Encounter (Signed)
PA request received via CMM for Nicotrol 10MG  inhalers  PA submitted to [facility] and is pending determination  *patient tried bupropion and patches (caused nausea/vomiting)  Key: [id number]

## 2023-01-07 NOTE — Telephone Encounter (Signed)
PA has been APPROVED from 01/05/2023-01/05/2024

## 2023-01-24 ENCOUNTER — Telehealth: Payer: 59 | Admitting: Physician Assistant

## 2023-01-24 DIAGNOSIS — R051 Acute cough: Secondary | ICD-10-CM | POA: Diagnosis not present

## 2023-01-24 MED ORDER — PREDNISONE 20 MG PO TABS: 40.0000 mg | ORAL_TABLET | Freq: Every day | ORAL | 0 refills | Status: AC

## 2023-01-24 NOTE — Progress Notes (Signed)
E-Visit for Cough  We are sorry that you are not feeling well.  Here is how we plan to help!  Based on your presentation I believe you most likely have A cough due to a virus.  This is called viral bronchitis and is best treated by rest, plenty of fluids and control of the cough.  You may use Ibuprofen or Tylenol as directed to help your symptoms.      I have sent in a short course of prednisone.   From your responses in the eVisit questionnaire you describe inflammation in the upper respiratory tract which is causing a significant cough.  This is commonly called Bronchitis and has four common causes:   Allergies Viral Infections Acid Reflux Bacterial Infection Allergies, viruses and acid reflux are treated by controlling symptoms or eliminating the cause. An example might be a cough caused by taking certain blood pressure medications. You stop the cough by changing the medication. Another example might be a cough caused by acid reflux. Controlling the reflux helps control the cough.  USE OF BRONCHODILATOR ("RESCUE") INHALERS: There is a risk from using your bronchodilator too frequently.  The risk is that over-reliance on a medication which only relaxes the muscles surrounding the breathing tubes can reduce the effectiveness of medications prescribed to reduce swelling and congestion of the tubes themselves.  Although you feel brief relief from the bronchodilator inhaler, your asthma may actually be worsening with the tubes becoming more swollen and filled with mucus.  This can delay other crucial treatments, such as oral steroid medications. If you need to use a bronchodilator inhaler daily, several times per day, you should discuss this with your provider.  There are probably better treatments that could be used to keep your asthma under control.     HOME CARE Only take medications as instructed by your medical team. Complete the entire course of an antibiotic. Drink plenty of fluids and get  plenty of rest. Avoid close contacts especially the very young and the elderly Cover your mouth if you cough or cough into your sleeve. Always remember to wash your hands A steam or ultrasonic humidifier can help congestion.   GET HELP RIGHT AWAY IF: You develop worsening fever. You become short of breath You cough up blood. Your symptoms persist after you have completed your treatment plan MAKE SURE YOU  Understand these instructions. Will watch your condition. Will get help right away if you are not doing well or get worse.    Thank you for choosing an e-visit.  Your e-visit answers were reviewed by a board certified advanced clinical practitioner to complete your personal care plan. Depending upon the condition, your plan could have included both over the counter or prescription medications.  Please review your pharmacy choice. Make sure the pharmacy is open so you can pick up prescription now. If there is a problem, you may contact your provider through [employer] and have the prescription routed to another pharmacy.  Your safety is important to [place]. If you have drug allergies check your prescription carefully.   For the next 24 hours you can use MyChart to ask questions about today's visit, request a non-urgent call back, or ask for a work or school excuse. You will get an email in the next two days asking about your experience. I hope that your e-visit has been valuable and will speed your recovery.  I have spent 5 minutes in review of e-visit questionnaire, review and updating patient chart, medical  decision making and response to patient.   Tylene Fantasia Ward, PA-C

## 2023-02-03 ENCOUNTER — Encounter (HOSPITAL_COMMUNITY): Payer: Self-pay | Admitting: Gastroenterology

## 2023-02-05 ENCOUNTER — Telehealth: Payer: Self-pay | Admitting: Gastroenterology

## 2023-02-05 NOTE — Telephone Encounter (Signed)
Patients wife called to reschedule the patients hospital procedure.

## 2023-02-05 NOTE — Telephone Encounter (Signed)
Tele Previsit with nurse is scheduled on 03/20/23 at 3:30 PM. Steward Drone is aware.

## 2023-02-05 NOTE — Telephone Encounter (Signed)
Spoke with patient's wife Steward Drone. Patient's [facility] hospital colonoscopy is rescheduled on 8/12 at 10:15 with Dr. Barron Alvine from 6/20.

## 2023-02-12 ENCOUNTER — Encounter (HOSPITAL_COMMUNITY): Payer: Self-pay

## 2023-02-12 ENCOUNTER — Ambulatory Visit (HOSPITAL_COMMUNITY): Admit: 2023-02-12 | Payer: 59 | Admitting: Gastroenterology

## 2023-02-12 SURGERY — ESOPHAGOGASTRODUODENOSCOPY (EGD) WITH PROPOFOL
Anesthesia: Monitor Anesthesia Care

## 2023-02-15 ENCOUNTER — Telehealth: Payer: 59 | Admitting: Family

## 2023-02-15 DIAGNOSIS — J432 Centrilobular emphysema: Secondary | ICD-10-CM

## 2023-02-15 MED ORDER — PREDNISONE 10 MG (21) PO TBPK
ORAL_TABLET | ORAL | 0 refills | Status: DC
Start: 2023-02-15 — End: 2023-03-13

## 2023-02-15 NOTE — Progress Notes (Signed)
E-Visit for Cough  We are sorry that you are not feeling well.  Here is how we plan to help!  Based on your presentation I believe you most likely have A cough due to a virus.  This is called viral bronchitis and is best treated by rest, plenty of fluids and control of the cough.  You may use Ibuprofen or Tylenol as directed to help your symptoms.     In addition you may use A non-prescription cough medication called Robitussin DAC. Take 2 teaspoons every 8 hours or Delsym: take 2 teaspoons every 12 hours. and A non-prescription cough medication called Mucinex DM: take 2 tablets every 12 hours.  Prednisone 10 mg daily for 6 days (see taper instructions below)  Directions for 6 day taper: Day 1: 2 tablets before breakfast, 1 after both lunch & dinner and 2 at bedtime Day 2: 1 tab before breakfast, 1 after both lunch & dinner and 2 at bedtime Day 3: 1 tab at each meal & 1 at bedtime Day 4: 1 tab at breakfast, 1 at lunch, 1 at bedtime Day 5: 1 tab at breakfast & 1 tab at bedtime Day 6: 1 tab at breakfast  From your responses in the eVisit questionnaire you describe inflammation in the upper respiratory tract which is causing a significant cough.  This is commonly called Bronchitis and has four common causes:   Allergies Viral Infections Acid Reflux Bacterial Infection Allergies, viruses and acid reflux are treated by controlling symptoms or eliminating the cause. An example might be a cough caused by taking certain blood pressure medications. You stop the cough by changing the medication. Another example might be a cough caused by acid reflux. Controlling the reflux helps control the cough.  USE OF BRONCHODILATOR ("RESCUE") INHALERS: There is a risk from using your bronchodilator too frequently.  The risk is that over-reliance on a medication which only relaxes the muscles surrounding the breathing tubes can reduce the effectiveness of medications prescribed to reduce swelling and congestion  of the tubes themselves.  Although you feel brief relief from the bronchodilator inhaler, your asthma may actually be worsening with the tubes becoming more swollen and filled with mucus.  This can delay other crucial treatments, such as oral steroid medications. If you need to use a bronchodilator inhaler daily, several times per day, you should discuss this with your provider.  There are probably better treatments that could be used to keep your asthma under control.     HOME CARE Only take medications as instructed by your medical team. Complete the entire course of an antibiotic. Drink plenty of fluids and get plenty of rest. Avoid close contacts especially the very young and the elderly Cover your mouth if you cough or cough into your sleeve. Always remember to wash your hands A steam or ultrasonic humidifier can help congestion.   GET HELP RIGHT AWAY IF: You develop worsening fever. You become short of breath You cough up blood. Your symptoms persist after you have completed your treatment plan MAKE SURE YOU  Understand these instructions. Will watch your condition. Will get help right away if you are not doing well or get worse.    Thank you for choosing an e-visit.  Your e-visit answers were reviewed by a board certified advanced clinical practitioner to complete your personal care plan. Depending upon the condition, your plan could have included both over the counter or prescription medications.  Please review your pharmacy choice. Make sure the pharmacy is  open so you can pick up prescription now. If there is a problem, you may contact your provider through [employer] and have the prescription routed to another pharmacy.  Your safety is important to [place]. If you have drug allergies check your prescription carefully.   For the next 24 hours you can use MyChart to ask questions about today's visit, request a non-urgent call back, or ask for a work or school excuse. You will  get an email in the next two days asking about your experience. I hope that your e-visit has been valuable and will speed your recovery.   Approximately 5 minutes was spent documenting and reviewing patient's chart.

## 2023-02-23 ENCOUNTER — Telehealth: Payer: 59 | Admitting: Physician Assistant

## 2023-02-23 ENCOUNTER — Encounter (HOSPITAL_BASED_OUTPATIENT_CLINIC_OR_DEPARTMENT_OTHER): Payer: Self-pay | Admitting: Pulmonary Disease

## 2023-02-23 ENCOUNTER — Other Ambulatory Visit: Payer: Self-pay | Admitting: Pulmonary Disease

## 2023-02-23 DIAGNOSIS — J432 Centrilobular emphysema: Secondary | ICD-10-CM

## 2023-02-23 NOTE — Progress Notes (Signed)
Because you have had multiple flares in the last month, I feel your condition warrants further evaluation and I recommend that you be seen in a face to face visit.  I did see you are currently in [place], [place]. I would recommend for you to see a local Urgent Care facility in [place].    NOTE: There will be NO CHARGE for this eVisit   If you are having a true medical emergency please call 911.      For an urgent face to face visit, [place] has eight urgent care centers for your convenience:   NEW!! [facility] Urgent Care Center at [facility] Get Driving Directions [phone] [address] [place], [address]    [facility] Urgent Care Center at [facility] Get Driving Directions [phone] [address] [place], [place] [address]   [facility] Health Urgent Care Center [facility]) Get Driving Directions [phone] [address] [place], [place] [address]  [facility] Health Urgent Care Center [facility] [place]) Get Driving Directions [phone] [address] [place],  [place]  [address]  [facility] Health Urgent Care Center [facility] - at [employer]  [phone] [address] [place],  [place] [address]   [facility] Health Urgent Care at [facility] Get Driving Directions [phone] [address] [place], [place] [address]   [facility] Health Urgent Care at [facility] Get Driving Directions  [phone] [address] [place], [place] [address]   [facility] Health Urgent Care at [facility] Directions [phone] [address] [place], [place] [address]  Your MyChart E-visit questionnaire answers were reviewed by a board certified advanced clinical practitioner to complete your personal care plan based on your specific symptoms.  Thank you for using e-Visits.    I have spent 5 minutes in review of e-visit  questionnaire, review and updating patient chart, medical decision making and response to patient.   Margaretann Loveless, PA-C

## 2023-02-26 ENCOUNTER — Encounter (HOSPITAL_BASED_OUTPATIENT_CLINIC_OR_DEPARTMENT_OTHER): Payer: Self-pay | Admitting: Pulmonary Disease

## 2023-02-27 ENCOUNTER — Other Ambulatory Visit: Payer: Self-pay | Admitting: Pulmonary Disease

## 2023-02-27 ENCOUNTER — Telehealth: Payer: Self-pay | Admitting: *Deleted

## 2023-02-27 ENCOUNTER — Other Ambulatory Visit: Payer: Self-pay

## 2023-02-27 MED ORDER — ALBUTEROL SULFATE (2.5 MG/3ML) 0.083% IN NEBU: INHALATION_SOLUTION | RESPIRATORY_TRACT | 5 refills | Status: DC

## 2023-02-27 MED ORDER — ALBUTEROL SULFATE HFA 108 (90 BASE) MCG/ACT IN AERS: 2.0000 | INHALATION_SPRAY | RESPIRATORY_TRACT | 12 refills | Status: DC | PRN

## 2023-02-27 NOTE — Telephone Encounter (Signed)
Patient's spouse called to get a refill of Proventil and would like to discuss increasing dosage. Patient's spouse states he has been working out of town. Please call and advise patient. Patient uses [facility].

## 2023-02-27 NOTE — Telephone Encounter (Signed)
Lm for patient's spouse, Wendy(DPR).

## 2023-02-27 NOTE — Telephone Encounter (Signed)
Called and spoke w/ pt wife pt is needing a refill of the albuterol neb. They are also wondering if they increase the dosage due to the pts work environment.  Neb has been refilled. Waiting on recommendations about dosage/usage from DOD. Routing to AO for further review.

## 2023-02-27 NOTE — Telephone Encounter (Signed)
Refills for albuterol sent to pharmacy  There is no need for increased dosage Albuterol to be used as needed  Continue to use [place] daily

## 2023-03-02 NOTE — Telephone Encounter (Signed)
Called and spoke with patients wife, Toniann Fail (on [place]).  Gave information per Dr. Wynona Neat.  Toniann Fail states [place] is out of town on business and cannot come in for [employer].  Informed to tell patient to go to nearest urgent care or emergency room if sx worsen.  Patients wife verbalized understanding.  She will relay information to patient.

## 2023-03-05 ENCOUNTER — Encounter (HOSPITAL_BASED_OUTPATIENT_CLINIC_OR_DEPARTMENT_OTHER): Payer: Self-pay | Admitting: Pulmonary Disease

## 2023-03-05 ENCOUNTER — Encounter: Payer: Self-pay | Admitting: Pulmonary Disease

## 2023-03-05 MED ORDER — DOXYCYCLINE HYCLATE 100 MG PO TABS: 100.0000 mg | ORAL_TABLET | Freq: Two times a day (BID) | ORAL | 0 refills | Status: DC

## 2023-03-05 MED ORDER — PREDNISONE 10 MG PO TABS: ORAL_TABLET | ORAL | 0 refills | Status: DC

## 2023-03-05 NOTE — Telephone Encounter (Signed)
See other mychart encounter dated today.

## 2023-03-05 NOTE — Telephone Encounter (Signed)
Oretha Milch, MD  Christen Butter, CMA  Breztri has 3 medications combined -please confirm that he is taking 2 puffs twice daily Albuterol is the only rescue medication why nebulizer when he is on [place] Since he is out of town, I would advise  -Prednisone 10 mg tabs Take 4 tabs  daily with food x 4 days, then 3 tabs daily x 4 days, then 2 tabs daily x 4 days, then 1 tab daily x4 days then stop. #40  -Doxycycline 100 mg twice daily for 7 days  Once he gets back in town, please ask him to make appointment with me/APP so we can discuss alternative to [facility] as his maintenance especially if he wants more nebulizer type medications       I called and spoke with the pt's spouse and notified of response per Dr Vassie Loll  Pt scheduled for appt with RA in [place] 03/13/23 Meds sent in to preferred pharm  Nothing further needed

## 2023-03-05 NOTE — Telephone Encounter (Signed)
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  No Known Allergies

## 2023-03-06 MED ORDER — BENZONATATE 200 MG PO CAPS: 200.0000 mg | ORAL_CAPSULE | Freq: Three times a day (TID) | ORAL | 0 refills | Status: DC | PRN

## 2023-03-13 ENCOUNTER — Ambulatory Visit (INDEPENDENT_AMBULATORY_CARE_PROVIDER_SITE_OTHER): Payer: 59 | Admitting: Pulmonary Disease

## 2023-03-13 ENCOUNTER — Encounter: Payer: Self-pay | Admitting: Pulmonary Disease

## 2023-03-13 VITALS — BP 132/88 | HR 98 | Ht 72.0 in | Wt 242.0 lb

## 2023-03-13 DIAGNOSIS — C3490 Malignant neoplasm of unspecified part of unspecified bronchus or lung: Secondary | ICD-10-CM

## 2023-03-13 DIAGNOSIS — J432 Centrilobular emphysema: Secondary | ICD-10-CM

## 2023-03-13 DIAGNOSIS — Z720 Tobacco use: Secondary | ICD-10-CM | POA: Diagnosis not present

## 2023-03-13 MED ORDER — PREDNISONE 10 MG PO TABS: ORAL_TABLET | ORAL | 0 refills | Status: DC

## 2023-03-13 NOTE — Assessment & Plan Note (Signed)
6 month FU CT chest in sep scheduled by onc

## 2023-03-13 NOTE — Progress Notes (Signed)
   Subjective:    Patient ID: James Serrano, male    DOB: 1966/07/02, 57 y.o.   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 PET  showed hypermetabolism in this left upper lobe nodule with SUV 10.8, there was some activity of mesenteric vessels also noted Unfortunately, bronchoscopy showed an additional lesion in the right lower lobe which was also squamous cancer.  He underwent SBRT to the left upper lobe lung nodule in addition to ultra hypofractionated treatment to the right lower lobe    Family history of lung cancer in his dad, brother died of sarcoma metastatic to lungs, mom has an unknown kind of cancer and also had asthma.  21-month follow-up visit. On last office visit, we gave him a prednisone taper and CPM/PSE combination for sinus drip  He continues to smoke. We got nicotro approved but he has not started He was working in [place]. He had a flare up with his copd he was wheezing, coughing a lot short winded .    Significant tests/ events reviewed   PET 04/2022 reviewed, hypermetabolism has decreased, left upper lobe nodule has decreased in size from 2 cm to 1 cm, no activity on the right.  Some activity around the anal area which was noted back in February and in the nasopharynx     11/12/21 video bronchoscopy with robotic assisted bronchoscopic navigation.  He had biopsy of the left upper lobe lung nodule but there was also a right lower lobe endobronchial lesion      LDCT 08/2021 Dominant 19.7 mm central left upper lobe irregular pulmonary nodule   PFTs 09/13/2021 severe airway obstruction, ratio 48, FEV1 1.35/34%, FVC 53%, TLC 1 1 3%, DLCO 92%/27.7 , postbronchodilator FEV1 was 1.47  Review of Systems neg for any significant sore throat,  dysphagia, itching, sneezing, nasal congestion or excess/ purulent secretions, fever, chills, sweats, unintended wt loss, pleuritic or exertional cp, hempoptysis, orthopnea pnd or change in chronic leg swelling. Also denies presyncope, palpitations, heartburn, abdominal pain, nausea, vomiting, diarrhea or change in bowel or urinary habits, dysuria,hematuria, rash, arthralgias, visual complaints, headache, numbness weakness or ataxia.     Objective:   Physical Exam  Gen. Pleasant, well-nourished, in no distress ENT - no thrush, no pallor/icterus,no post nasal drip Neck: No JVD, no thyromegaly, no carotid bruits Lungs: no use of accessory muscles, no dullness to percussion, decreased BL without rales or rhonchi  Cardiovascular: Rhythm regular, heart sounds  normal, no murmurs or gallops, no peripheral edema Musculoskeletal: No deformities, no cyanosis or clubbing        Assessment & Plan:

## 2023-03-13 NOTE — Patient Instructions (Signed)
Rx for Prednisone 10 mg tabs Take 4 tabs  daily with food x 4 days, then 3 tabs daily x 4 days, then 2 tabs daily x 4 days, then 1 tab daily x4 days then stop. #40

## 2023-03-13 NOTE — Assessment & Plan Note (Signed)
Smoking cessation encouraged Asked him to start nicotrol inhaler, he is hesitant for some reason

## 2023-03-13 NOTE — Assessment & Plan Note (Signed)
Continue breztri Seems to be worsened by exposure at work Use albuterol for rescue Will provide him prednisone Rx to use when he is away at work

## 2023-03-18 ENCOUNTER — Telehealth: Payer: Self-pay | Admitting: Gastroenterology

## 2023-03-18 NOTE — Telephone Encounter (Signed)
Inbound call from patients wife stating that she needed to cancel patients PV and colonoscopy due to patient being out of town for work and not sure when he will be back. PV was canceled. Procedure is scheduled for 8/12 with Dr. Barron Alvine at the hospital. Patients wife stated they will call back when he is back in town to reschedule. Please advise.

## 2023-03-25 ENCOUNTER — Telehealth: Payer: Self-pay | Admitting: Internal Medicine

## 2023-03-25 NOTE — Progress Notes (Signed)
Called patient and wife answered. Asked to speak with patient regarding procedure and the phone call need. Attempted to call back with no answer.

## 2023-03-25 NOTE — Telephone Encounter (Signed)
Called patient regarding September appointments, patient is notified.

## 2023-04-02 ENCOUNTER — Other Ambulatory Visit: Payer: Self-pay | Admitting: Pulmonary Disease

## 2023-04-02 MED ORDER — BENZONATATE 200 MG PO CAPS: 200.0000 mg | ORAL_CAPSULE | Freq: Three times a day (TID) | ORAL | 0 refills | Status: DC | PRN

## 2023-04-02 NOTE — Telephone Encounter (Signed)
Patient requesting Tessalon.  Last refill 03/06/23, #30, 0 rf  Please advise, thank you!

## 2023-04-06 ENCOUNTER — Ambulatory Visit (HOSPITAL_COMMUNITY): Admission: RE | Admit: 2023-04-06 | Payer: 59 | Source: Home / Self Care | Admitting: Gastroenterology

## 2023-04-06 ENCOUNTER — Encounter (HOSPITAL_COMMUNITY): Admission: RE | Payer: Self-pay | Source: Home / Self Care

## 2023-04-06 SURGERY — ESOPHAGOGASTRODUODENOSCOPY (EGD) WITH PROPOFOL
Anesthesia: Monitor Anesthesia Care

## 2023-04-30 ENCOUNTER — Telehealth: Payer: Self-pay | Admitting: *Deleted

## 2023-04-30 NOTE — Telephone Encounter (Signed)
Patient's wife called and states that he is still coughing quite a bit and would like to get more tessalon pearls.   Patient's wife James Serrano also states that he has been coughing up some "black stuff", but denies bright red blood, fever/chills  Patient would like a prescription sent over to [facility]. Please call and advise patient, thanks!

## 2023-04-30 NOTE — Telephone Encounter (Signed)
Is this okay to refill ,please advise

## 2023-05-01 ENCOUNTER — Other Ambulatory Visit: Payer: Self-pay

## 2023-05-01 MED ORDER — BENZONATATE 200 MG PO CAPS: 200.0000 mg | ORAL_CAPSULE | Freq: Three times a day (TID) | ORAL | 0 refills | Status: DC | PRN

## 2023-05-01 NOTE — Telephone Encounter (Signed)
Called and spoke w/ patient 's wife to  let her know that his medication as been sent to [facility] in [place]

## 2023-05-11 ENCOUNTER — Inpatient Hospital Stay: Payer: 59 | Attending: Internal Medicine

## 2023-05-11 ENCOUNTER — Ambulatory Visit (HOSPITAL_COMMUNITY)
Admission: RE | Admit: 2023-05-11 | Discharge: 2023-05-11 | Disposition: A | Discharge status: Home / Self Care | Payer: 59 | Source: Ambulatory Visit | Attending: Internal Medicine | Admitting: Internal Medicine

## 2023-05-11 DIAGNOSIS — Z923 Personal history of irradiation: Secondary | ICD-10-CM | POA: Insufficient documentation

## 2023-05-11 DIAGNOSIS — J4489 Other specified chronic obstructive pulmonary disease: Secondary | ICD-10-CM | POA: Insufficient documentation

## 2023-05-11 DIAGNOSIS — C3490 Malignant neoplasm of unspecified part of unspecified bronchus or lung: Secondary | ICD-10-CM | POA: Diagnosis present

## 2023-05-11 DIAGNOSIS — Z85118 Personal history of other malignant neoplasm of bronchus and lung: Secondary | ICD-10-CM | POA: Insufficient documentation

## 2023-05-11 LAB — CBC WITH DIFFERENTIAL (CANCER CENTER ONLY)
Abs Immature Granulocytes: 0.02 10*3/uL (ref 0.00–0.07)
Basophils Absolute: 0 10*3/uL (ref 0.0–0.1)
Basophils Relative: 0 %
Eosinophils Absolute: 0.3 10*3/uL (ref 0.0–0.5)
Eosinophils Relative: 6 %
HCT: 48.8 % (ref 39.0–52.0)
Hemoglobin: 16.6 g/dL (ref 13.0–17.0)
Immature Granulocytes: 0 %
Lymphocytes Relative: 18 %
Lymphs Abs: 0.8 10*3/uL (ref 0.7–4.0)
MCH: 34.4 pg — ABNORMAL HIGH (ref 26.0–34.0)
MCHC: 34 g/dL (ref 30.0–36.0)
MCV: 101 fL — ABNORMAL HIGH (ref 80.0–100.0)
Monocytes Absolute: 0.4 10*3/uL (ref 0.1–1.0)
Monocytes Relative: 9 %
Neutro Abs: 3.2 10*3/uL (ref 1.7–7.7)
Neutrophils Relative %: 67 %
Platelet Count: 174 10*3/uL (ref 150–400)
RBC: 4.83 MIL/uL (ref 4.22–5.81)
RDW: 12.6 % (ref 11.5–15.5)
WBC Count: 4.8 10*3/uL (ref 4.0–10.5)
nRBC: 0 % (ref 0.0–0.2)

## 2023-05-11 LAB — CMP (CANCER CENTER ONLY)
ALT: 13 U/L (ref 0–44)
AST: 18 U/L (ref 15–41)
Albumin: 4.1 g/dL (ref 3.5–5.0)
Alkaline Phosphatase: 62 U/L (ref 38–126)
Anion gap: 4 — ABNORMAL LOW (ref 5–15)
BUN: 8 mg/dL (ref 6–20)
CO2: 32 mmol/L (ref 22–32)
Calcium: 9.6 mg/dL (ref 8.9–10.3)
Chloride: 103 mmol/L (ref 98–111)
Creatinine: 0.96 mg/dL (ref 0.61–1.24)
GFR, Estimated: >60 mL/min (ref >60)
Glucose, Bld: 99 mg/dL (ref 70–99)
Potassium: 4.8 mmol/L (ref 3.5–5.1)
Sodium: 139 mmol/L (ref 135–145)
Total Bilirubin: 0.6 mg/dL (ref 0.3–1.2)
Total Protein: 6.8 g/dL (ref 6.5–8.1)

## 2023-05-11 MED ORDER — IOHEXOL 300 MG/ML  SOLN
75.0000 mL | Freq: Once | INTRAMUSCULAR | Status: AC | PRN
  Administered 2023-05-11: 75 mL via INTRAVENOUS

## 2023-05-13 ENCOUNTER — Ambulatory Visit: Payer: 59 | Admitting: Internal Medicine

## 2023-05-14 ENCOUNTER — Inpatient Hospital Stay (HOSPITAL_BASED_OUTPATIENT_CLINIC_OR_DEPARTMENT_OTHER): Payer: 59 | Admitting: Internal Medicine

## 2023-05-14 ENCOUNTER — Encounter: Payer: Self-pay | Admitting: Internal Medicine

## 2023-05-14 VITALS — BP 119/77 | HR 91 | Temp 98.2°F | Resp 17 | Ht 72.0 in | Wt 247.2 lb

## 2023-05-14 DIAGNOSIS — Z85118 Personal history of other malignant neoplasm of bronchus and lung: Secondary | ICD-10-CM | POA: Diagnosis present

## 2023-05-14 DIAGNOSIS — Z923 Personal history of irradiation: Secondary | ICD-10-CM | POA: Diagnosis not present

## 2023-05-14 DIAGNOSIS — C3490 Malignant neoplasm of unspecified part of unspecified bronchus or lung: Secondary | ICD-10-CM | POA: Diagnosis not present

## 2023-05-14 DIAGNOSIS — J4489 Other specified chronic obstructive pulmonary disease: Secondary | ICD-10-CM | POA: Diagnosis not present

## 2023-05-14 NOTE — Progress Notes (Signed)
[facility] [phone]   [phone]  OFFICE PROGRESS NOTE  Babs Sciara, MD 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  PD-L1 expression is negative.   PRIOR THERAPY: Status post SBRT to the squamous cell carcinoma of the left upper lobe and right lower lobe under the care of Dr. Roselind Messier completed on 01/07/2022  CURRENT THERAPY: Observation.  INTERVAL HISTORY: James Serrano 57 y.o. male returns to clinic today for follow-up visit accompanied by his wife, Toniann Fail.  The patient is feeling fine today with no concerning complaints except for the mild shortness of breath with exertion and cough productive of blackish sputum.  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  He denied having any current chest pain or hemoptysis.  He has no nausea, vomiting, diarrhea or constipation.  He has no headache or visual changes.  He has no recent weight loss or night sweats.  His wife was recently diagnosed with renal cell carcinoma and she is currently undergoing adjuvant immunotherapy under my care.  He is here today for evaluation with repeat CT scan of the chest for restaging of his disease.   MEDICAL HISTORY: Past Medical History:  Diagnosis Date   Anxiety    Asthma    Bronchitis    COPD (chronic obstructive pulmonary disease) (HCC)    COVID-19 08/2020   GERD (gastroesophageal reflux disease)    History of radiation therapy    Right chest 12/25/21-01/07/22- Dr. Antony Blackbird   History of radiation therapy    Left Lung- 12/25/21-01/07/22- Dr. Antony Blackbird   Lung cancer [facility]) 2023   Tobacco use     ALLERGIES:  has No Known Allergies.  MEDICATIONS:  Current Outpatient Medications  Medication Sig Dispense Refill   albuterol (PROVENTIL)  (2.5 MG/3ML) 0.083% nebulizer solution USE 1 VIAL IN NEBULIZER EVERY 6 HOURS AS NEEDED FOR WHEEZING OR SHORTNESS OF BREATH 180 mL 5   albuterol (VENTOLIN HFA) 108 (90 Base) MCG/ACT inhaler Inhale 2 puffs into the lungs every 4 (four) hours as needed. 9 g 12   alum & mag hydroxide-simeth (MAALOX/MYLANTA) 200-200-20 MG/5ML suspension Take 30 mLs by mouth every 6 (six) hours as needed for indigestion or heartburn.     benzonatate (TESSALON) 200 MG capsule Take 1 capsule (200 mg total) by mouth 3 (three) times daily as needed. 30 capsule 0   Budeson-Glycopyrrol-Formoterol (BREZTRI AEROSPHERE) 160-9-4.8 MCG/ACT AERO Inhale 2 puffs into the lungs in the morning and at bedtime. 10.7 g 11   clonazePAM (KLONOPIN) 0.5 MG tablet Take 1 tablet by mouth twice daily as needed for anxiety 20 tablet 0   nicotine (NICOTROL) 10 MG inhaler Inhale 1 Cartridge (1 continuous puffing total) into the lungs as needed for smoking cessation. 42 each 2   predniSONE (DELTASONE) 10 MG tablet ake 4 tabs  daily with food x 4 days, then 3 tabs daily x 4 days, then 2 tabs daily x 4 days, then 1 tab daily x4 days then stop. 40 tablet 0   promethazine-dextromethorphan (PROMETHAZINE-DM) 6.25-15 MG/5ML syrup Take 5 mLs by mouth 4 (four) times daily as needed. 118 mL 4   tiZANidine (ZANAFLEX) 4 MG tablet Take 1 tablet (4 mg total) by mouth every 8 (eight) hours as needed for muscle  spasms. Do not take with alcohol or while driving or operating heavy machinery.  May cause drowsiness. 30 tablet 0   No current facility-administered medications for this visit.    SURGICAL HISTORY:  Past Surgical History:  Procedure Laterality Date   BRONCHIAL BIOPSY  11/12/2021   Procedure: BRONCHIAL BIOPSIES;  Surgeon: Josephine Igo, DO;  Location: [facility];  Service: Pulmonary;;   BRONCHIAL BRUSHINGS  11/12/2021   Procedure: BRONCHIAL BRUSHINGS;  Surgeon: Josephine Igo, DO;  Location: [facility];  Service: Pulmonary;;   BRONCHIAL NEEDLE  ASPIRATION BIOPSY  11/12/2021   Procedure: BRONCHIAL NEEDLE ASPIRATION BIOPSIES;  Surgeon: Josephine Igo, DO;  Location: [facility];  Service: Pulmonary;;   ORIF ACETABULAR FRACTURE     TONSILLECTOMY      REVIEW OF SYSTEMS:  A comprehensive review of systems was negative except for: Respiratory: positive for cough and dyspnea on exertion   PHYSICAL EXAMINATION: General appearance: alert, cooperative, and no distress Head: Normocephalic, without obvious abnormality, atraumatic Neck: no adenopathy, no JVD, supple, symmetrical, trachea midline, and thyroid not enlarged, symmetric, no tenderness/mass/nodules Lymph nodes: Cervical, supraclavicular, and axillary nodes normal. Resp: clear to auscultation bilaterally Back: symmetric, no curvature. ROM normal. No CVA tenderness. Cardio: regular rate and rhythm, S1, S2 normal, no murmur, click, rub or gallop GI: soft, non-tender; bowel sounds normal; no masses,  no organomegaly Extremities: extremities normal, atraumatic, no cyanosis or edema  ECOG PERFORMANCE STATUS: 1 - Symptomatic but completely ambulatory  Blood pressure 119/77, pulse 91, temperature 98.2 F (36.8 C), temperature source Oral, resp. rate 17, height 6' (1.829 m), weight 247 lb 3.2 oz (112.1 kg), SpO2 99%.  LABORATORY DATA: Lab Results  Component Value Date   WBC 4.8 05/11/2023   HGB 16.6 05/11/2023   HCT 48.8 05/11/2023   MCV 101.0 (H) 05/11/2023   PLT 174 05/11/2023      Chemistry      Component Value Date/Time   NA 139 05/11/2023 0949   NA 139 03/16/2020 1010   K 4.8 05/11/2023 0949   CL 103 05/11/2023 0949   CO2 32 05/11/2023 0949   BUN 8 05/11/2023 0949   BUN 7 03/16/2020 1010   CREATININE 0.96 05/11/2023 0949      Component Value Date/Time   CALCIUM 9.6 05/11/2023 0949   ALKPHOS 62 05/11/2023 0949   AST 18 05/11/2023 0949   ALT 13 05/11/2023 0949   BILITOT 0.6 05/11/2023 0949       RADIOGRAPHIC STUDIES: No results found.  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 The patient is status post SBRT under the care of Dr. Roselind Messier. The patient is currently on observation and he is feeling fine with no concerning complaints except for the baseline shortness of breath and mild cough. He had repeat CT scan of the chest performed few days ago but the final report is still pending.  I personally and independently reviewed the scan images and discussed the results with the patient and his wife.  I do not see any concerning findings for disease recurrence or metastasis but I will wait for the final report for confirmation. I will see him back for follow-up visit in 6 months for evaluation with repeat CT scan  of the chest for restaging of his disease. For the shortness of breath and COPD, he will continue his routine follow-up visit and evaluation by Dr. Vassie Loll. He was advised to call immediately if he has any other concerning symptoms in the interval. The patient voices understanding of current disease status and treatment options and is in agreement with the current care plan.  All questions were answered. The patient knows to call the clinic with any problems, questions or concerns. We can certainly see the patient much sooner if necessary.  The total time spent in the appointment was 20 minutes.  Disclaimer: This note was dictated with voice recognition software. Similar sounding words can inadvertently be transcribed and may not be corrected upon review.

## 2023-05-15 ENCOUNTER — Other Ambulatory Visit: Payer: Self-pay | Admitting: Internal Medicine

## 2023-05-15 ENCOUNTER — Telehealth: Payer: Self-pay | Admitting: Internal Medicine

## 2023-05-15 DIAGNOSIS — C3490 Malignant neoplasm of unspecified part of unspecified bronchus or lung: Secondary | ICD-10-CM

## 2023-05-18 ENCOUNTER — Telehealth: Payer: Self-pay | Admitting: Medical Oncology

## 2023-05-18 NOTE — Telephone Encounter (Signed)
Asking for path report. They know pt has changes on CT scan "Interval enlargement of perihilar nodule ...".Wife confirmed PET scan appt and f/u.

## 2023-05-21 ENCOUNTER — Telehealth: Payer: Self-pay | Admitting: Pulmonary Disease

## 2023-05-21 ENCOUNTER — Other Ambulatory Visit (HOSPITAL_BASED_OUTPATIENT_CLINIC_OR_DEPARTMENT_OTHER): Payer: Self-pay | Admitting: Pulmonary Disease

## 2023-05-21 MED ORDER — DOXYCYCLINE HYCLATE 100 MG PO TABS: 100.0000 mg | ORAL_TABLET | Freq: Two times a day (BID) | ORAL | 0 refills | Status: DC

## 2023-05-21 MED ORDER — PREDNISONE 10 MG PO TABS: ORAL_TABLET | ORAL | 0 refills | Status: DC

## 2023-05-21 NOTE — Telephone Encounter (Signed)
PT's wife calling. States her husband is wheezing and will need a Pred refill and poss antibx. He has a history of this and Dr. Vassie Loll usually calls in something.   Her # is [phone]  Pharm: [facility] in [place]

## 2023-05-21 NOTE — Telephone Encounter (Signed)
Spoke with the pt's spouse  Pt is c/o increased SOB, cough with green sputum and wheezing over the past 3-4 days  He denies any fevers, aches  Is using his breztri as directed along with albuterol inhaler and nebs  Continues to smoke  He is scheduled for Dr. Reginia Naas next opening 07/17/23  Refused for me to look for sooner appt with APP and wants something called in   Please advise, thanks!  No Known Allergies

## 2023-05-21 NOTE — Telephone Encounter (Signed)
Sent in prescriptions to [facility] pharmacy for Doxycycline 100 twice daily for 7 days Prednisone 10 mg tabs Take 4 tabs  daily with food x 4 days, then 3 tabs daily x 4 days, then 2 tabs daily x 4 days, then 1 tab daily x4 days then stop. #40

## 2023-05-22 NOTE — Telephone Encounter (Signed)
Patient's spouse, Wendy(DPR) is aware of below message/recommendations and voiced her understanding.  Nothing further needed.

## 2023-05-25 ENCOUNTER — Encounter (HOSPITAL_COMMUNITY)
Admission: RE | Admit: 2023-05-25 | Discharge: 2023-05-25 | Disposition: A | Discharge status: Home / Self Care | Payer: 59 | Source: Ambulatory Visit | Attending: Internal Medicine | Admitting: Internal Medicine

## 2023-05-25 DIAGNOSIS — C3490 Malignant neoplasm of unspecified part of unspecified bronchus or lung: Secondary | ICD-10-CM | POA: Insufficient documentation

## 2023-05-25 LAB — GLUCOSE, CAPILLARY: Glucose-Capillary: 103 mg/dL — ABNORMAL HIGH (ref 70–99)

## 2023-05-25 MED ORDER — FLUDEOXYGLUCOSE F - 18 (FDG) INJECTION
12.3000 | Freq: Once | INTRAVENOUS | Status: AC
  Administered 2023-05-25: 12.3 via INTRAVENOUS

## 2023-05-29 ENCOUNTER — Other Ambulatory Visit: Payer: Self-pay | Admitting: Family Medicine

## 2023-05-29 DIAGNOSIS — Z1211 Encounter for screening for malignant neoplasm of colon: Secondary | ICD-10-CM

## 2023-05-29 DIAGNOSIS — Z1212 Encounter for screening for malignant neoplasm of rectum: Secondary | ICD-10-CM

## 2023-06-02 ENCOUNTER — Encounter: Payer: Self-pay | Admitting: Internal Medicine

## 2023-06-04 ENCOUNTER — Ambulatory Visit: Payer: 59 | Admitting: Internal Medicine

## 2023-06-09 ENCOUNTER — Telehealth: Payer: Self-pay | Admitting: Medical Oncology

## 2023-06-09 NOTE — Telephone Encounter (Signed)
Wife asking for Ar[place]us Ped to sign death certificate.She said the ME told her that the last provider  that saw him needs to sign off on the death certificate.Marland Kitchen  LVM at [facility] to return my call to confirm above.

## 2023-06-10 ENCOUNTER — Telehealth: Payer: Self-pay

## 2023-06-10 ENCOUNTER — Telehealth: Payer: Self-pay | Admitting: Pulmonary Disease

## 2023-06-10 NOTE — Telephone Encounter (Signed)
TC from Lupita Leash at [facility] called to request signing of pt's death certificate. Hillis Range, RN following up with [facility].

## 2023-06-10 NOTE — Telephone Encounter (Signed)
Pt wife called in to get Dr. Vassie Loll to sign off on his Death Certificate also wants to ask Aundra Millet if the lung trouble he had was the cause of his death

## 2023-06-12 NOTE — Telephone Encounter (Signed)
Routing to Darilyn to check to see if there is a death certificate showing up for pt in [facility] system.

## 2023-06-26 DEATH — deceased

## 2023-07-17 ENCOUNTER — Ambulatory Visit (HOSPITAL_BASED_OUTPATIENT_CLINIC_OR_DEPARTMENT_OTHER): Payer: 59 | Admitting: Pulmonary Disease

## 2024-03-22 NOTE — Telephone Encounter (Signed)
 SABRA
